# Patient Record
Sex: Female | Born: 1965 | Race: White | Marital: Single
Health system: Northeastern US, Community
[De-identification: ages and names within clinical notes are randomized; demographics above are authoritative.]

## PROBLEM LIST (undated history)

## (undated) DIAGNOSIS — F419 Anxiety disorder, unspecified: Secondary | ICD-10-CM

## (undated) DIAGNOSIS — R51 Headache: Secondary | ICD-10-CM

## (undated) DIAGNOSIS — R102 Pelvic and perineal pain: Secondary | ICD-10-CM

## (undated) DIAGNOSIS — R519 Headache, unspecified: Secondary | ICD-10-CM

## (undated) DIAGNOSIS — J45909 Unspecified asthma, uncomplicated: Secondary | ICD-10-CM

## (undated) DIAGNOSIS — T7840XA Allergy, unspecified, initial encounter: Secondary | ICD-10-CM

## (undated) HISTORY — PX: CHOLECYSTECTOMY: SHX55

## (undated) HISTORY — DX: Allergy, unspecified, initial encounter: T78.40XA

## (undated) HISTORY — PX: CERVICAL BIOPSY  W/ LOOP ELECTRODE EXCISION: SUR135

## (undated) HISTORY — PX: PELVIC LAPAROSCOPY: SHX162

---

## 2000-01-31 ENCOUNTER — Other Ambulatory Visit: Admission: RE | Admit: 2000-01-31 | Discharge: 2000-01-31 | Payer: Self-pay | Admitting: Gynecology

## 2001-02-28 ENCOUNTER — Other Ambulatory Visit: Admission: RE | Admit: 2001-02-28 | Discharge: 2001-02-28 | Payer: Self-pay | Admitting: Gynecology

## 2002-11-23 ENCOUNTER — Inpatient Hospital Stay (HOSPITAL_COMMUNITY): Admission: EM | Admit: 2002-11-23 | Discharge: 2002-11-27 | Payer: Self-pay | Admitting: Internal Medicine

## 2002-11-23 ENCOUNTER — Encounter: Payer: Self-pay | Admitting: Internal Medicine

## 2002-11-25 ENCOUNTER — Encounter: Payer: Self-pay | Admitting: Internal Medicine

## 2002-12-06 ENCOUNTER — Encounter: Payer: Self-pay | Admitting: Internal Medicine

## 2002-12-06 ENCOUNTER — Ambulatory Visit (HOSPITAL_COMMUNITY): Admission: RE | Admit: 2002-12-06 | Discharge: 2002-12-06 | Payer: Self-pay | Admitting: Internal Medicine

## 2002-12-06 ENCOUNTER — Inpatient Hospital Stay (HOSPITAL_COMMUNITY): Admission: AD | Admit: 2002-12-06 | Discharge: 2002-12-09 | Payer: Self-pay | Admitting: Internal Medicine

## 2002-12-09 ENCOUNTER — Encounter: Payer: Self-pay | Admitting: Internal Medicine

## 2003-01-21 ENCOUNTER — Other Ambulatory Visit: Admission: RE | Admit: 2003-01-21 | Discharge: 2003-01-21 | Payer: Self-pay | Admitting: Obstetrics and Gynecology

## 2003-01-21 ENCOUNTER — Encounter: Payer: Self-pay | Admitting: Internal Medicine

## 2003-01-21 ENCOUNTER — Ambulatory Visit (HOSPITAL_COMMUNITY): Admission: RE | Admit: 2003-01-21 | Discharge: 2003-01-21 | Payer: Self-pay | Admitting: Internal Medicine

## 2004-03-11 ENCOUNTER — Other Ambulatory Visit: Admission: RE | Admit: 2004-03-11 | Discharge: 2004-03-11 | Payer: Self-pay | Admitting: Gynecology

## 2005-03-24 ENCOUNTER — Other Ambulatory Visit: Admission: RE | Admit: 2005-03-24 | Discharge: 2005-03-24 | Payer: Self-pay | Admitting: Gynecology

## 2005-05-05 ENCOUNTER — Ambulatory Visit (HOSPITAL_COMMUNITY): Admission: RE | Admit: 2005-05-05 | Discharge: 2005-05-05 | Payer: Self-pay | Admitting: Gynecology

## 2005-05-05 ENCOUNTER — Encounter (INDEPENDENT_AMBULATORY_CARE_PROVIDER_SITE_OTHER): Payer: Self-pay | Admitting: Specialist

## 2005-05-05 ENCOUNTER — Ambulatory Visit (HOSPITAL_BASED_OUTPATIENT_CLINIC_OR_DEPARTMENT_OTHER): Admission: RE | Admit: 2005-05-05 | Discharge: 2005-05-05 | Payer: Self-pay | Admitting: Gynecology

## 2006-02-28 ENCOUNTER — Ambulatory Visit (HOSPITAL_COMMUNITY): Admission: RE | Admit: 2006-02-28 | Discharge: 2006-02-28 | Payer: Self-pay | Admitting: Gynecology

## 2006-03-16 ENCOUNTER — Encounter: Admission: RE | Admit: 2006-03-16 | Discharge: 2006-03-16 | Payer: Self-pay | Admitting: Gynecology

## 2006-03-27 ENCOUNTER — Other Ambulatory Visit: Admission: RE | Admit: 2006-03-27 | Discharge: 2006-03-27 | Payer: Self-pay | Admitting: Gynecology

## 2006-07-18 ENCOUNTER — Encounter: Admission: RE | Admit: 2006-07-18 | Discharge: 2006-07-18 | Payer: Self-pay | Admitting: Internal Medicine

## 2006-08-07 ENCOUNTER — Encounter: Admission: RE | Admit: 2006-08-07 | Discharge: 2006-08-07 | Payer: Self-pay | Admitting: Internal Medicine

## 2006-09-12 ENCOUNTER — Ambulatory Visit (HOSPITAL_COMMUNITY): Admission: RE | Admit: 2006-09-12 | Discharge: 2006-09-12 | Payer: Self-pay | Admitting: Surgery

## 2006-09-12 ENCOUNTER — Encounter (INDEPENDENT_AMBULATORY_CARE_PROVIDER_SITE_OTHER): Payer: Self-pay | Admitting: *Deleted

## 2007-01-12 ENCOUNTER — Ambulatory Visit (HOSPITAL_COMMUNITY): Admission: RE | Admit: 2007-01-12 | Discharge: 2007-01-12 | Payer: Self-pay | Admitting: Gynecology

## 2007-05-03 ENCOUNTER — Other Ambulatory Visit: Admission: RE | Admit: 2007-05-03 | Discharge: 2007-05-03 | Payer: Self-pay | Admitting: Gynecology

## 2008-06-05 ENCOUNTER — Ambulatory Visit: Payer: Self-pay | Admitting: Gynecology

## 2008-07-29 ENCOUNTER — Ambulatory Visit: Payer: Self-pay | Admitting: Gynecology

## 2008-07-29 ENCOUNTER — Other Ambulatory Visit: Admission: RE | Admit: 2008-07-29 | Discharge: 2008-07-29 | Payer: Self-pay | Admitting: Gynecology

## 2008-07-29 ENCOUNTER — Encounter: Payer: Self-pay | Admitting: Gynecology

## 2008-10-01 ENCOUNTER — Ambulatory Visit: Payer: Self-pay | Admitting: Gynecology

## 2009-01-14 ENCOUNTER — Ambulatory Visit: Payer: Self-pay | Admitting: Gynecology

## 2009-03-17 ENCOUNTER — Ambulatory Visit: Payer: Self-pay | Admitting: Gynecology

## 2009-07-21 ENCOUNTER — Ambulatory Visit: Payer: Self-pay | Admitting: Gynecology

## 2009-12-21 ENCOUNTER — Ambulatory Visit: Payer: Self-pay | Admitting: Gynecology

## 2009-12-30 ENCOUNTER — Ambulatory Visit: Payer: Self-pay | Admitting: Gynecology

## 2010-10-17 ENCOUNTER — Encounter: Payer: Self-pay | Admitting: Gynecology

## 2011-02-11 NOTE — Op Note (Signed)
[name], [name] NO.:  [id number]   MEDICAL RECORD NO.:  [id number]          PATIENT TYPE:  AMB   LOCATION:  DAY                          FACILITY:  [facility]   PHYSICIAN:  Ardeth Sportsman, MD     DATE OF BIRTH:  1965-11-01   DATE OF PROCEDURE:  09/12/2006  DATE OF DISCHARGE:                               OPERATIVE REPORT   SURGEON:  Ardeth Sportsman, M.D.   ASSISTANT:  Lebron Conners, M.D.   PRIMARY CARE PHYSICIAN:  Olene Craven, M.D.   PREOPERATIVE DIAGNOSIS:  Biliary dyskinesia.   POSTOPERATIVE DIAGNOSIS:  Biliary dyskinesia.   OPERATION/PROCEDURE:  Laparoscopic cholecystectomy.   SPECIMENS:  Gallbladder.   DRAINS:  None.   ESTIMATED BLOOD LOSS:  Minimal.   COMPLICATIONS:  None.   ANESTHESIA:  1. General.  2. Local anesthetic (0.25% bupivacaine without epinephrine) in a field      block around all port sites.   INDICATIONS:  [name] is a 45 year old female with classic story  for biliary colic and evidence of decreased gallbladder ejection  fraction on HIDA scan but no stones on ultrasound.  The anatomy and  physiology of hepatobiliary and pancreatic function was discussed.  Pathophysiology of biliary dyskinesia and risks of natural history,  risks of dehydration, etc., were discussed.  Recommendation was made for  a laparoscopic cholecystectomy.  Technique was explained in detail.  Because of the patient's allergies to shellfish, I do not think it is  safe to do a cholangiogram on her without a stronger indication and,  therefore, I decided to hold off on doing that.   Risks such as stroke, heart attack, deep venous thrombosis, pulmonary  embolism and death were discussed.  Risks such as bleeding and  transfusion, wound infection, abscess, injury to other organs, prolong  pain, incisional hernia were discussed.  Risks of bile duct injury  resulting in the need for internal/external drainage and/or endoscopy,  endoscopic or  operative treatment or reconstruction were discussed.  She  understood these risks, questions answered and she wished to proceed.   OPERATIVE FINDINGS:  She had a boggy dilated gallbladder.  Normal  appearing biliary anatomy.  She had some mild adhesions to the  gallbladder from the omentum but no strong evidence of cholecystitis.   DESCRIPTION OF PROCEDURE:  Informed consent was confirmed.  The patient  underwent general anesthesia without difficulty.  She was positioned  supine with both arms tucked.  Her abdomen was prepped and draped in the  sterile fashion.   Entry was gained into the abdomen using the Veress technique.  The  Veress needle passed through an infraumbilical incision as an umbilical  stalk held the fashion for counter traction.  Capno-peritoneum was  insufflated to 15 mmHg to good result.  A 5 mm port was passed.  Camera  inspection revealed no intra-abdominal injury.  Under direct  visualization, a 10 mm port was placed in the subxiphoid region and 5 mm  ports were placed in the right mid abdomen x2.   Gallbladder fundus was grasped and elevated cephalad.  There were some  omental attachments that were freed  up using careful cautery and blunt  dissection.  The peritoneal covering of the gallbladder onto the liver  was free off on the anteromedial and posterolateral aspects.  Circumferential dissection was done until there were only three  structures leaving the gallbladder, going down to the porta hepatis. Two  were pulsatile and came together in a Y, consistent with anterior and  posterior branches of the cystic artery.  One clip on the gallbladder  side and two clips slightly posterior were made on these structures and  they were transected.  This  remained one last structure consistent with  the critical view of the cystic duct. One clip on the gallbladder side  and three clips slightly proximally were made on the cystic duct and it  was transected.  Gallbladder  was freed from its remaining attachments to  delivery or remove out the subxiphoid port without difficulty.  Camera  inspection revealed no major bleeding.  Hemostasis was maintained.  Clips were inspected and there was no evidence of any bleeding or leak  of bile.  The upper three abdominal ports were removed with no evidence  of bleeding from the peritoneum. Capno-peritoneum was evacuated.  The  umbilical port was removed.  The fascial defect of the subxiphoid region  was too small to allow my pinky to pass and, therefore, did not, and  plus it was buried within the falciform ligaments and I felt it did not  require closure.  Therefore, the skin was closed using 4-0 Monocryl  stitch. Sterile dressings applied. The patient was extubated and sent to  the recovery room in stable condition.   I explained the operative findings and postoperative plan in detail to  the patient's mother.  Numerous questions were answered and she  expressed understanding and appreciation.      Ardeth Sportsman, MD  Electronically Signed     SCG/MEDQ  D:  09/12/2006  T:  09/12/2006  Job:  [id number]   cc:   Olene Craven, M.D.  Fax: [phone]

## 2011-02-11 NOTE — H&P (Signed)
NAMECYNDEE, GIAMMARCO         ACCOUNT NO.:  [id number]   MEDICAL RECORD NO.:  [id number]          PATIENT TYPE:  AMB   LOCATION:  [facility]                         FACILITY:  [facility]   PHYSICIAN:  Timothy P. Fontaine, M.D.DATE OF BIRTH:  09-17-1966   DATE OF ADMISSION:  03/10/2005  DATE OF DISCHARGE:                                HISTORY & PHYSICAL   Being admitted to [facility] March 10, 2005 at 7:30 for  surgery.   CHIEF COMPLAINT:  Hydrosalpinx.   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  The patient has been followed with  serial ultrasonography over the past 2 years with the persistent of a  tubular cystic structure in the left adnexa, which has progressively become  more prominent over the last two scans.  She had a history of intermittent  pelvic pain although currently is without discomfort and is having regular  menses.  The patient is admitted at this time for diagnostic laparoscopy and  possible salpingo-oophorectomy.  The patient does have a past history of a  normal CA-125.   PAST MEDICAL HISTORY:  Uncomplicated.   PAST SURGICAL HISTORY:  LEEP in 1996.   ALLERGIES:  Include SULFA DRUGS and PERCOCET, which cause hives.   CURRENT MEDICATIONS:  1.  Nexium.  2.  Multivitamins.   REVIEW OF SYSTEMS:  Noncontributory.   FAMILY HISTORY:  Noncontributory.   SOCIAL HISTORY:  Noncontributory.   ADMISSION PHYSICAL EXAMINATION:  VITAL SIGNS:  Afebrile.  The vital signs  are stable.  HEENT:  Normal.  LUNGS:  Clear.  CARDIAC:  Regular rate without rubs, murmurs or gallops.  ABDOMEN:  Exam is benign.  PELVIC:  External BUS and vagina normal.  Cervix is normal.  Uterus normal  size, midline, mobile and nontender.  Adnexa are without masses or  tenderness.   ASSESSMENT:  A 45 year old G 1, P 0, AB 1 female with persistent  tubulocystic structure in the left adnexa, most likely  representing  hydrosalpinx.  The patient had a normal CA-125 in the past.  This area has  persisted over 2 years and seems to be slightly enlarging over time.  The  options for continued expectant management versus interventional therapy  were reviewed, and the patient would prefer to proceed with surgery.  I  reviewed what is involved with laparoscopic surgery to include the expected  intraoperative courses.  Insufflation, trocar placement, multiple port  sites, use of sharp, blunt dissection, electrocautery and laser were all  reviewed with her.  The risks of inadvertent injury to internal organs, both  immediately recognized and delayed recognized to include bowel, bladder,  ureters, vessels and nerves necessitating major exploratory reparative  surgeries and future reparative surgeries including ostomy formation was all  discussed, understood and accepted.  The risk of major vessel injury leading  to hemorrhage and the risk of transfusion were reviewed to include  transfusion reaction, hepatitis, HIV, mad cow disease and other unknown  entities.  The risk of infection, both internal requiring prolonged  antibiotics, as well as  incisional requiring opening and draining of  incisions, was all discussed, understood and accepted.  The various possible  intraoperative scenarios were reviewed with the patient and the issues, if  indeed this is a clear hydrosalpinx, whether we should proceed with excision  now or not.  She is not actively trying to get pregnant at this time but  will be over the next year, and I reviewed with her that if indeed she has a  clear hydrosalpinx the potential detrimental effects of this both on  spontaneous pregnancies as well as on in vitro pregnancies was discussed.  Also the issue as to what the other tube looks like on the right was also  reviewed.  If she has a clear hydro on the left and a normal-appearing tube  on the right, then I think it would be most  prudent to go ahead and remove  the left fallopian tube, and she wants me to do so.  If the right side looks  damaged but open, and there is a clear hydro on the left, she again wants me  to remove the left fallopian tube.  If there is not complete blockage but  damage to the tube, we are able to free and mobilize the fallopian tube and  restore somewhat of a normal anatomy, recognizing that if still is abnormal  then she would prefer to maintain the tube for possible future use, although  again she understands that it certainly may be dysfunctional and she may be  subsequently requiring future surgeries.  After a very lengthy discussion,  the patient stated that she leaves this decision up to me intraoperatively.  She understands the various issues.  She understands there may not be a  clear answer as to whether to take or leave the fallopian tube and that she  leaves that decision totally up to me, and that she would abide by my  decision and respect my decision postoperatively.  The patient also  understands there are no guarantees as far as longterm pregnancy issues, and  she understands and accepts this also.  The patient's questions are answered  to her satisfaction.  She is ready to proceed with surgery.       TPF/MEDQ  D:  03/07/2005  T:  03/07/2005  Job:  [id number]

## 2011-02-11 NOTE — Discharge Summary (Signed)
   NAME:  [name], [name]                   ACCOUNT NO.:  [id number]   MEDICAL RECORD NO.:  [id number]                   PATIENT TYPE:  INP   LOCATION:  5705                                 FACILITY:  [facility]   PHYSICIAN:  Olene Craven, M.D.            DATE OF BIRTH:  19-Apr-1966   DATE OF ADMISSION:  12/06/2002  DATE OF DISCHARGE:  12/09/2002                                 DISCHARGE SUMMARY   DISCHARGE DIAGNOSES:  1. Intraparenchymal multiple renal abscesses.  2. Anemia.   DISCHARGE MEDICATIONS:  1. Augmentin XR two tablets p.o. b.i.d. x 14 days.  2. Iron sulfate 325 mg p.o. b.i.d.   CONSULTS:  None.   FOLLOW-UP:  The patient is to follow up in two weeks' time with Olene Craven, M.D.   HOSPITAL COURSE:  The patient was admitted on December 06, 2002, after  experiencing continued flank discomfort and fever.  She had previously been  admitted to [facility] for pyelonephritis and was  discharged home with continued symptoms of pain and fever.  She was seen as  an outpatient for CT scan which showed intraparenchymal renal abscesses.  She was admitted for IV antibiotics.  A repeat CT scan indicated marked  improvement in the size of the abscesses and she was discharged in stable  condition with resolution of her fever.  She consented to have a course of  Augment as above and follow up in two weeks' time for a repeat CT scan to  make sure there was total resolution of her symptoms.  She is to follow up  as above.  Discharged in stable condition.                                               Olene Craven, M.D.    DEH/MEDQ  D:  01/13/2003  T:  01/13/2003  Job:  [id number]

## 2011-02-11 NOTE — H&P (Signed)
[name], [name]         ACCOUNT NO.:  [id number]   MEDICAL RECORD NO.:  [id number]          PATIENT TYPE:  AMB   LOCATION:  [facility]                         FACILITY:  [facility]   PHYSICIAN:  Timothy P. Fontaine, M.D.DATE OF BIRTH:  1966-04-26   DATE OF ADMISSION:  05/05/2005  DATE OF DISCHARGE:                                HISTORY & PHYSICAL   CHIEF COMPLAINT:  Hydrosalpinx.   HISTORY OF PRESENT ILLNESS:  A 45 year old G1 P0 female with a long history  of probable left hydrosalpinx. The patient has been followed with serial  ultrasonography over the past 2 years with persistence of a tubular cystic  structure in the left adnexa which has progressively become more prominent  over the last 2 years. She has a history of intermittent pelvic pain,  although currently is without discomfort.  She is having regular menses. The  patient is admitted at this time for diagnostic laparoscopy, possible  salpingo-oophorectomy. The patient does have a history of a normal CA-125.   PAST MEDICAL HISTORY:  Uncomplicated.   PAST SURGICAL HISTORY:  LEEP in 1996.   ALLERGIES:  1.  SULFA DRUGS.  2.  PERCOCET which causes hives.   CURRENT MEDICATIONS:  1.  Nexium.  2.  Multivitamins.   REVIEW OF SYSTEMS:  Noncontributory.   FAMILY HISTORY:  Noncontributory.   SOCIAL HISTORY:  Noncontributory.   PHYSICAL EXAMINATION:  VITAL SIGNS:  Afebrile. Vital signs were stable.  HEENT:  Normal.  LUNGS:  Clear.  CARDIAC:  Regular rate without rubs, murmurs or gallops.  ABDOMEN:  Benign.  PELVIC:  External BUS, vagina normal. Cervix normal. Uterus normal size,  midline and mobile. Adnexa without masses or tenderness.   ASSESSMENT:  A 45 year old G1, P0, AB1 female with persistent tubular cystic  structure, left adnexa, most likely representing hydrosalpinx. The patient  had a normal CA-125 in the past. This area has persisted over 2 years and  seems to be slightly enlarging over time. The options  of continued expectant  management versus interventional therapy were reviewed, and the patient  would prefer to proceed with surgery.   I reviewed what is involved with laparoscopic surgery to include the  expected intraoperative courses, insufflation, trocar placement, multiple  port sites, use of sharp blunt dissection, electrocautery, and laser were  all reviewed with her. The risks of inadvertent injury to internal organs,  both immediately recognized and delay recognized to include bowel, bladder,  ureters, vessels and nerves necessitating major exploratory reparative  surgeries and future reparative surgeries including ostomy formation was all  discussed, understood, accepted. The risks of major vessel injury leading to  hemorrhage and the risks of transfusion were reviewed to include transfusion  reaction, hepatitis, HIV, mad cow disease, and other unknown entities. The  risks of infection, both internal requiring prolonged antibiotics, as well  as incisional requiring opening and draining of incisions, were all  discussed, understood, accepted. The various possible intraoperative  scenarios were reviewed with the patient, and the issues, if indeed this is  a clear hydrosalpinx, whether we should proceed with excision now or not was  discussed. She is not actively  trying to get pregnant at this time, but will  over the next year. I reviewed with her that if indeed she has a clear  hydrosalpinx, the potential for detrimental effects of this both on  spontaneous pregnancies, as well as in vitro pregnancies, was discussed.  Also, the issue as to what the other tube looks like on the right was  reviewed. If she has a clear hydro on the left and a normal-appearing tube  on the right, then I think it would be most prudent to go ahead and remove  the left fallopian tube, and she wants to do so. If the right side looks  damaged but open, and there is a clear hydro on the left, she again  wants me  to remove the left fallopian tube. If there is not complete blockage but  damage to the tube, and we are able to free immobilize the fallopian tube  and restore somewhat normal anatomy, recognizing it may still be abnormal,  then she would prefer to maintain the tube for future possible use, although  again she understands that it certainly may be dysfunctional, and she may be  requiring future surgeries. After a lengthy discussion, the patient stated  she leaves this decision up to me intraoperatively.  She understands the  various issues. She understands there may not be a clear answer as to  whether to take or leave the fallopian tube, and she leaves this decision  totally up to me, and would abide by my decision and respect my decision  postoperatively. The patient also understands there are no guarantees as far  as long-term pregnancy issues. She understands and accepts this also. The  patient's questions were answered to her satisfaction. She is ready to  proceed with surgery.       TPF/MEDQ  D:  04/25/2005  T:  04/25/2005  Job:  [id number]

## 2011-02-11 NOTE — Op Note (Signed)
[name], [name]         ACCOUNT NO.:  [id number]   MEDICAL RECORD NO.:  [id number]          PATIENT TYPE:  AMB   LOCATION:  [facility]                         FACILITY:  [facility]   PHYSICIAN:  Timothy P. Fontaine, M.D.DATE OF BIRTH:  1966-05-22   DATE OF PROCEDURE:  05/05/2005  DATE OF DISCHARGE:                                 OPERATIVE REPORT   PREOPERATIVE DIAGNOSES:  1.  Left hydrosalpinx.  2.  Pelvic pain.   POSTOPERATIVE DIAGNOSES:  1.  Bilateral hydrosalpinx.  2.  Bilateral sidewall adhesions.  3.  Posterior cul-de-sac endometriosis.  4.  Left ovarian cysts x2.   PROCEDURE:  Laparoscopic lysis of adhesions, left distal salpingostomy,  biopsy fulguration endometriosis, left ovarian cystectomy x2,  chromopertubation.   SURGEON:  Timothy P. Fontaine, M.D.   ASSISTANT:  Scrub technician.   ANESTHESIA:  General endotracheal.   COMPLICATIONS:  None.   SPECIMEN:  1.  Peritoneal biopsy.  2.  Left ovarian cyst x2.   FINDINGS:  Anterior cul-de-sac normal. Posterior cul-de-sac with subtle  brown powder burn lesions, multiple across the posterior cul-de-sac between  the uterosacral ligaments. All appear to be superficial in nature.  Representative biopsy taken. All visible areas fulgurated with bipolar  cautery. The uterus normal size, shape, contour. Left fallopian tube with  distal hydrosalpinx. Tube otherwise free and mobile. Normal caliber with the  exception of the distal hydro which ended in a bulbous area. Right fallopian  tubes normal caliber and length with a distal hydrosalpinx, nondistended. No  fimbria visualized bilaterally. Right ovary grossly normal, adherent to the  right pelvic sidewall by thick adhesive band. Left ovary with two extruding  capsular cysts; one simple in appearance, the other pigmented and dark, rule  out endometrioma. Both the areas were excised. Ovary adherent to the left  pelvic sidewall with thick adhesive bands. Upper abdominal exam  without  gross evidence of disease. Appendix was grossly normal, free and mobile.  Liver smooth, no abnormalities. Gallbladder was not visualized. At the end  of the procedure, the left distal hydrosalpinx was opened and there was free  spill of dye on chromopertubation. The right hydro was left undisturbed and  was without spillage of dye.   PROCEDURE:  The patient was taken to the operating room, underwent general  anesthesia, was placed in low dorsal lithotomy position. Sheath and  abdominal/perineal/vaginal preparation with Betadine solution. Bladder  emptied with in-and-out Foley catheterization. EUA performed. Hulka  tenaculum placed on the cervix. The patient was draped in usual fashion. A  vertical infraumbilical incision was made. Veress needle placed, position  verified with water and approximately 2-1/2 L of carbon dioxide gas infused  without difficulty. The 10 mm laparoscopic trocar was then placed without  difficulty, its position verified visually. Right and left 5 mm suprapubic  ports were then placed under direct visualization after transillumination of  the vessels without difficulty. Examination of pelvic organs was then  carried out with findings noted above. Initially using the tripolar  instrument, the thick adhesive bands were lysed bilaterally involving the  ovaries and the sidewalls to allow mobilization of the adnexa bilaterally.  Examination  of the right fallopian tube showed a hydrosalpinx without gross  dilatation and inspection of the left fallopian tube showed the bulbous  dilated terminal portion of the hydrosalpinx. At this point, as per prior  discussion with the patient, the various options were available to include  terminating the procedure at this time versus proceeding with a  salpingectomy or an attempted salpingostomy. It was decided at this point to  proceed with a distal salpingostomy. Prior to this, the two ovarian cysts  that were extruding  from the left ovarian capsule on this pedunculated type  fashion were excised with the tripolar and sent to pathology. The fallopian  tube was then stabilized to expose the distal hydrosalpinx using the needle  tip unipolar. The hydro was incised in a cross fashion relieving the  pressure and clear fluid extruded. The leaflets of the incision points were  then welded back using the cautery to maintain the distal opening. At this  point, the posterior cul-de-sac was reinspected and multiple small  superficial powder burn type endometriotic implants were noted. A  representative biopsy was taken and all visible areas were fulgurated. At  this point, the Hulka tenaculum was replaced by the Cohen cannula.  Chromopertubation was performed and there was clear fill and spill of dye  from the left fallopian tube. The right fallopian tube showed mild filling  with distension and no spillage. At this point, Interceed was placed to  cover the posterior cul-de-sac biopsy fulguration areas, as was both adnexa  covered with Interceed. The suprapubic ports were then removed, the gas  allowed to escape. All areas reinspected under low pressure situation  showing adequate hemostasis. The infraumbilical port was then backed out  under direct visualization showing adequate hemostasis. No evidence of  hernia formation. A 0 Vicryl subcutaneous fascial stitch was placed  infraumbilically. All port sites injected using 0.25% Marcaine and all port  sites closed using Dermabond skin adhesive. The Cohen cannula was removed.  The patient placed in supine position, awakened without difficulty, and  taken to recovery room in good condition having tolerated the procedure  well.       TPF/MEDQ  D:  05/05/2005  T:  05/05/2005  Job:  [id number]

## 2011-02-11 NOTE — Discharge Summary (Signed)
[name], [name]                   ACCOUNT NO.:  [id number]   MEDICAL RECORD NO.:  [id number]                   PATIENT TYPE:  INP   LOCATION:  5531                                 FACILITY:   PHYSICIAN:  Tracy Searing. Leander Rams, M.D.                DATE OF BIRTH:  1966-01-04   DATE OF ADMISSION:  11/23/2002  DATE OF DISCHARGE:  11/27/2002                                 DISCHARGE SUMMARY   DISCHARGE DIAGNOSES:  1. Pyelonephritis, resolved.  2. Depression, controlled.  3. Normocytic anemia - required outpatient follow up.   MEDICATIONS:  Zoloft 150 mg p.o. daily, Tequin 400 mg p.o. daily x11 days,  Vicodin 1-2 p.o. q.4-6h p.r.n. pain.   ALLERGIES:  Sulfa.   CONSULTATIONS:  None.   PROCEDURE:  None.   HISTORY OF PRESENT ILLNESS:  This 45 year old female with a one-week history  of low back pain and abdominal pain awoke on the day of presentation with  fever, chills.  Went to walk-in clinic at [facility] and was sent to [facility] for direct admission for suspected pyelonephritis.  The patient was  noted to have leukocytes in her urine aw protein and some hematuria and had  an elevated white blood count of 19.1 thousand, and a fever of 100.2.  The  patient was admitted to a medical bed for further treatment of  pyelonephritis.   HOSPITAL COURSE:  The patient was initially treated with Rocephin IV in the  emergency department, she received a total of three days of IV Rocephin, was  then changed over to Tequin p.o. which she tolerated well.  After 48 hours  on p.o. Tequin she is afebrile and pain free and her white blood cell count  had decreased from 19.1 thousand to 11.7 thousand.  She will continue Tequin  to complete a 14 days course of total antibiotic therapy and have her urine  rechecked in 14 days time.  An incidental finding on this admission includes  normocytic anemia, the etiology of this is unclear.  Discussed further work  up with patient, she agrees to  have this done as an outpatient.  Made  recommendations for diet and vitamin therapy until this can be completed.  She is asymptomatic for her anemia and does not have any signs or symptoms  of blood loss.   DISCHARGE LABS:  White blood cell count 11,700, hemoglobin 9.7, hematocrit  29.1, platelet count 307,000.  Sodium 138, potassium 4.1, BUN 14, creatinine  1.0, serum glucose is 14.  Hemoglobin A1C is 5.5.  TSH 1.458.  Blood  cultures are negative x2 for 48 hours, final results are pending.   CONDITION ON DISCHARGE:  Good.    DISPOSITION:  Discharged to home.  Follow up is to be with her physician in  14 days time for a follow up UA and beginning of normocytic anemia work up.      Tracy Hose. Earlene Plater, [place].P.  Tracy Searing. Leander Rams, M.D.    SMD/MEDQ  D:  11/27/2002  T:  11/27/2002  Job:  [id number]

## 2012-08-30 ENCOUNTER — Other Ambulatory Visit: Payer: Self-pay | Admitting: Chiropractic Medicine

## 2012-08-30 ENCOUNTER — Ambulatory Visit
Admission: RE | Admit: 2012-08-30 | Discharge: 2012-08-30 | Disposition: A | Discharge status: Home / Self Care | Payer: 59 | Source: Ambulatory Visit | Attending: Chiropractic Medicine | Admitting: Chiropractic Medicine

## 2012-08-30 DIAGNOSIS — M542 Cervicalgia: Secondary | ICD-10-CM

## 2012-09-03 ENCOUNTER — Ambulatory Visit: Payer: Self-pay

## 2012-09-12 ENCOUNTER — Ambulatory Visit: Payer: Self-pay | Admitting: Women's Health

## 2012-12-10 ENCOUNTER — Ambulatory Visit: Payer: 59 | Admitting: Women's Health

## 2014-01-29 ENCOUNTER — Other Ambulatory Visit: Payer: Self-pay

## 2014-01-29 DIAGNOSIS — Z1231 Encounter for screening mammogram for malignant neoplasm of breast: Secondary | ICD-10-CM

## 2014-02-04 ENCOUNTER — Ambulatory Visit
Admission: RE | Admit: 2014-02-04 | Discharge: 2014-02-04 | Disposition: A | Discharge status: Home / Self Care | Payer: 59 | Source: Ambulatory Visit

## 2014-02-04 ENCOUNTER — Encounter (INDEPENDENT_AMBULATORY_CARE_PROVIDER_SITE_OTHER): Payer: Self-pay

## 2014-02-04 DIAGNOSIS — Z1231 Encounter for screening mammogram for malignant neoplasm of breast: Secondary | ICD-10-CM

## 2014-02-05 ENCOUNTER — Other Ambulatory Visit: Payer: Self-pay | Admitting: Family Medicine

## 2014-02-05 DIAGNOSIS — R928 Other abnormal and inconclusive findings on diagnostic imaging of breast: Secondary | ICD-10-CM

## 2014-02-19 ENCOUNTER — Ambulatory Visit (INDEPENDENT_AMBULATORY_CARE_PROVIDER_SITE_OTHER): Payer: 59 | Admitting: Women's Health

## 2014-02-19 ENCOUNTER — Other Ambulatory Visit: Payer: 59

## 2014-02-19 ENCOUNTER — Other Ambulatory Visit (HOSPITAL_COMMUNITY)
Admission: RE | Admit: 2014-02-19 | Discharge: 2014-02-19 | Disposition: A | Discharge status: Home / Self Care | Payer: 59 | Source: Ambulatory Visit | Attending: Women's Health | Admitting: Women's Health

## 2014-02-19 ENCOUNTER — Encounter: Payer: Self-pay | Admitting: Women's Health

## 2014-02-19 VITALS — BP 110/68 | Ht 67.0 in | Wt 120.0 lb

## 2014-02-19 DIAGNOSIS — Z124 Encounter for screening for malignant neoplasm of cervix: Secondary | ICD-10-CM

## 2014-02-19 DIAGNOSIS — Z87410 Personal history of cervical dysplasia: Secondary | ICD-10-CM

## 2014-02-19 DIAGNOSIS — Z1151 Encounter for screening for human papillomavirus (HPV): Secondary | ICD-10-CM | POA: Insufficient documentation

## 2014-02-19 DIAGNOSIS — Z01419 Encounter for gynecological examination (general) (routine) without abnormal findings: Secondary | ICD-10-CM | POA: Insufficient documentation

## 2014-02-19 NOTE — Progress Notes (Signed)
Patient ID: [name], female   DOB: 1966-04-23, 48 y.o.   MRN: [id number] Presents for followup. Last seen in the office in 2009, normal Pap. 07/2013 LEEP in [facility] unsure of Pap results will have records sent. Has had an annual exam at primary care since  back in [place] with normal labs. Mammogram 01/2014 normal after ultrasound. 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 Not sexually active greater than one year.  Exam: Appears well. External genitalia within normal limits, speculum exam vaginal atrophy, Pap taken cervical os stenotic visible discharge, erythema. Bimanual no CMT or tenderness.  07/2013 LEEP followup Vaginal atrophy  Plan: Instructed to follow up to have Pap records sent, Pap with HR HPV typing pending. Vaginal lubricants with intercourse.

## 2014-02-19 NOTE — Addendum Note (Signed)
Addended by: Berna Spare A on: 02/19/2014 03:39 PM   Modules accepted: Orders

## 2014-02-24 ENCOUNTER — Encounter: Payer: Self-pay | Admitting: Gynecology

## 2014-05-16 ENCOUNTER — Emergency Department (HOSPITAL_BASED_OUTPATIENT_CLINIC_OR_DEPARTMENT_OTHER)
Admission: RE | Admit: 2014-05-16 | Disposition: A | Discharge status: Home / Self Care | Payer: Self-pay | Source: Emergency Department | Attending: Emergency Medicine | Admitting: Emergency Medicine

## 2014-05-16 LAB — XR ANKLE RIGHT MINIMUM 3 VIEWS

## 2014-05-16 MED ORDER — IBUPROFEN 600 MG PO TABS
600.00 mg | ORAL_TABLET | Freq: Once | ORAL | Status: AC
Start: 2014-05-16 — End: 2014-05-16
  Administered 2014-05-16: 600 mg via ORAL
  Filled 2014-05-16: qty 1

## 2014-05-16 MED ORDER — OXYCODONE-ACETAMINOPHEN 5-325 MG PO TABS
1.00 | ORAL_TABLET | Freq: Once | ORAL | Status: AC
Start: 2014-05-16 — End: 2014-05-16
  Administered 2014-05-16: 1 via ORAL
  Filled 2014-05-16: qty 1

## 2014-05-16 MED ORDER — SODIUM CHLORIDE 0.9 % IV BOLUS
1000.00 mL | Freq: Once | INTRAVENOUS | Status: AC
Start: 2014-05-16 — End: 2014-05-16
  Administered 2014-05-16: 1000 mL via INTRAVENOUS

## 2014-05-16 MED ORDER — OXYCODONE HCL 5 MG PO TABS
5.00 mg | ORAL_TABLET | ORAL | Status: AC | PRN
Start: 2014-05-16 — End: 2014-05-21

## 2014-05-16 MED ORDER — OXYCODONE HCL 5 MG PO TABS
5.0000 mg | ORAL_TABLET | Freq: Once | ORAL | Status: DC
Start: 2014-05-16 — End: 2014-05-16

## 2014-05-16 MED ORDER — IBUPROFEN 600 MG PO TABS
600.0000 mg | ORAL_TABLET | Freq: Three times a day (TID) | ORAL | Status: AC | PRN
Start: 2014-05-16 — End: 2014-06-15

## 2014-05-16 NOTE — Discharge Instructions (Signed)
Thank you for visiting the [facility] Emergency Department.   We have not found any emergent physical exam or lab abnormalities that would require admission to the hospital today.     Many people who come to the ER do not leave with a specific diagnosis at the end of their visit. In the ER we try to make sure that there is no emergent problem that needs inpatient treatment right now.  This does not mean that your evaluation is complete--please be sure to follow up with your regular doctor as additional testing as an outpatient may be indicated.    Diagnosis:  Closed fibular fracture, unspecified laterality, initial encounter  (primary encounter diagnosis)    What we did:  - Xray of right ankle which showed a distal fibula fracture    Next Steps:   - Follow up with your primary care provider when you return home  - Alternate doses of Tylenol (acetaminophen) with Motrin/Advil (ibuprofen) to control pain.  Wait 6 hours between the SAME kind of medication.  An example schedule follows:     6:00am   Motrin/Advil (ibuprofen)  600 MG as prescribed, TAKE WITH FOOD!     9:00am   Tylenol (acetaminophen)  975 MG = 3 REGULAR strength tablets     12:00noon   Motrin/Advil     3:00pm   Tylenol     6:00pm   Motrin/Advil     9:00pm   Tylenol     etcetera  - Oxycodone for pain not well-controlled by above. NO WORK, NO ALCOHOL, NO DRIVING after taking!!!  - Please return to the ED for any worsening symptoms.    If you had a CT scan or X-Ray performed between the hours of 5 PM-7 AM your results are only preliminary.  Final CT and X-Ray reports will be available   Fibular Fracture, Adult, Treated Without Immobilization  You have a fracture (break) of your fibula. This is the bone in your lower leg located on the outside of the leg. These fractures are easily diagnosed with x-rays.  TREATMENT   You have a simple fracture of the very bottom part of the fibula. This bone usually will heal without problems and can often be treated without  casting or splinting. This means the fracture will heal well during normal use and daily activities without being held in place. Sometimes a cast or splint is placed on these fractures if it is needed for comfort or if the bones are badly out of place.  HOME CARE INSTRUCTIONS    Apply ice to the injury for 15-20 minutes, 03-04 times per day while awake, for 2 days. Put the ice in a plastic bag and place a thin towel between the bag of ice and your leg. This helps keep swelling down.   Use crutches. Do not bear weight on the leg until you see an orthopedist    Only take over-the-counter or prescription medicines for pain, discomfort, or fever as directed by your caregiver.   Your caregiver may tell you to take off your removable cam boot.   Keep appointments for follow up X-rays if these are required.   Warning: Do not drive a car or operate a motor vehicle until your caregiver specifically tells you it is safe to do so.  SEEK MEDICAL CARE IF:    You have continued severe pain or more swelling.   The medications do not control the pain.   Your skin or nails below the injury turn  blue or grey or feel cold or numb.   You develop severe pain in the leg or foot.  MAKE SURE YOU:    Understand these instructions.   Will watch your condition.   Will get help right away if you are not doing well or get worse.  Document Released: 09/12/2005 Document Revised: 12/05/2011 Document Reviewed: 04/18/2008  ExitCare Patient Information 2014 [employer] [place]  through medical records within 24 hours.   Sometimes incidental findings are found on your radiographs after you leave the ED that will require non-emergent  follow up.

## 2014-05-16 NOTE — Narrator Note (Signed)
Pt reported of feeling slightly better with the cast on

## 2014-05-16 NOTE — Narrator Note (Signed)
Return to room

## 2014-05-16 NOTE — Narrator Note (Signed)
Pt d/c home, d/c instructions/ prescription given, to f/u with PCP and Podiatry.Pt verbalized understanding about discharge intructions.

## 2014-05-16 NOTE — Narrator Note (Signed)
To XRay via stretcher

## 2014-05-16 NOTE — ED Triage Note (Signed)
Pt states she missed 2 steps and landed on right ankle. Swelling noted. +CSM. Ice apply.

## 2014-05-16 NOTE — Narrator Note (Signed)
Medicated as ordered, pt is making arrangements to fly back home today

## 2014-05-17 NOTE — ED Provider Notes (Signed)
I have reviewed the ED nursing notes and prior records. I have reviewed the patient's past medical history/problem list, allergies, social history and medication list.  I saw this patient primarily.    HPI:  This patient is a 48 year old female with no significant PMH who presents s/p injury to R ankle. Mechanism of injury was mis-step off stairs - was carrying luggage down stairs, wearing high heels and landed wrong, everting the ankle. Immediate severe pain, unable to bear weight. No other injuries or somatic complaints. Very tearful on arrival. 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     ROS: Pertinent positives were reviewed as per the HPI above. All other systems were reviewed and are negative.    Past Medical History/Problem List:  No past medical history on file.  There is no problem list on file for this patient.      Past Surgical History:  No past surgical history on file.    Medications:     No current facility-administered medications on file prior to encounter.  No current outpatient prescriptions on file prior to encounter.    Social History:     Smoking status: Not on file    Smokeless tobacco: Not on file    Alcohol Use: Not on file       Allergies:  Review of Patient's Allergies indicates:  No Known Allergies    Physical Exam:  BP 101/51   Pulse 89   Temp(Src) 97.3 F   Resp 16   Wt 51.71 kg (114 lb)   SpO2 99%  GENERAL: Very anxious, tearful  SKIN:  Warm & Dry, swelling to R lateral ankle  HEAD:  Atraumatic.  NECK:  Non tender  MUSCULOSKELETAL:  Normal cap refill. R ankle with lateral swelling, very tender to palpation over lateral malleolus. No other deformity. Knee normal.   NEUROLOGIC:  Alert & oriented x 3, Normal speech, Normal strength  PSYCHIATRIC:  Anxious affect    ED Course and Medical Decision-making:    The patient is 48 year old female s/p injury to R ankle found to have a distal fibular avulsion fracture. After percocet,  patient became dizzya nd hypotensive, ? Med effect vs. Vagal. Given IVF, feels better. Hadn't eaten all day, able to eat a powerbar.    Stable fracture, no need for splint immobilization. Tried to place air case which she did not tolerate. Wrapped ankle with ace wrap and provided crutches, instructed to remain non-weight bearing until she can follow up [place]. Patient plans to fly tonight.     Reasons to return to the ED were reviewed in detail. The patient agrees with this plan and disposition.    Disposition:  Home    Diagnosis/Diagnoses:  Closed fibular fracture, unspecified laterality, initial encounter  (primary encounter diagnosis)      Debbie SantosAlice Irwin 

## 2014-05-21 ENCOUNTER — Telehealth: Payer: Self-pay | Admitting: *Deleted

## 2014-05-21 ENCOUNTER — Encounter: Payer: Self-pay | Admitting: Women's Health

## 2014-05-21 ENCOUNTER — Ambulatory Visit (INDEPENDENT_AMBULATORY_CARE_PROVIDER_SITE_OTHER): Payer: 59 | Admitting: Women's Health

## 2014-05-21 DIAGNOSIS — N63 Unspecified lump in unspecified breast: Secondary | ICD-10-CM

## 2014-05-21 DIAGNOSIS — N6320 Unspecified lump in the left breast, unspecified quadrant: Secondary | ICD-10-CM

## 2014-05-21 NOTE — Telephone Encounter (Signed)
Pt called and left message in voicemail c/o left breast lump. I called pt back and told her to make OV.

## 2014-05-21 NOTE — Progress Notes (Signed)
Patient ID: [name], female   DOB: July 16, 1966, 48 y.o.   MRN: [id number] Presents with complaint of palpable nodule left outer aspect of breast 2 days ago. Normal mammogram 01/2014 after diagnostic mammogram at [facility] on left breast. Great Aunt history of breast cancer. History of premature ovarian failure on no HRT. Gravida 0.  Broke right tibia this week with traumatic fall, right lower leg in cast. Denies injury to left breast, fall was on the right side.  Exam: Appears well, breast exam sitting and lying position no  visible dimpling, nipple discharge, right breast within normal limits, no palpable nodules. Left breast outer aspect at 3:00 1 cm BB size mobile nontender nodule palpated.  Left breast nodule  Plan: Diagnostic mammogram of left breast. Will get scheduled.

## 2014-05-21 NOTE — Telephone Encounter (Signed)
appointment on 05/23/14 @ 9:15 am at [facility], order faxed

## 2014-05-21 NOTE — Telephone Encounter (Signed)
Message copied by Aura Camps on Wed May 21, 2014  3:24 PM ------      Message from: [place], [place] J      Created: Wed May 21, 2014  3:07 PM       Please schedule diagnostic mammogram on left beast. 1 cm mobile non tender nodule outer aspect 1 inch from acerola, noticed for 2 days. Breast center or [facility]  Where ever first available, has had mammogram at both ------

## 2014-05-23 ENCOUNTER — Encounter: Payer: Self-pay | Admitting: Women's Health

## 2014-06-05 ENCOUNTER — Other Ambulatory Visit: Payer: Self-pay | Admitting: Gastroenterology

## 2014-07-28 ENCOUNTER — Encounter: Payer: Self-pay | Admitting: Women's Health

## 2016-03-18 ENCOUNTER — Emergency Department (HOSPITAL_COMMUNITY)
Admission: EM | Admit: 2016-03-18 | Discharge: 2016-03-18 | Disposition: A | Discharge status: Home / Self Care | Payer: Worker's Compensation | Attending: Emergency Medicine | Admitting: Emergency Medicine

## 2016-03-18 DIAGNOSIS — Y929 Unspecified place or not applicable: Secondary | ICD-10-CM | POA: Diagnosis not present

## 2016-03-18 DIAGNOSIS — Z5321 Procedure and treatment not carried out due to patient leaving prior to being seen by health care provider: Secondary | ICD-10-CM | POA: Diagnosis not present

## 2016-03-18 DIAGNOSIS — S3992XA Unspecified injury of lower back, initial encounter: Secondary | ICD-10-CM | POA: Insufficient documentation

## 2016-03-18 DIAGNOSIS — Y990 Civilian activity done for income or pay: Secondary | ICD-10-CM | POA: Diagnosis not present

## 2016-03-18 DIAGNOSIS — X58XXXA Exposure to other specified factors, initial encounter: Secondary | ICD-10-CM | POA: Insufficient documentation

## 2016-03-18 DIAGNOSIS — Y939 Activity, unspecified: Secondary | ICD-10-CM | POA: Diagnosis not present

## 2016-03-18 NOTE — ED Notes (Signed)
Pt reports injurying herself at work 03/01/2016, pt seen at [facility] for eval & d/c care with the facility & [facility] but has not been able get in at this time, pt has continued with PT & felt that she overdone it yesterday, pt had heat therapy today at the PT office & came her for further eval, pt ambulatory, pt denies urine & bowel incontinence, A&O x4

## 2016-03-18 NOTE — ED Provider Notes (Signed)
Patient left AMA prior to evaluation by myself or another provider. No signatures were able to be obtained by the staff prior to the patient leaving.  Tracy PancoastShawn C Joy, PA-C 03/18/16 1835  Pricilla LovelessScott Goldston, MD 03/20/16 1500

## 2016-03-18 NOTE — ED Notes (Signed)
The pt injured her entire back 1-3 weeks ago at work  She is being followed by [employer]  And the doctors she was referred to  Are not helping her.. She was unable to sleep last pm  And she last had ibuprofen around 1000 that did not help her pain

## 2016-03-18 NOTE — ED Notes (Signed)
The pt was in the room ans after registration saw her she came from the room crying and reported that she was tired   Of telling everyone her entire astory aND SHE JUST NEEDED TO LEAVE.  SHE LEFT UPSET

## 2016-04-21 ENCOUNTER — Ambulatory Visit (INDEPENDENT_AMBULATORY_CARE_PROVIDER_SITE_OTHER): Payer: 59 | Admitting: Women's Health

## 2016-04-21 ENCOUNTER — Encounter: Payer: Self-pay | Admitting: Women's Health

## 2016-04-21 VITALS — BP 118/80 | Ht 67.0 in | Wt 122.0 lb

## 2016-04-21 DIAGNOSIS — E559 Vitamin D deficiency, unspecified: Secondary | ICD-10-CM

## 2016-04-21 DIAGNOSIS — Z01419 Encounter for gynecological examination (general) (routine) without abnormal findings: Secondary | ICD-10-CM | POA: Diagnosis not present

## 2016-04-21 DIAGNOSIS — Z1329 Encounter for screening for other suspected endocrine disorder: Secondary | ICD-10-CM

## 2016-04-21 DIAGNOSIS — N912 Amenorrhea, unspecified: Secondary | ICD-10-CM | POA: Diagnosis not present

## 2016-04-21 LAB — TSH: TSH: 1 m[IU]/L

## 2016-04-21 NOTE — Patient Instructions (Signed)

## 2016-04-21 NOTE — Progress Notes (Signed)
[name] 07/13/1966 [id number]    History:    Presents for annual exam.  Premature ovarian failure using Plant estrogen cream that she buys over the Internet. Endometriosis. 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 History of LEEP in the past, normal Paps after History of a right breast cyst normal mammograms overdue. 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 History of anxiety and depression seeing a therapist for counseling and medication in [place] where she flies out of for work, possibly moving for a job promotion.  Past medical history, past surgical history, family history and social history were all reviewed and documented in the EPIC chart.  ROS:  A ROS was performed and pertinent positives and negatives are included.  Exam:  Vitals:   04/21/16 1037  BP: 118/80    General appearance:  Normal Thyroid:  Symmetrical, normal in size, without palpable masses or nodularity. Respiratory  Auscultation:  Clear without wheezing or rhonchi Cardiovascular  Auscultation:  Regular rate, without rubs, murmurs or gallops  Edema/varicosities:  Not grossly evident Abdominal  Soft,nontender, without masses, guarding or rebound.  Liver/spleen:  No organomegaly noted  Hernia:  None appreciated  Skin  Inspection:  Grossly normal   Breasts: Examined lying and sitting.     Right: Without masses, retractions, discharge or axillary adenopathy.     Left: Without masses, retractions, discharge or axillary adenopathy. Gentitourinary   Inguinal/mons:  Normal without inguinal adenopathy  External genitalia:  Normal  BUS/Urethra/Skene's glands:  Normal  Vagina:  Normal  Cervix:  Normal  Uterus:   normal in size, shape and contour.  Midline and mobile  Adnexa/parametria:     Rt: Without masses or tenderness.   Lt: Without masses or tenderness.  Anus and perineum: Normal  Digital rectal exam: Normal sphincter tone without palpated masses or  tenderness  Assessment/Plan:  50 y.o. S WF G0  for annual exam.  History of premature ovarian failure on plan based estrogen from the Internet Back injury-work related in physical therapy LEEP years ago records not available normal Paps after Anxiety/depression-therapist in [place] for counseling and meds  Plan: Reviewed plan based estrogen although low dose needs to have a progesterone to counteract declines, reviewed risks of blood clots, strokes, breast cancer. SBE's, continue annual screening mammogram overdue instructed to schedule, 3-D tomography reviewed and encouraged history of density. Exercise when able, calcium rich diet, vitamin D 1000 daily encouraged. Has had numerous labs at primary care, will check vitamin D, TSH, FSH, estradiol and Pap. Last Pap 2015 with negative HR HPV.    Harrington Challenger [facility], 5:35 PM 04/21/2016

## 2016-04-22 LAB — VITAMIN D 25 HYDROXY (VIT D DEFICIENCY, FRACTURES): VIT D 25 HYDROXY: 62 ng/mL (ref 30–100)

## 2016-04-22 LAB — PAP IG W/ RFLX HPV ASCU

## 2016-04-26 LAB — FOLLICLE STIMULATING HORMONE: FSH: 105.9 m[IU]/mL

## 2016-04-26 LAB — ESTRADIOL

## 2016-05-11 ENCOUNTER — Encounter: Payer: Self-pay | Admitting: Women's Health

## 2016-11-15 DIAGNOSIS — J329 Chronic sinusitis, unspecified: Secondary | ICD-10-CM | POA: Diagnosis not present

## 2016-11-17 ENCOUNTER — Other Ambulatory Visit: Payer: Self-pay | Admitting: Family Medicine

## 2016-11-17 DIAGNOSIS — J329 Chronic sinusitis, unspecified: Secondary | ICD-10-CM

## 2016-11-24 ENCOUNTER — Ambulatory Visit
Admission: RE | Admit: 2016-11-24 | Discharge: 2016-11-24 | Disposition: A | Discharge status: Home / Self Care | Payer: 59 | Source: Ambulatory Visit | Attending: Family Medicine | Admitting: Family Medicine

## 2016-11-24 DIAGNOSIS — J329 Chronic sinusitis, unspecified: Secondary | ICD-10-CM | POA: Diagnosis not present

## 2016-12-14 DIAGNOSIS — J4530 Mild persistent asthma, uncomplicated: Secondary | ICD-10-CM | POA: Diagnosis not present

## 2016-12-14 DIAGNOSIS — R0981 Nasal congestion: Secondary | ICD-10-CM | POA: Diagnosis not present

## 2017-01-28 DIAGNOSIS — N390 Urinary tract infection, site not specified: Secondary | ICD-10-CM | POA: Diagnosis not present

## 2017-01-31 DIAGNOSIS — N390 Urinary tract infection, site not specified: Secondary | ICD-10-CM | POA: Diagnosis not present

## 2017-01-31 DIAGNOSIS — R309 Painful micturition, unspecified: Secondary | ICD-10-CM | POA: Diagnosis not present

## 2017-02-03 ENCOUNTER — Other Ambulatory Visit: Payer: Self-pay | Admitting: Family Medicine

## 2017-02-03 ENCOUNTER — Ambulatory Visit
Admission: RE | Admit: 2017-02-03 | Discharge: 2017-02-03 | Disposition: A | Discharge status: Home / Self Care | Payer: 59 | Source: Ambulatory Visit | Attending: Family Medicine | Admitting: Family Medicine

## 2017-02-03 DIAGNOSIS — R109 Unspecified abdominal pain: Secondary | ICD-10-CM

## 2017-02-03 DIAGNOSIS — N390 Urinary tract infection, site not specified: Secondary | ICD-10-CM

## 2017-02-03 DIAGNOSIS — R1010 Upper abdominal pain, unspecified: Secondary | ICD-10-CM | POA: Diagnosis not present

## 2017-02-03 MED ORDER — IOPAMIDOL (ISOVUE-300) INJECTION 61%
100.0000 mL | Freq: Once | INTRAVENOUS | Status: AC | PRN
  Administered 2017-02-03: 100 mL via INTRAVENOUS

## 2017-02-06 ENCOUNTER — Other Ambulatory Visit: Payer: Self-pay | Admitting: Women's Health

## 2017-02-06 ENCOUNTER — Ambulatory Visit (INDEPENDENT_AMBULATORY_CARE_PROVIDER_SITE_OTHER): Payer: 59

## 2017-02-06 ENCOUNTER — Ambulatory Visit (INDEPENDENT_AMBULATORY_CARE_PROVIDER_SITE_OTHER): Payer: 59 | Admitting: Women's Health

## 2017-02-06 ENCOUNTER — Encounter: Payer: Self-pay | Admitting: Women's Health

## 2017-02-06 VITALS — BP 118/74

## 2017-02-06 DIAGNOSIS — N83201 Unspecified ovarian cyst, right side: Secondary | ICD-10-CM

## 2017-02-06 DIAGNOSIS — R140 Abdominal distension (gaseous): Secondary | ICD-10-CM

## 2017-02-06 DIAGNOSIS — N83202 Unspecified ovarian cyst, left side: Secondary | ICD-10-CM | POA: Diagnosis not present

## 2017-02-06 DIAGNOSIS — N9489 Other specified conditions associated with female genital organs and menstrual cycle: Secondary | ICD-10-CM

## 2017-02-06 DIAGNOSIS — N949 Unspecified condition associated with female genital organs and menstrual cycle: Secondary | ICD-10-CM

## 2017-02-06 MED ORDER — IBUPROFEN 600 MG PO TABS: 600.0000 mg | ORAL_TABLET | Freq: Three times a day (TID) | ORAL | 1 refills | Status: DC | PRN

## 2017-02-06 NOTE — Progress Notes (Signed)
Presents with complaint of low abdominal cramping, bloating, some nausea, back pain, changes in bowel elimination. States does not feel well, tired and feels like something is not right. Pain/pressure/bloating present for 2-3 weeks, discomfort at its worst is 8, but usually 3-4 achy discomfort always. Was seen by primary last week had a CT scan showing 2 ovarian cysts 3 cm., 4 cm. History of premature ovarian failure has been amenorrheic greater than 6 years. History of endometriosis and ovarian cysts in the past. 2010  lt hydrosalpinx. Not sexually active. 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 History of a back injury with whiplash from a flight had been out on workman's comp last year., [place]. Denies urinary symptoms,  vaginal discharge or fever.  Exam: Appears well with some discomfort. Ultrasound: T/V anteverted heterogeneous uterus with focal area possible adenomyosis 23 x 17 x 23 mm. Right ovary and left ovary normal with positive arterial blood flow seen. Right and left adnexal thin walled cystic mass echo-free serpentine shaped with septations. folds on itself negative vascular flow,  right 58 x 24 x 30 mm and left 60 x 39 x 50 mm. Negative cul-de-sac.  Postmenopausal Bilateral hydrosalpinx Probable adenomyosis Abdominal/pelvic pain  Plan: CA-125 pending. Would like to proceed with a BSO due to the discomfort. Will schedule surgical consult  with Dr. Audie BoxFontaine

## 2017-02-07 LAB — CA 125: CA 125: 13 U/mL (ref <35)

## 2017-02-14 ENCOUNTER — Telehealth: Payer: Self-pay

## 2017-02-14 NOTE — Telephone Encounter (Addendum)
Patient is schedule for lap bso for 03/16/17.  Her mother told her last night that she is pretty sure that her maternal great grandmother died of ovarian cancer. Patient wanted to make you aware that she "had this history in my lineage". Her CA125 was normal.  She asked does this change anything?  (I think she meant did we need to try to schedule surgery sooner.)

## 2017-02-15 NOTE — Telephone Encounter (Signed)
Does not change anything but add why it is good we are proceeding with the surgery.

## 2017-02-15 NOTE — Telephone Encounter (Signed)
I emailed patient in My Chart.

## 2017-02-28 ENCOUNTER — Encounter: Payer: Self-pay | Admitting: Gynecology

## 2017-03-03 DIAGNOSIS — N390 Urinary tract infection, site not specified: Secondary | ICD-10-CM | POA: Diagnosis not present

## 2017-03-08 ENCOUNTER — Encounter: Payer: Self-pay | Admitting: Gynecology

## 2017-03-08 ENCOUNTER — Other Ambulatory Visit: Payer: Self-pay | Admitting: Gynecology

## 2017-03-08 ENCOUNTER — Encounter (HOSPITAL_BASED_OUTPATIENT_CLINIC_OR_DEPARTMENT_OTHER): Payer: Self-pay | Admitting: *Deleted

## 2017-03-08 ENCOUNTER — Telehealth: Payer: Self-pay

## 2017-03-08 ENCOUNTER — Ambulatory Visit (INDEPENDENT_AMBULATORY_CARE_PROVIDER_SITE_OTHER): Payer: 59 | Admitting: Gynecology

## 2017-03-08 VITALS — BP 116/74

## 2017-03-08 DIAGNOSIS — R102 Pelvic and perineal pain: Secondary | ICD-10-CM | POA: Diagnosis not present

## 2017-03-08 DIAGNOSIS — N7011 Chronic salpingitis: Secondary | ICD-10-CM | POA: Diagnosis not present

## 2017-03-08 MED ORDER — MISOPROSTOL 200 MCG PO TABS: ORAL_TABLET | ORAL | 0 refills | Status: DC

## 2017-03-08 MED ORDER — OXYCODONE-ACETAMINOPHEN 5-325 MG PO TABS: 1.0000 | ORAL_TABLET | Freq: Four times a day (QID) | ORAL | 0 refills | Status: DC | PRN

## 2017-03-08 NOTE — H&P (Signed)
[name] 02/19/1966 [id number]   History and Physical  Chief complaint: Pelvic pain, bilateral hydrosalpinx  History of present illness: 51 y.o. G2P0020 with history of low back pain over the last month or so with pelvic cramping discomfort that comes and goes. Had CT scan which showed " The ovaries are enlarged bilaterally with multiple cysts. Largest cyst on the right measures 3.8 cm. Largest on the left 4.5 cm."  No evidence of ascites or adenopathy.  Had follow up ultrasound which shows uterus grossly normal with questionable adenomyosis. Endometrial echoes 2.7 mm. Right and left ovaries grossly normal. Right adnexa with 58 x 24 x 30 mm serpentine echo-free avascular cystic mass. Left adnexa with 60 x 39 x 50 mm serpentine echo-free cystic mass. Both consistent with hydrosalpinx. CA-125 13. [facility]  History of laparoscopy 2006 for pelvic pain with bilateral distal hydrosalpinx and bilateral sidewall adhesions. Both adnexa were freed at the end of the procedure. Posterior cul-de-sac superficial endometriosis also noted. Patient is admitted at this time for laparoscopic bilateral salpingo-oophorectomies.  Past medical history,surgical history, medications, allergies, family history and social history were all reviewed and documented in the EPIC chart.  ROS:  Was performed and pertinent positives and negatives are included in the history of present illness.  Exam:  Kennon PortelaKim Love assistant Vitals:   03/08/17 1005  BP: 116/74   General: well developed, well nourished female, no acute distress HEENT: normal  Lungs: clear to auscultation without wheezing, rales or rhonchi  Cardiac: regular rate without rubs, murmurs or gallops  Abdomen: soft, nontender without masses, guarding, rebound, organomegaly  Pelvic: external bus vagina: normal   Cervix: grossly normal, atrophic in appearance with stenotic os. Uterus: normal size, midline and mobile, nontender   Adnexa: without masses or tenderness      Assessment/Plan:  51 y.o. G2P0020 with history as above for laparoscopic bilateral salpingo-oophorectomy. I reviewed the proposed surgery with the patient to include the expected intraoperative and postoperative courses as well as the recovery. She understands that we will initiate laparoscopic approach but that if we are unable to accomplish the surgery laparoscopically options to terminate at that time and reschedule an open laparotomy or consider robotic approach if felt feasible was reviewed. Patient feels that she would want to proceed with an open laparotomy at this time to accomplish the bilateral salpingo-oophorectomy if needed. She understands that it would mean a larger incision with a longer recovery period. I also reviewed given her past operative note if both ovaries appear normal but are firmly adherent to the sidewalls and it would require a much more extensive resection and that if we are able to remove both fallopian tubes which we believe are the source of her pain that we would proceed with this and leave both ovaries and she is comfortable with this approach accepting the risks of ovarian conservation in the future to include reoperation for ovarian disease both benign and malignant. She also understands although she has significant pathology demonstrated on ultrasound but there is no guarantee that removing the hydrosalpinx will relieve her pain and this may continue postoperatively. Although she is amenorrheic and has an elevated FSH I also reviewed that the ovaries may be contributing some hormonal production at age 51 and whether she would want to leave the ovaries regardless and she prefers if possible to remove both ovaries but also gives me permission to leave them if I felt removing them would involve a lot more dissection  and increased risk of her surgery. The risks of infection, prolonged antibiotics, reoperation for abscess or hematoma  formation was discussed. The risks of hemorrhage necessitating transfusion and the risks of transfusion reaction, hepatitis, HIV, mad cow disease and other unknown entities was also discussed. Incisional complications to include opening and draining of incisions and closure by secondary intention, dehiscence and long-term issues of keloid/cosmetics and hernia formation were reviewed. The risk of inadvertent injury to internal organs including bowel, bladder, ureters, vessels, nerves either immediately recognized or delay recognized necessitating major exploratory reparative surgeries and future reparative surgeries including bowel resection, ostomy formation, bladder repair, ureteral damage repair was discussed with her. The patient's questions were answered to her satisfaction and she is ready to proceed with surgery.    Dara Lords MD, 11:02 AM 03/08/2017

## 2017-03-08 NOTE — Telephone Encounter (Signed)
Patient informed.Rx ready for pick up at front desk

## 2017-03-08 NOTE — Telephone Encounter (Signed)
Okay for Percocet 5.0 #15 one by mouth every 6 hours when necessary pain

## 2017-03-08 NOTE — Patient Instructions (Signed)
Followup for surgery as scheduled. 

## 2017-03-08 NOTE — Progress Notes (Signed)
[name] [birth year] [id number]   Preoperative consult  Chief complaint: Pelvic pain, bilateral hydrosalpinx  History of present illness: 51 y.o. G2P0020 with history of low back pain over the last month or so with pelvic cramping discomfort that comes and goes. Had CT scan which showed " The ovaries are enlarged bilaterally with multiple cysts. Largest cyst on the right measures 3.8 cm. Largest on the left 4.5 cm."  No evidence of ascites or adenopathy.  Had follow up ultrasound which shows uterus grossly normal with questionable adenomyosis. Endometrial echoes 2.7 mm. Right and left ovaries grossly normal. Right adnexa with 58 x 24 x 30 mm serpentine echo-free avascular cystic mass. Left adnexa with 60 x 39 x 50 mm serpentine echo-free cystic mass. Both consistent with hydrosalpinx. CA-125 13. Amenorrhea times years with [facility] 2017 105.  History of laparoscopy 2006 for pelvic pain with bilateral distal hydrosalpinx and bilateral sidewall adhesions. Both adnexa were freed at the end of the procedure. Posterior cul-de-sac superficial endometriosis also noted. Patient is admitted at this time for laparoscopic bilateral salpingo-oophorectomies.  Past medical history,surgical history, medications, allergies, family history and social history were all reviewed and documented in the EPIC chart.  ROS:  Was performed and pertinent positives and negatives are included in the history of present illness.  Exam:  [name] assistant Vitals:   03/08/17 1005  BP: 116/74   General: well developed, well nourished female, no acute distress HEENT: normal  Lungs: clear to auscultation without wheezing, rales or rhonchi  Cardiac: regular rate without rubs, murmurs or gallops  Abdomen: soft, nontender without masses, guarding, rebound, organomegaly  Pelvic: external bus vagina: normal   Cervix: grossly normal, atrophic in appearance with stenotic os. Uterus: normal size, midline and mobile, nontender   Adnexa: without masses or tenderness      Assessment/Plan:  51 y.o. G2P0020 with history as above for laparoscopic bilateral salpingo-oophorectomy. I reviewed the proposed surgery with the patient to include the expected intraoperative and postoperative courses as well as the recovery. She understands that we will initiate laparoscopic approach but that if we are unable to accomplish the surgery laparoscopically options to terminate at that time and reschedule an open laparotomy or consider robotic approach if felt feasible was reviewed. Patient feels that she would want to proceed with an open laparotomy at this time to accomplish the bilateral salpingo-oophorectomy if needed. She understands that it would mean a larger incision with a longer recovery period. I also reviewed given her past operative note if both ovaries appear normal but are firmly adherent to the sidewalls and it would require a much more extensive resection and that if we are able to remove both fallopian tubes which we believe are the source of her pain that we would proceed with this and leave both ovaries and she is comfortable with this approach accepting the risks of ovarian conservation in the future to include reoperation for ovarian disease both benign and malignant. She also understands although she has significant pathology demonstrated on ultrasound but there is no guarantee that removing the hydrosalpinx will relieve her pain and this may continue postoperatively. Although she is amenorrheic and has an elevated FSH I also reviewed that the ovaries may be contributing some hormonal production at age 51 and whether she would want to leave the ovaries regardless and she prefers if possible to remove both ovaries but also gives me permission to leave them if I felt removing them would involve a lot more dissection and  increased risk of her surgery. The risks of infection, prolonged antibiotics, reoperation for abscess or hematoma  formation was discussed. The risks of hemorrhage necessitating transfusion and the risks of transfusion reaction, hepatitis, HIV, mad cow disease and other unknown entities was also discussed. Incisional complications to include opening and draining of incisions and closure by secondary intention, dehiscence and long-term issues of keloid/cosmetics and hernia formation were reviewed. The risk of inadvertent injury to internal organs including bowel, bladder, ureters, vessels, nerves either immediately recognized or delay recognized necessitating major exploratory reparative surgeries and future reparative surgeries including bowel resection, ostomy formation, bladder repair, ureteral damage repair was discussed with her. The patient's questions were answered to her satisfaction and she is ready to proceed with surgery.     [name],Garwood Wentzell P MD, 10:40 AM 03/08/2017

## 2017-03-08 NOTE — Progress Notes (Signed)
Pt instructed npo pmn 6/20 x welbutrin w sip of water.  [facility]  Needs cbc, bmet.  Pt to come Thursday or Friday this week for labs,

## 2017-03-08 NOTE — Telephone Encounter (Signed)
I called patient and informed her of need for Cytotec tab vaginally hs before surgery.  Warned her she might feel some menstrual like cramping overnight or see spotting or light bleeding and this is okay and just part of the med working.  Rx will be sent.  Also, patient asked if Dr. Velvet BatheF could go ahead and prescribe pain medication and any other post op Rx's prior to surgery so she can have them ahead of time. Patient said she is single and 51 yo Mom going to be driving and taking care of her and she hates to have to send her out to pharmacy if she could get it ahead of time.  Can even wait until next week if you need her to.

## 2017-03-13 ENCOUNTER — Telehealth: Payer: Self-pay | Admitting: *Deleted

## 2017-03-13 ENCOUNTER — Other Ambulatory Visit: Payer: 59

## 2017-03-13 DIAGNOSIS — Z01818 Encounter for other preprocedural examination: Secondary | ICD-10-CM

## 2017-03-13 LAB — CBC
HCT: 38.3 % (ref 35.0–45.0)
Hemoglobin: 12.3 g/dL (ref 11.7–15.5)
MCH: 29.1 pg (ref 27.0–33.0)
MCHC: 32.1 g/dL (ref 32.0–36.0)
MCV: 90.8 fL (ref 80.0–100.0)
MPV: 10.4 fL (ref 7.5–12.5)
PLATELETS: 295 10*3/uL (ref 140–400)
RBC: 4.22 MIL/uL (ref 3.80–5.10)
RDW: 14 % (ref 11.0–15.0)
WBC: 4.7 10*3/uL (ref 3.8–10.8)

## 2017-03-13 LAB — BASIC METABOLIC PANEL
BUN: 9 mg/dL (ref 7–25)
CALCIUM: 9 mg/dL (ref 8.6–10.2)
CO2: 27 mmol/L (ref 20–31)
Chloride: 107 mmol/L (ref 98–110)
Creat: 0.83 mg/dL (ref 0.50–1.10)
GLUCOSE: 96 mg/dL (ref 65–99)
Potassium: 4 mmol/L (ref 3.5–5.3)
Sodium: 142 mmol/L (ref 135–146)

## 2017-03-13 NOTE — Telephone Encounter (Signed)
Pt scheduled for surgery on 03/16/17 for laparoscopic needs pre-op labs coming to office today to pick up Rx, asked if labs could be done here for bmet/cbc. Pt coming today at 12:00 noon new orders placed.

## 2017-03-14 NOTE — Progress Notes (Signed)
Message from Dr Reynold BowenFontaine's office -lab work completed(CBC,BMET) in [facility]-results in epic.

## 2017-03-16 ENCOUNTER — Ambulatory Visit (HOSPITAL_BASED_OUTPATIENT_CLINIC_OR_DEPARTMENT_OTHER): Payer: 59 | Admitting: Anesthesiology

## 2017-03-16 ENCOUNTER — Encounter (HOSPITAL_BASED_OUTPATIENT_CLINIC_OR_DEPARTMENT_OTHER): Payer: Self-pay | Admitting: *Deleted

## 2017-03-16 ENCOUNTER — Encounter (HOSPITAL_BASED_OUTPATIENT_CLINIC_OR_DEPARTMENT_OTHER)
Admission: RE | Disposition: A | Discharge status: Home / Self Care | Payer: Self-pay | Source: Ambulatory Visit | Attending: Gynecology

## 2017-03-16 ENCOUNTER — Ambulatory Visit (HOSPITAL_BASED_OUTPATIENT_CLINIC_OR_DEPARTMENT_OTHER)
Admission: RE | Admit: 2017-03-16 | Discharge: 2017-03-16 | Disposition: A | Discharge status: Home / Self Care | Payer: 59 | Source: Ambulatory Visit | Attending: Gynecology | Admitting: Gynecology

## 2017-03-16 DIAGNOSIS — N838 Other noninflammatory disorders of ovary, fallopian tube and broad ligament: Secondary | ICD-10-CM | POA: Diagnosis not present

## 2017-03-16 DIAGNOSIS — N7011 Chronic salpingitis: Secondary | ICD-10-CM

## 2017-03-16 DIAGNOSIS — N8300 Follicular cyst of ovary, unspecified side: Secondary | ICD-10-CM | POA: Diagnosis not present

## 2017-03-16 DIAGNOSIS — N803 Endometriosis of pelvic peritoneum: Secondary | ICD-10-CM | POA: Diagnosis not present

## 2017-03-16 DIAGNOSIS — N809 Endometriosis, unspecified: Secondary | ICD-10-CM | POA: Diagnosis not present

## 2017-03-16 DIAGNOSIS — N882 Stricture and stenosis of cervix uteri: Secondary | ICD-10-CM | POA: Insufficient documentation

## 2017-03-16 DIAGNOSIS — R102 Pelvic and perineal pain: Secondary | ICD-10-CM | POA: Diagnosis not present

## 2017-03-16 DIAGNOSIS — R8569 Abnormal cytological findings in specimens from other digestive organs and abdominal cavity: Secondary | ICD-10-CM | POA: Diagnosis not present

## 2017-03-16 HISTORY — PX: LAPAROSCOPIC SALPINGO OOPHERECTOMY: SHX5927

## 2017-03-16 HISTORY — DX: Pelvic and perineal pain: R10.2

## 2017-03-16 SURGERY — SALPINGO-OOPHORECTOMY, LAPAROSCOPIC
Anesthesia: General | Site: Abdomen | Laterality: Bilateral

## 2017-03-16 MED ORDER — CEFOTETAN DISODIUM-DEXTROSE 2-2.08 GM-% IV SOLR
INTRAVENOUS | Status: AC
  Filled 2017-03-16: qty 50

## 2017-03-16 MED ORDER — KETOROLAC TROMETHAMINE 30 MG/ML IJ SOLN
INTRAMUSCULAR | Status: AC
  Filled 2017-03-16: qty 1

## 2017-03-16 MED ORDER — BUPIVACAINE HCL (PF) 0.25 % IJ SOLN
INTRAMUSCULAR | Status: DC | PRN
  Administered 2017-03-16: 10 mL

## 2017-03-16 MED ORDER — PROPOFOL 10 MG/ML IV BOLUS
INTRAVENOUS | Status: AC
  Filled 2017-03-16: qty 40

## 2017-03-16 MED ORDER — DEXTROSE 5 % IV SOLN
2.0000 g | INTRAVENOUS | Status: AC
  Administered 2017-03-16: 2 g via INTRAVENOUS
  Filled 2017-03-16: qty 2

## 2017-03-16 MED ORDER — MIDAZOLAM HCL 5 MG/5ML IJ SOLN
INTRAMUSCULAR | Status: DC | PRN
  Administered 2017-03-16: 2 mg via INTRAVENOUS

## 2017-03-16 MED ORDER — METOCLOPRAMIDE HCL 5 MG/ML IJ SOLN
10.0000 mg | Freq: Once | INTRAMUSCULAR | Status: DC | PRN
  Filled 2017-03-16: qty 2

## 2017-03-16 MED ORDER — FENTANYL CITRATE (PF) 100 MCG/2ML IJ SOLN
25.0000 ug | INTRAMUSCULAR | Status: DC | PRN
  Filled 2017-03-16: qty 1

## 2017-03-16 MED ORDER — FENTANYL CITRATE (PF) 100 MCG/2ML IJ SOLN
INTRAMUSCULAR | Status: AC
  Filled 2017-03-16: qty 2

## 2017-03-16 MED ORDER — LACTATED RINGERS IV SOLN
INTRAVENOUS | Status: DC
  Administered 2017-03-16 (×2): via INTRAVENOUS
  Filled 2017-03-16: qty 1000

## 2017-03-16 MED ORDER — KETOROLAC TROMETHAMINE 30 MG/ML IJ SOLN
INTRAMUSCULAR | Status: DC | PRN
  Administered 2017-03-16: 30 mg via INTRAVENOUS

## 2017-03-16 MED ORDER — ONDANSETRON HCL 4 MG/2ML IJ SOLN
INTRAMUSCULAR | Status: AC
  Filled 2017-03-16: qty 2

## 2017-03-16 MED ORDER — ACETAMINOPHEN 10 MG/ML IV SOLN
INTRAVENOUS | Status: DC | PRN
  Administered 2017-03-16: 1000 mg via INTRAVENOUS

## 2017-03-16 MED ORDER — LACTATED RINGERS IV SOLN
INTRAVENOUS | Status: DC
  Filled 2017-03-16: qty 1000

## 2017-03-16 MED ORDER — PROPOFOL 10 MG/ML IV BOLUS
INTRAVENOUS | Status: DC | PRN
  Administered 2017-03-16: 30 mg via INTRAVENOUS
  Administered 2017-03-16: 170 mg via INTRAVENOUS

## 2017-03-16 MED ORDER — ONDANSETRON HCL 4 MG/2ML IJ SOLN
INTRAMUSCULAR | Status: DC | PRN
  Administered 2017-03-16: 4 mg via INTRAVENOUS

## 2017-03-16 MED ORDER — SUGAMMADEX SODIUM 200 MG/2ML IV SOLN
INTRAVENOUS | Status: AC
  Filled 2017-03-16: qty 2

## 2017-03-16 MED ORDER — METOCLOPRAMIDE HCL 5 MG/ML IJ SOLN
INTRAMUSCULAR | Status: AC
  Filled 2017-03-16: qty 2

## 2017-03-16 MED ORDER — OXYCODONE-ACETAMINOPHEN 5-325 MG PO TABS: 1.0000 | ORAL_TABLET | ORAL | 0 refills | Status: DC | PRN

## 2017-03-16 MED ORDER — LIDOCAINE HCL 4 % MT SOLN
OROMUCOSAL | Status: DC | PRN
  Administered 2017-03-16: 2 mL via TOPICAL

## 2017-03-16 MED ORDER — LIDOCAINE 2% (20 MG/ML) 5 ML SYRINGE
INTRAMUSCULAR | Status: AC
  Filled 2017-03-16: qty 5

## 2017-03-16 MED ORDER — METOCLOPRAMIDE HCL 5 MG/ML IJ SOLN
INTRAMUSCULAR | Status: DC | PRN
  Administered 2017-03-16: 5 mg via INTRAVENOUS

## 2017-03-16 MED ORDER — DEXAMETHASONE SODIUM PHOSPHATE 10 MG/ML IJ SOLN
INTRAMUSCULAR | Status: AC
  Filled 2017-03-16: qty 1

## 2017-03-16 MED ORDER — ARTIFICIAL TEARS OPHTHALMIC OINT
TOPICAL_OINTMENT | OPHTHALMIC | Status: AC
  Filled 2017-03-16: qty 3.5

## 2017-03-16 MED ORDER — LACTATED RINGERS IR SOLN
Status: DC | PRN
  Administered 2017-03-16: 500 mL

## 2017-03-16 MED ORDER — MIDAZOLAM HCL 2 MG/2ML IJ SOLN
INTRAMUSCULAR | Status: AC
  Filled 2017-03-16: qty 2

## 2017-03-16 MED ORDER — ROCURONIUM BROMIDE 50 MG/5ML IV SOSY
PREFILLED_SYRINGE | INTRAVENOUS | Status: DC | PRN
  Administered 2017-03-16: 40 mg via INTRAVENOUS
  Administered 2017-03-16: 10 mg via INTRAVENOUS

## 2017-03-16 MED ORDER — DEXAMETHASONE SODIUM PHOSPHATE 4 MG/ML IJ SOLN
INTRAMUSCULAR | Status: DC | PRN
  Administered 2017-03-16: 10 mg via INTRAVENOUS

## 2017-03-16 MED ORDER — FENTANYL CITRATE (PF) 100 MCG/2ML IJ SOLN
INTRAMUSCULAR | Status: DC | PRN
  Administered 2017-03-16 (×3): 50 ug via INTRAVENOUS

## 2017-03-16 MED ORDER — LIDOCAINE 2% (20 MG/ML) 5 ML SYRINGE
INTRAMUSCULAR | Status: DC | PRN
  Administered 2017-03-16: 80 mg via INTRAVENOUS

## 2017-03-16 MED ORDER — SUGAMMADEX SODIUM 200 MG/2ML IV SOLN
INTRAVENOUS | Status: DC | PRN
  Administered 2017-03-16: 150 mg via INTRAVENOUS

## 2017-03-16 MED ORDER — MEPERIDINE HCL 25 MG/ML IJ SOLN
6.2500 mg | INTRAMUSCULAR | Status: DC | PRN
  Administered 2017-03-16: 6.25 mg via INTRAVENOUS
  Filled 2017-03-16 (×2): qty 1

## 2017-03-16 MED ORDER — ACETAMINOPHEN 10 MG/ML IV SOLN
INTRAVENOUS | Status: AC
  Filled 2017-03-16: qty 100

## 2017-03-16 MED ORDER — ROCURONIUM BROMIDE 50 MG/5ML IV SOSY
PREFILLED_SYRINGE | INTRAVENOUS | Status: AC
  Filled 2017-03-16: qty 5

## 2017-03-16 SURGICAL SUPPLY — 36 items
ADH SKN CLS APL DERMABOND .7 (GAUZE/BANDAGES/DRESSINGS)
BAG SPEC RTRVL LRG 6X4 10 (ENDOMECHANICALS) ×2
BARRIER ADHS 3X4 INTERCEED (GAUZE/BANDAGES/DRESSINGS) IMPLANT
BLADE SURG 15 STRL LF C SS BP (BLADE) ×1 IMPLANT
BLADE SURG 15 STRL SS (BLADE) ×2
BRR ADH 4X3 ABS CNTRL BYND (GAUZE/BANDAGES/DRESSINGS)
CABLE HIGH FREQUENCY MONO STRZ (ELECTRODE) IMPLANT
CATH ROBINSON RED A/P 16FR (CATHETERS) ×2 IMPLANT
CLOTH BEACON ORANGE TIMEOUT ST (SAFETY) ×2 IMPLANT
DERMABOND ADVANCED (GAUZE/BANDAGES/DRESSINGS)
DERMABOND ADVANCED .7 DNX12 (GAUZE/BANDAGES/DRESSINGS) IMPLANT
DILATOR CANAL MILEX (MISCELLANEOUS) ×1 IMPLANT
DURAPREP 26ML APPLICATOR (WOUND CARE) ×2 IMPLANT
GLOVE BIO SURGEON STRL SZ7.5 (GLOVE) ×4 IMPLANT
GLOVE BIOGEL PI IND STRL 7.0 (GLOVE) ×2 IMPLANT
GLOVE BIOGEL PI IND STRL 7.5 (GLOVE) ×1 IMPLANT
GLOVE BIOGEL PI INDICATOR 7.0 (GLOVE) ×2
GLOVE BIOGEL PI INDICATOR 7.5 (GLOVE) ×1
GOWN STRL REUS W/TWL LRG LVL3 (GOWN DISPOSABLE) ×6 IMPLANT
NS IRRIG 1000ML POUR BTL (IV SOLUTION) ×2 IMPLANT
PACK LAPAROSCOPY BASIN (CUSTOM PROCEDURE TRAY) ×2 IMPLANT
PACK TRENDGUARD 450 HYBRID PRO (MISCELLANEOUS) IMPLANT
PACK TRENDGUARD 600 HYBRD PROC (MISCELLANEOUS) IMPLANT
POUCH LAPAROSCOPIC INSTRUMENT (MISCELLANEOUS) ×2 IMPLANT
POUCH SPECIMEN RETRIEVAL 10MM (ENDOMECHANICALS) ×2 IMPLANT
PROTECTOR NERVE ULNAR (MISCELLANEOUS) ×4 IMPLANT
SET IRRIG TUBING LAPAROSCOPIC (IRRIGATION / IRRIGATOR) ×1 IMPLANT
SHEARS HARMONIC ACE PLUS 36CM (ENDOMECHANICALS) ×1 IMPLANT
SLEEVE XCEL OPT CAN 5 100 (ENDOMECHANICALS) ×3 IMPLANT
SUT PLAIN 4 0 FS 2 27 (SUTURE) ×2 IMPLANT
SUT VICRYL 0 UR6 27IN ABS (SUTURE) ×2 IMPLANT
TOWEL OR 17X24 6PK STRL BLUE (TOWEL DISPOSABLE) ×4 IMPLANT
TRENDGUARD 450 HYBRID PRO PACK (MISCELLANEOUS) ×2
TRENDGUARD 600 HYBRID PROC PK (MISCELLANEOUS)
TROCAR XCEL NON-BLD 11X100MML (ENDOMECHANICALS) ×2 IMPLANT
WARMER LAPAROSCOPE (MISCELLANEOUS) ×2 IMPLANT

## 2017-03-16 NOTE — Anesthesia Procedure Notes (Signed)
Procedure Name: Intubation Date/Time: 03/16/2017 7:39 AM Performed by: Montez Hageman Pre-anesthesia Checklist: Patient identified, Emergency Drugs available, Suction available and Patient being monitored Patient Re-evaluated:Patient Re-evaluated prior to inductionOxygen Delivery Method: Circle system utilized Preoxygenation: Pre-oxygenation with 100% oxygen Intubation Type: IV induction Ventilation: Mask ventilation without difficulty Laryngoscope Size: Mac and 3 Grade View: Grade I Tube type: Oral Tube size: 7.0 mm Number of attempts: 1 Airway Equipment and Method: Stylet and LTA kit utilized Placement Confirmation: ETT inserted through vocal cords under direct vision,  positive ETCO2 and breath sounds checked- equal and bilateral Secured at: 21 cm Tube secured with: Tape Dental Injury: Teeth and Oropharynx as per pre-operative assessment

## 2017-03-16 NOTE — H&P (Signed)
The patient was examined.  I reviewed the proposed surgery and consent form with the patient.  The dictated history and physical is current and accurate and all questions were answered. The patient is ready to proceed with surgery and has a realistic understanding and expectation for the outcome.   [name],[name] MD, 7:12 AM 03/16/2017

## 2017-03-16 NOTE — Op Note (Signed)
[name] 1966-09-23 [id number]   Post Operative Note   Date of surgery:  03/16/2017  Pre Op Dx:  Pelvic pain, bilateral hydrosalpinx  Post Op Dx:  Pelvic pain, bilateral hydrosalpinx, endometriosis  Procedure:  Laparoscopic bilateral salpingo-oophorectomies, fulguration endometriosis  Surgeon:  Dara Lords  Assistant:  Reynaldo Minium  Anesthesia:  General  EBL:  Minimal  Complications:  None  Specimen:  Right and left fallopian tubes with gross hydrosalpinx. Right and left ovaries postmenopausal in appearance. Opening cell washing to pathology  Findings: EUA:  External BUS vagina with atrophic changes. Cervix stenotic. Uterus grossly normal size midline mobile. Adnexa without gross masses   Operative:  Anterior cul-de-sac normal. Posterior cul-de-sac with small classic appearing powder burn lesion right uterosacral ligament, fulgurated. Right and left fallopian tubes with classic hydrosalpinx, left larger than right with dilated tubes. Right and left ovaries postmenopausal and appearance. Left pelvic sidewall with classic endometriotic implant, fulgurated. Uterus normal size shape and contour.  Upper abdominal exam shows appendix grossly normal free and mobile. Liver smooth without abnormalities or perihepatic adhesions. Gallbladder not visualized.  Procedure:  The patient was taken to the operating room, placed in the low dorsal lithotomy position and underwent general anesthesia without difficulty. The timeout was performed by the surgical team. The patient received a perineal/vaginal preparation with Betadine solution and an abdominal preparation with ChloraPrep per nursing personnel. The cervix was visualized, anterior lip grasped with a single-tooth tenaculum and noting the cervical stenosis, small wire dilators were sequentially passed to dilate the cervix and subsequently a larger dilator was used to adequately dilate the cervix to allow for the Hulka tenaculum  placement. The bladder was then emptied with an in and out Foley catheterization done in sterile technique. The patient was then draped in the usual fashion and due to her prior history of laparoscopies 2 a left upper quadrant entry was performed after the stomach was emptied with a oral gastric tube. A small skin incision was made 2-3 finger breaths below the mid left costal margin and using the direct entry 5 mm trocar the abdomen was directly entered without difficulty and subsequently insufflated. Examination of the abdomen showed no anterior abdominal wall adhesions and subsequently a vertical infraumbilical incision was made and the 10 mm direct entry trocar was placed under direct visualization without difficulty. The right 5 mm suprapubic port was then placed under direct visualization after transillumination of the vessels without difficulty. Examination of the pelvic organs and upper abdominal exam was performed with findings noted above. An opening cell washing was then obtained and sent to cytology. Both adnexa to include the fallopian tubes and ovaries were free and mobile with no sidewall adhesions. The left adnexa was elevated, the ureter identified away from the surgical site and using the harmonic scalpel the left infundibulopelvic ligament and vessels were transected without difficulty. The adnexa was freed through progressive transections of the broad ligament and ultimately the uterine ovarian pedicle to include the fallopian tube at the insertion with the uterus using the harmonic scalpel. The specimen was placed in the posterior cul-de-sac for future retrieval. A similar procedure was carried out on the other side. A 5 mm laparoscope was then placed through the right suprapubic port and an Endopouch was placed through the umbilical port and both specimens were retrieved through the infra umbilical port without difficulty.  The 10 mm laparoscope was then replaced infraumbilically, the abdomen  reinsufflated, the pelvis irrigated and adequate hemostasis was visualized at all surgical  sites. The left pelvic sidewall endometriotic implant was inspected and subsequently superficially bipolar cauterized noting it was away from the ureter and pelvic sidewall vessels. The right uterosacral ligament powder burn lesion was also bipolar cauterized without difficulty. The 5 mm trochars were then removed under direct visualization showing adequate hemostasis and the infraumbilical port was backed out showing adequate hemostasis and no evidence of hernia formation. All skin incisions were injected using 0.25% Marcaine, 10 cc total and the infraumbilical fascia was reapproximated using 0 Vicryl suture in a running stitch. The infraumbilical skin was reapproximated using 4-0 plain suture in a running subcuticular stitch and subsequently LiquiBand skin adhesive was applied as was it applied to the 5 mm ports to close the skin incisions. The patient received intraoperative Toradol. The sponge and needle count were verified correct, the specimens properly identified for pathology, the patient awakened without difficulty and taken to recovery room in good condition having tolerated procedure well.     [name],[name] MD, 9:00 AM 03/16/2017

## 2017-03-16 NOTE — Anesthesia Postprocedure Evaluation (Signed)
Anesthesia Post Note  Patient: [name]  Procedure(s) Performed: Procedure(s) (LRB): LAPAROSCOPIC SALPINGO OOPHORECTOMY (Bilateral)     Patient location during evaluation: PACU Anesthesia Type: General Level of consciousness: awake and alert Pain management: pain level controlled Vital Signs Assessment: post-procedure vital signs reviewed and stable Respiratory status: spontaneous breathing, nonlabored ventilation, respiratory function stable and patient connected to nasal cannula oxygen Cardiovascular status: blood pressure returned to baseline and stable Postop Assessment: no signs of nausea or vomiting Anesthetic complications: no    Last Vitals:  Vitals:   03/16/17 0930 03/16/17 0945  BP: (!) 108/48 115/60  Pulse: 80 75  Resp: 11 12  Temp:      Last Pain:  Vitals:   03/16/17 0640  TempSrc:   PainSc: 2                  Phillips Groutarignan, Lachelle Rissler

## 2017-03-16 NOTE — Anesthesia Preprocedure Evaluation (Signed)
Anesthesia Evaluation  Patient identified by MRN, date of birth, ID band Patient awake    Reviewed: Allergy & Precautions, NPO status , Patient's Chart, lab work & pertinent test results  Airway Mallampati: II  TM Distance: >3 FB Neck ROM: Full    Dental no notable dental hx.    Pulmonary neg pulmonary ROS,    Pulmonary exam normal breath sounds clear to auscultation       Cardiovascular negative cardio ROS Normal cardiovascular exam Rhythm:Regular Rate:Normal     Neuro/Psych negative neurological ROS  negative psych ROS   GI/Hepatic negative GI ROS, Neg liver ROS,   Endo/Other  negative endocrine ROS  Renal/GU negative Renal ROS  negative genitourinary   Musculoskeletal negative musculoskeletal ROS (+)   Abdominal   Peds negative pediatric ROS (+)  Hematology negative hematology ROS (+)   Anesthesia Other Findings   Reproductive/Obstetrics negative OB ROS                             Anesthesia Physical Anesthesia Plan  ASA: II  Anesthesia Plan: General   Post-op Pain Management:    Induction: Intravenous  PONV Risk Score and Plan: 3 and Ondansetron, Dexamethasone, Propofol and Midazolam  Airway Management Planned: Oral ETT  Additional Equipment:   Intra-op Plan:   Post-operative Plan: Extubation in OR  Informed Consent: I have reviewed the patients History and Physical, chart, labs and discussed the procedure including the risks, benefits and alternatives for the proposed anesthesia with the patient or authorized representative who has indicated his/her understanding and acceptance.   Dental advisory given  Plan Discussed with: CRNA  Anesthesia Plan Comments:         Anesthesia Quick Evaluation

## 2017-03-16 NOTE — Transfer of Care (Signed)
  Last Vitals:  Vitals:   03/16/17 0615  BP: 99/77  Pulse: 71  Resp: 16  Temp: 36.9 C    Last Pain:  Vitals:   03/16/17 0640  TempSrc:   PainSc: 2       Patients Stated Pain Goal: 6 (03/16/17 0640)  Immediate Anesthesia Transfer of Care Note  Patient: [name]  Procedure(s) Performed: Procedure(s) (LRB): LAPAROSCOPIC SALPINGO OOPHORECTOMY (Bilateral)  Patient Location: PACU  Anesthesia Type: General  Level of Consciousness: awake, alert  and oriented  Airway & Oxygen Therapy: Patient Spontanous Breathing and Patient connected to nasal cannula oxygen  Post-op Assessment: Report given to PACU RN and Post -op Vital signs reviewed and stable  Post vital signs: Reviewed and stable  Complications: No apparent anesthesia complications

## 2017-03-16 NOTE — Discharge Instructions (Signed)
Postoperative Instructions Laparoscopy ° °Dr. Fontaine and the nursing staff have discussed postoperative instructions with you.  If you have any questions please ask them before you leave the hospital, or call Dr Fontaine’s office at [phone].   ° °We would like to emphasize the following instructions: ° ° °? Call the office to make your follow-up appointment as recommended by Dr Fontaine (usually 1-2 weeks). ° °? You were given a prescription, or one was ordered for you at the pharmacy you designated.  Get that prescription filled and take the medication according to instructions. ° °? You may eat a regular diet, but slowly until you start having bowel movements. ° °? Drink plenty of water daily. ° °? Nothing in the vagina (intercourse, douching, objects of any kind) for 2 weeks.  When reinitiating intercourse, if it is uncomfortable, stop and make an appointment with Dr Fontaine to be evaluated. ° °? No driving for several days until the anesthesia has worn off and you are not having significant pain.  Car rides (short) are ok, as long as you are not having significant pain, but no traveling out of town until your postoperative appointment. ° °? You may shower, but no baths for two weeks.  Walking up and down stairs is ok.  No heavy lifting, prolonged standing, repeated bending or any “working out” until your first  postoperative appointment. ° °? Rest frequently, listen to your body and do not push yourself and overdo it. ° °? Call if: ° °o Your pain medication does not seem strong enough. °o Worsening pain or abdominal bloating °o Persistent nausea or vomiting °o Difficulty with urination or bowel movements. °o Temperature of 101 degrees or higher. °o Heavy vaginal bleeding.  If your period is due, you may use tampons.   °o Incisions become red, tender or begin to drain. °o You have any questions or concerns ° ° °Post Anesthesia Home Care Instructions ° °Activity: °Get plenty of rest for the remainder of  the day. A responsible individual must stay with you for 24 hours following the procedure.  °For the next 24 hours, DO NOT: °-Drive a car °-Operate machinery °-Drink alcoholic beverages °-Take any medication unless instructed by your physician °-Make any legal decisions or sign important papers. ° °Meals: °Start with liquid foods such as gelatin or soup. Progress to regular foods as tolerated. Avoid greasy, spicy, heavy foods. If nausea and/or vomiting occur, drink only clear liquids until the nausea and/or vomiting subsides. Call your physician if vomiting continues. ° °Special Instructions/Symptoms: °Your throat may feel dry or sore from the anesthesia or the breathing tube placed in your throat during surgery. If this causes discomfort, gargle with warm salt water. The discomfort should disappear within 24 hours. ° °If you had a scopolamine patch placed behind your ear for the management of post- operative nausea and/or vomiting: ° °1. The medication in the patch is effective for 72 hours, after which it should be removed.  Wrap patch in a tissue and discard in the trash. Wash hands thoroughly with soap and water. °2. You may remove the patch earlier than 72 hours if you experience unpleasant side effects which may include dry mouth, dizziness or visual disturbances. °3. Avoid touching the patch. Wash your hands with soap and water after contact with the patch. °   ° ° ° °

## 2017-03-17 ENCOUNTER — Encounter (HOSPITAL_BASED_OUTPATIENT_CLINIC_OR_DEPARTMENT_OTHER): Payer: Self-pay | Admitting: Gynecology

## 2017-03-20 ENCOUNTER — Telehealth: Payer: Self-pay

## 2017-03-20 MED ORDER — ONDANSETRON HCL 8 MG PO TABS: 8.0000 mg | ORAL_TABLET | Freq: Three times a day (TID) | ORAL | 0 refills | Status: DC | PRN

## 2017-03-20 NOTE — Telephone Encounter (Signed)
Surgery on 03/16/17.   #1   C/O "really nauseated" for the last few days. Just grazing not eating meals.  #2 100.3 fever yesterday. Took Tylenol and it resolved and she does not think she has had fever since.  #3 UTI sx.  After emptying bladder with sensative. Pressure, all the time feels like needs to urinate. Some burning.  She did say she has had several other laps and recovery from this one so far best. She only took pain medication the first day and after that just an Advil every now and then.  What to rec?

## 2017-03-20 NOTE — Telephone Encounter (Signed)
Patient informed. Rx sent 

## 2017-03-20 NOTE — Telephone Encounter (Signed)
Push fluids so that she is urinating on a regular basis.  Zofran 8 mg ODT #10 one by mouth every 8 hours when necessary nausea. Call if temperature 101 or higher.  Office visit if continues not to feel well over the next 24-48 hours. Do not expect her to feel 100% but she should start to feel better.

## 2017-04-03 ENCOUNTER — Ambulatory Visit (INDEPENDENT_AMBULATORY_CARE_PROVIDER_SITE_OTHER): Payer: 59 | Admitting: Gynecology

## 2017-04-03 ENCOUNTER — Encounter: Payer: Self-pay | Admitting: Gynecology

## 2017-04-03 VITALS — BP 112/76

## 2017-04-03 DIAGNOSIS — Z4889 Encounter for other specified surgical aftercare: Secondary | ICD-10-CM

## 2017-04-03 NOTE — Progress Notes (Signed)
    Tracy DyChristina K Love 07/11/1966 [id number]        51 y.o.  G2P0020 presents for postoperative visit status post laparoscopic bilateral salpingo-oophorectomies. Doing well without complaints. No significant menopausal symptoms.  Past medical history,surgical history, problem list, medications, allergies, family history and social history were all reviewed and documented in the EPIC chart.  Directed ROS with pertinent positives and negatives documented in the history of present illness/assessment and plan.  Exam: Kennon PortelaKim Gardner assistant Vitals:   04/03/17 0813  BP: 112/76   General appearance:  Normal Abdomen soft nontender without masses guarding rebound. Incisions healed nicely. Pelvic with bimanual showing uterus normal size midline mobile nontender. Adnexa without masses or tenderness  Assessment/Plan:  51 y.o. G2P0020 with normal postoperative visit status post laparoscopic bilateral salpingo-oophorectomies. I reviewed pictures of the surgery as well as pathology showing hydrosalpinx. I also discussed the small focus of endometriosis that I fulgurated. Patient will slowly resume normal activities. She's due for annual exam now will schedule this over the next several months. 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 Of recommended that she stay out of work until end of this month to allow for complete healing and return to full duties.    [name],Johnny Gorter P MD, 8:31 AM 04/03/2017

## 2017-04-03 NOTE — Patient Instructions (Signed)
Follow up for your annual exam over the next several months

## 2017-04-05 ENCOUNTER — Ambulatory Visit: Payer: 59 | Admitting: Gynecology

## 2017-04-11 ENCOUNTER — Ambulatory Visit (INDEPENDENT_AMBULATORY_CARE_PROVIDER_SITE_OTHER): Payer: 59 | Admitting: Women's Health

## 2017-04-11 ENCOUNTER — Encounter: Payer: Self-pay | Admitting: Women's Health

## 2017-04-11 VITALS — BP 120/70

## 2017-04-11 DIAGNOSIS — R350 Frequency of micturition: Secondary | ICD-10-CM | POA: Diagnosis not present

## 2017-04-11 LAB — URINALYSIS W MICROSCOPIC + REFLEX CULTURE
BILIRUBIN URINE: NEGATIVE
Casts: NONE SEEN [LPF]
Crystals: NONE SEEN [HPF]
GLUCOSE, UA: NEGATIVE
Hgb urine dipstick: NEGATIVE
Ketones, ur: NEGATIVE
Leukocytes, UA: NEGATIVE
NITRITE: NEGATIVE
Protein, ur: NEGATIVE
RBC / HPF: NONE SEEN RBC/HPF (ref <=2)
SPECIFIC GRAVITY, URINE: 1.01 (ref 1.001–1.035)
Yeast: NONE SEEN [HPF]
pH: 7 (ref 5.0–8.0)

## 2017-04-11 MED ORDER — PHENAZOPYRIDINE HCL 200 MG PO TABS: 200.0000 mg | ORAL_TABLET | Freq: Three times a day (TID) | ORAL | 0 refills | Status: DC | PRN

## 2017-04-11 NOTE — Progress Notes (Signed)
Presents with complaint of increased urinary frequency, urgency, mild occasional burning  with urination for the past 2 days which has improved today after over-the-counter Azo and increasing fluids. 02/2017 laparoscopic BSO for recurrent ovarian cysts. Has done well since surgery. Having regular bowel movements. Denies vaginal discharge, back pain or fever, not sexually active. Has been under increased stress, has stayed with her grandmother who was in hospice care who recently died.  Exam: Appears well. No CVAT. Declines need for wet prep. Laparoscopic incisional sites well-healed. UA negative, negative leukocytes, 0-5 WBCs, 0-5 squamous epithelials, few bacteria.  Urinary frequency  Plan: Urine culture pending. Continue increased fluids, prescription for Pyridium 200 mg 3 times daily if needed. UTI prevention discussed. Condolences given on grandmother's death.

## 2017-04-12 LAB — URINE CULTURE: Organism ID, Bacteria: NO GROWTH

## 2017-04-24 ENCOUNTER — Encounter: Payer: Self-pay | Admitting: Gynecology

## 2017-04-24 ENCOUNTER — Ambulatory Visit (INDEPENDENT_AMBULATORY_CARE_PROVIDER_SITE_OTHER): Payer: 59 | Admitting: Gynecology

## 2017-04-24 VITALS — BP 118/78 | Ht 66.0 in | Wt 127.0 lb

## 2017-04-24 DIAGNOSIS — Z9889 Other specified postprocedural states: Secondary | ICD-10-CM

## 2017-04-24 NOTE — Patient Instructions (Signed)
Follow up when you're due for your next annual exam. 

## 2017-04-24 NOTE — Progress Notes (Signed)
    [name] 02/11/1966 [id number]        51 y.o.  G2P0020 presents for postoperative visit and letter to return to work status post laparoscopic bilateral salpingo-oophorectomies. Doing well without complaints  Past medical history,surgical history, problem list, medications, allergies, family history and social history were all reviewed and documented in the EPIC chart.  Directed ROS with pertinent positives and negatives documented in the history of present illness/assessment and plan.  Exam: Vitals:   04/24/17 0807  BP: 118/78  Weight: 127 lb (57.6 kg)  Height: 5\' 6"  (1.676 m)   General appearance:  Normal Abdomen soft nontender without masses guarding rebound. Incisions all healed nicely.  Assessment/Plan:  51 y.o. G2P0020 with normal postoperative visit. Ready to resume all normal activities. Letter written and provided to the patient. Patient will follow up when due for annual exam, sooner as needed.    [name],Caelan Atchley P MD, 8:26 AM 04/24/2017

## 2017-06-13 ENCOUNTER — Encounter: Payer: Self-pay | Admitting: Family Medicine

## 2017-06-13 ENCOUNTER — Ambulatory Visit (INDEPENDENT_AMBULATORY_CARE_PROVIDER_SITE_OTHER): Payer: 59 | Admitting: Family Medicine

## 2017-06-13 VITALS — BP 98/70 | HR 86 | Temp 98.2°F | Ht 67.5 in | Wt 126.0 lb

## 2017-06-13 DIAGNOSIS — Z7689 Persons encountering health services in other specified circumstances: Secondary | ICD-10-CM

## 2017-06-13 DIAGNOSIS — J069 Acute upper respiratory infection, unspecified: Secondary | ICD-10-CM | POA: Diagnosis not present

## 2017-06-13 DIAGNOSIS — J3089 Other allergic rhinitis: Secondary | ICD-10-CM

## 2017-06-13 DIAGNOSIS — F419 Anxiety disorder, unspecified: Secondary | ICD-10-CM

## 2017-06-13 DIAGNOSIS — D270 Benign neoplasm of right ovary: Secondary | ICD-10-CM | POA: Insufficient documentation

## 2017-06-13 DIAGNOSIS — N83201 Unspecified ovarian cyst, right side: Secondary | ICD-10-CM | POA: Insufficient documentation

## 2017-06-13 DIAGNOSIS — J4520 Mild intermittent asthma, uncomplicated: Secondary | ICD-10-CM

## 2017-06-13 DIAGNOSIS — H6983 Other specified disorders of Eustachian tube, bilateral: Secondary | ICD-10-CM | POA: Diagnosis not present

## 2017-06-13 DIAGNOSIS — N83202 Unspecified ovarian cyst, left side: Secondary | ICD-10-CM

## 2017-06-13 DIAGNOSIS — B009 Herpesviral infection, unspecified: Secondary | ICD-10-CM

## 2017-06-13 MED ORDER — VALACYCLOVIR HCL 500 MG PO TABS: 500.0000 mg | ORAL_TABLET | Freq: Two times a day (BID) | ORAL | 3 refills | Status: DC

## 2017-06-13 MED ORDER — FLUTICASONE PROPIONATE (INHAL) 100 MCG/BLIST IN AEPB: 100.0000 ug | INHALATION_SPRAY | Freq: Every day | RESPIRATORY_TRACT | 3 refills | Status: DC

## 2017-06-13 MED ORDER — BUSPIRONE HCL 5 MG PO TABS: 5.0000 mg | ORAL_TABLET | Freq: Two times a day (BID) | ORAL | 3 refills | Status: DC

## 2017-06-13 MED ORDER — MONTELUKAST SODIUM 10 MG PO TABS: 10.0000 mg | ORAL_TABLET | Freq: Every day | ORAL | 5 refills | Status: DC

## 2017-06-13 MED ORDER — BUSPIRONE HCL 10 MG PO TABS: 5.0000 mg | ORAL_TABLET | Freq: Two times a day (BID) | ORAL | 3 refills | Status: DC
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
## 2017-06-13 NOTE — Patient Instructions (Addendum)
Generalized Anxiety Disorder, Adult Generalized anxiety disorder (GAD) is a mental health disorder. People with this condition constantly worry about everyday events. Unlike normal anxiety, worry related to GAD is not triggered by a specific event. These worries also do not fade or get better with time. GAD interferes with life functions, including relationships, work, and school. GAD can vary from mild to severe. People with severe GAD can have intense waves of anxiety with physical symptoms (panic attacks). What are the causes? The exact cause of GAD is not known. What increases the risk? This condition is more likely to develop in:  Women.  People who have a family history of anxiety disorders.  People who are very shy.  People who experience very stressful life events, such as the death of a loved one.  People who have a very stressful family environment.  What are the signs or symptoms? People with GAD often worry excessively about many things in their lives, such as their health and family. They may also be overly concerned about:  Doing well at work.  Being on time.  Natural disasters.  Friendships.  Physical symptoms of GAD include:  Fatigue.  Muscle tension or having muscle twitches.  Trembling or feeling shaky.  Being easily startled.  Feeling like your heart is pounding or racing.  Feeling out of breath or like you cannot take a deep breath.  Having trouble falling asleep or staying asleep.  Sweating.  Nausea, diarrhea, or irritable bowel syndrome (IBS).  Headaches.  Trouble concentrating or remembering facts.  Restlessness.  Irritability.  How is this diagnosed? Your health care provider can diagnose GAD based on your symptoms and medical history. You will also have a physical exam. The health care provider will ask specific questions about your symptoms, including how severe they are, when they started, and if they come and go. Your health care  provider may ask you about your use of alcohol or drugs, including prescription medicines. Your health care provider may refer you to a mental health specialist for further evaluation. Your health care provider will do a thorough examination and may perform additional tests to rule out other possible causes of your symptoms. To be diagnosed with GAD, a person must have anxiety that:  Is out of his or her control.  Affects several different aspects of his or her life, such as work and relationships.  Causes distress that makes him or her unable to take part in normal activities.  Includes at least three physical symptoms of GAD, such as restlessness, fatigue, trouble concentrating, irritability, muscle tension, or sleep problems.  Before your health care provider can confirm a diagnosis of GAD, these symptoms must be present more days than they are not, and they must last for six months or longer. How is this treated? The following therapies are usually used to treat GAD:  Medicine. Antidepressant medicine is usually prescribed for long-term daily control. Antianxiety medicines may be added in severe cases, especially when panic attacks occur.  Talk therapy (psychotherapy). Certain types of talk therapy can be helpful in treating GAD by providing support, education, and guidance. Options include: ? Cognitive behavioral therapy (CBT). People learn coping skills and techniques to ease their anxiety. They learn to identify unrealistic or negative thoughts and behaviors and to replace them with positive ones. ? Acceptance and commitment therapy (ACT). This treatment teaches people how to be mindful as a way to cope with unwanted thoughts and feelings. ? Biofeedback. This process trains you to   manage your body's response (physiological response) through breathing techniques and relaxation methods. You will work with a therapist while machines are used to monitor your physical symptoms.  Stress  management techniques. These include yoga, meditation, and exercise.  A mental health specialist can help determine which treatment is best for you. Some people see improvement with one type of therapy. However, other people require a combination of therapies. Follow these instructions at home:  Take over-the-counter and prescription medicines only as told by your health care provider.  Try to maintain a normal routine.  Try to anticipate stressful situations and allow extra time to manage them.  Practice any stress management or self-calming techniques as taught by your health care provider.  Do not punish yourself for setbacks or for not making progress.  Try to recognize your accomplishments, even if they are small.  Keep all follow-up visits as told by your health care provider. This is important. Contact a health care provider if:  Your symptoms do not get better.  Your symptoms get worse.  You have signs of depression, such as: ? A persistently sad, cranky, or irritable mood. ? Loss of enjoyment in activities that used to bring you joy. ? Change in weight or eating. ? Changes in sleeping habits. ? Avoiding friends or family members. ? Loss of energy for normal tasks. ? Feelings of guilt or worthlessness. Get help right away if:  You have serious thoughts about hurting yourself or others. If you ever feel like you may hurt yourself or others, or have thoughts about taking your own life, get help right away. You can go to your nearest emergency department or call:  Your local emergency services (911 in the [place]  A suicide crisis helpline, such as the National Suicide Prevention Lifeline at 314-249-7346. This is open 24 hours a day.  Summary  Generalized anxiety disorder (GAD) is a mental health disorder that involves worry that is not triggered by a specific event.  People with GAD often worry excessively about many things in their lives, such as their health and  family.  GAD may cause physical symptoms such as restlessness, trouble concentrating, sleep problems, frequent sweating, nausea, diarrhea, headaches, and trembling or muscle twitching.  A mental health specialist can help determine which treatment is best for you. Some people see improvement with one type of therapy. However, other people require a combination of therapies. This information is not intended to replace advice given to you by your health care provider. Make sure you discuss any questions you have with your health care provider. Document Released: 01/07/2013 Document Revised: 08/02/2016 Document Reviewed: 08/02/2016 Elsevier Interactive Patient Education  2018 Else[employer] Inc.  Eustachian Tube Dysfunction The eustachian tube connects the middle ear to the back of the nose. It regulates air pressure in the middle ear by allowing air to move between the ear and nose. It also helps to drain fluid from the middle ear space. When the eustachian tube does not function properly, air pressure, fluid, or both can build up in the middle ear. Eustachian tube dysfunction can affect one or both ears. What are the causes? This condition happens when the eustachian tube becomes blocked or cannot open normally. This may result from:  Ear infections.  Colds and other upper respiratory infections.  Allergies.  Irritation, such as from cigarette smoke or acid from the stomach coming up into the esophagus (gastroesophageal reflux).  Sudden changes in air pressure, such as from descending in an airplane.  Abnormal  growths in the nose or throat, such as nasal polyps, tumors, or enlarged tissue at the back of the throat (adenoids).  What increases the risk? This condition may be more likely to develop in people who smoke and people who are overweight. Eustachian tube dysfunction may also be more likely to develop in children, especially children who have:  Certain birth defects of the mouth, such  as cleft palate.  Large tonsils and adenoids.  What are the signs or symptoms? Symptoms of this condition may include:  A feeling of fullness in the ear.  Ear pain.  Clicking or popping noises in the ear.  Ringing in the ear.  Hearing loss.  Loss of balance.  Symptoms may get worse when the air pressure around you changes, such as when you travel to an area of high elevation or fly on an airplane. How is this diagnosed? This condition may be diagnosed based on:  Your symptoms.  A physical exam of your ear, nose, and throat.  Tests, such as those that measure: ? The movement of your eardrum (tympanogram). ? Your hearing (audiometry).  How is this treated? Treatment depends on the cause and severity of your condition. If your symptoms are mild, you may be able to relieve your symptoms by moving air into ("popping") your ears. If you have symptoms of fluid in your ears, treatment may include:  Decongestants.  Antihistamines.  Nasal sprays or ear drops that contain medicines that reduce swelling (steroids).  In some cases, you may need to have a procedure to drain the fluid in your eardrum (myringotomy). In this procedure, a small tube is placed in the eardrum to:  Drain the fluid.  Restore the air in the middle ear space.  Follow these instructions at home:  Take over-the-counter and prescription medicines only as told by your health care provider.  Use techniques to help pop your ears as recommended by your health care provider. These may include: ? Chewing gum. ? Yawning. ? Frequent, forceful swallowing. ? Closing your mouth, holding your nose closed, and gently blowing as if you are trying to blow air out of your nose.  Do not do any of the following until your health care provider approves: ? Travel to high altitudes. ? Fly in airplanes. ? Work in a Estate agent or room. ? Scuba dive.  Keep your ears dry. Dry your ears completely after showering or  bathing.  Do not smoke.  Keep all follow-up visits as told by your health care provider. This is important. Contact a health care provider if:  Your symptoms do not go away after treatment.  Your symptoms come back after treatment.  You are unable to pop your ears.  You have: ? A fever. ? Pain in your ear. ? Pain in your head or neck. ? Fluid draining from your ear.  Your hearing suddenly changes.  You become very dizzy.  You lose your balance. This information is not intended to replace advice given to you by your health care provider. Make sure you discuss any questions you have with your health care provider. Document Released: 10/09/2015 Document Revised: 02/18/2016 Document Reviewed: 10/01/2014 Elsevier Interactive Patient Education  [employer].

## 2017-06-15 ENCOUNTER — Encounter: Payer: Self-pay | Admitting: Family Medicine

## 2017-06-19 DIAGNOSIS — J018 Other acute sinusitis: Secondary | ICD-10-CM | POA: Diagnosis not present

## 2017-07-10 ENCOUNTER — Ambulatory Visit (INDEPENDENT_AMBULATORY_CARE_PROVIDER_SITE_OTHER): Payer: 59 | Admitting: Family Medicine

## 2017-07-10 ENCOUNTER — Telehealth: Payer: Self-pay | Admitting: Family Medicine

## 2017-07-10 ENCOUNTER — Encounter: Payer: Self-pay | Admitting: Family Medicine

## 2017-07-10 VITALS — BP 100/70 | HR 61 | Temp 97.7°F | Wt 125.9 lb

## 2017-07-10 DIAGNOSIS — F329 Major depressive disorder, single episode, unspecified: Secondary | ICD-10-CM | POA: Insufficient documentation

## 2017-07-10 DIAGNOSIS — F32A Depression, unspecified: Secondary | ICD-10-CM | POA: Insufficient documentation

## 2017-07-10 DIAGNOSIS — F419 Anxiety disorder, unspecified: Secondary | ICD-10-CM | POA: Diagnosis not present

## 2017-07-10 DIAGNOSIS — F339 Major depressive disorder, recurrent, unspecified: Secondary | ICD-10-CM | POA: Diagnosis not present

## 2017-07-10 MED ORDER — ALPRAZOLAM 0.5 MG PO TABS: 0.5000 mg | ORAL_TABLET | Freq: Every evening | ORAL | 0 refills | Status: DC | PRN

## 2017-07-10 NOTE — Telephone Encounter (Signed)
ERROR

## 2017-07-10 NOTE — Patient Instructions (Addendum)
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
## 2017-07-18 ENCOUNTER — Other Ambulatory Visit: Payer: Self-pay | Admitting: Emergency Medicine

## 2017-07-18 DIAGNOSIS — F419 Anxiety disorder, unspecified: Secondary | ICD-10-CM

## 2017-07-24 ENCOUNTER — Ambulatory Visit (INDEPENDENT_AMBULATORY_CARE_PROVIDER_SITE_OTHER): Payer: 59 | Admitting: Family Medicine

## 2017-07-24 ENCOUNTER — Encounter: Payer: Self-pay | Admitting: Family Medicine

## 2017-07-24 VITALS — BP 104/70 | HR 66

## 2017-07-24 DIAGNOSIS — F419 Anxiety disorder, unspecified: Secondary | ICD-10-CM

## 2017-07-24 DIAGNOSIS — G43809 Other migraine, not intractable, without status migrainosus: Secondary | ICD-10-CM

## 2017-07-24 DIAGNOSIS — F32A Depression, unspecified: Secondary | ICD-10-CM

## 2017-07-24 DIAGNOSIS — F329 Major depressive disorder, single episode, unspecified: Secondary | ICD-10-CM | POA: Diagnosis not present

## 2017-07-24 MED ORDER — ONDANSETRON HCL 4 MG PO TABS: 4.0000 mg | ORAL_TABLET | Freq: Three times a day (TID) | ORAL | 3 refills | Status: DC | PRN

## 2017-07-24 MED ORDER — RIZATRIPTAN BENZOATE 10 MG PO TBDP: 10.0000 mg | ORAL_TABLET | ORAL | 3 refills | Status: DC | PRN

## 2017-07-24 NOTE — Progress Notes (Signed)
Subjective:    Patient ID: [name], female    DOB: 02/06/1966, 51 y.o.   MRN: [id number]  Chief Complaint  Patient presents with  . Migraine    HPI Patient was seen today for f/u.  Migrianes: -had in past, but occurring more frequenlty this wk -Headache starts in the back of left neck and goes behind left eye -Endorses nausea, blurred vision, aura, sensitivity to light and sound -Pt endorses 3 migraines and last 6 weeks -took Maxalt in the past with good result -Pt took Phenergan from her friend. Is requesting this medicine because it helped her sleep. -In the past headaches were triggered by hormones, red wine, chocolate.  Depression/anxiety: -Pt has an appointment with her therapist tomorrow.  Pt's therapist was previously out on bed rest 2/2 pregnancy. -Pt states she is doing better -Pt continues to endorse stress at work -Pt did not get the job she was applying for in [place] -She is applying for other positions and is hopeful. -Pt has not been taking BuSpar as she states it makes her "edgy, cranky, bitchy" -Still taking Wellbutrin XL 150 mg    Past Medical History:  Diagnosis Date  . Medical history non-contributory   . Pelvic pain     No Known Allergies  ROS General: Denies fever, chills, night sweats, changes in weight, changes in appetite HEENT: Denies ear pain, changes in vision, rhinorrhea, sore throat   +migraines CV: Denies CP, palpitations, SOB, orthopnea Pulm: Denies SOB, cough, wheezing GI: Denies abdominal pain, nausea, vomiting, diarrhea, constipation GU: Denies dysuria, hematuria, frequency, vaginal discharge Msk: Denies muscle cramps, joint pains Neuro: Denies weakness, numbness, tingling Skin: Denies rashes, bruising Psych: Denies depression, hallucinations   +anxiety     Objective:    Blood pressure 104/70, pulse 66, last menstrual period 02/19/2009.   Gen. Pleasant, well-nourished, in no distress, normal affect  HEENT: Malcolm/AT,  face symmetric, no scleral icterus, PERRLA, nares patent without drainage, pharynx without erythema or exudate. Lungs: no accessory muscle use, CTAB, no wheezes or rales Cardiovascular: RRR, no m/r/g, no peripheral edema Neuro:  A&Ox3, CN II-XII intact, normal gait Skin:  Warm, no lesions/ rash   Wt Readings from Last 3 Encounters:  07/10/17 125 lb 14.4 oz (57.1 kg)  06/13/17 126 lb (57.2 kg)  04/24/17 127 lb (57.6 kg)    Lab Results  Component Value Date   WBC 4.7 03/13/2017   HGB 12.3 03/13/2017   HCT 38.3 03/13/2017   PLT 295 03/13/2017   GLUCOSE 96 03/13/2017   NA 142 03/13/2017   K 4.0 03/13/2017   CL 107 03/13/2017   CREATININE 0.83 03/13/2017   BUN 9 03/13/2017   CO2 27 03/13/2017   TSH 1.00 04/21/2016    Assessment/Plan:  Other migraine without status migrainosus, not intractable  -Discussed avoiding triggers -Discussed staying well hydrated, getting plenty of rest, getting exercise -Given handout - Plan: rizatriptan (MAXALT-MLT) 10 MG disintegrating tablet, ondansetron (ZOFRAN) 4 MG tablet  Anxiety and depression -Continue Wellbutrin XL 150 mg -Encouraged to continue follow-up with counselor. Has appointment tomorrow -Encouraged to discuss with counselor about seeing an area psychiatrist. -Upcoming plan to wean off Xanax.  F/u in next 2 months.

## 2017-07-24 NOTE — Patient Instructions (Addendum)

## 2017-08-07 DIAGNOSIS — J3489 Other specified disorders of nose and nasal sinuses: Secondary | ICD-10-CM | POA: Diagnosis not present

## 2017-08-14 ENCOUNTER — Ambulatory Visit: Payer: 59 | Admitting: Family Medicine

## 2017-08-15 ENCOUNTER — Encounter: Payer: Self-pay | Admitting: Family Medicine

## 2017-08-15 ENCOUNTER — Ambulatory Visit (INDEPENDENT_AMBULATORY_CARE_PROVIDER_SITE_OTHER): Payer: 59 | Admitting: Family Medicine

## 2017-08-15 VITALS — BP 100/60 | HR 76 | Wt 128.2 lb

## 2017-08-15 DIAGNOSIS — F329 Major depressive disorder, single episode, unspecified: Secondary | ICD-10-CM

## 2017-08-15 DIAGNOSIS — F419 Anxiety disorder, unspecified: Secondary | ICD-10-CM

## 2017-08-15 DIAGNOSIS — Z8669 Personal history of other diseases of the nervous system and sense organs: Secondary | ICD-10-CM

## 2017-08-15 DIAGNOSIS — F32A Depression, unspecified: Secondary | ICD-10-CM

## 2017-08-15 NOTE — Progress Notes (Signed)
Subjective:    Patient ID: [name], female    DOB: 01/19/1966, 51 y.o.   MRN: [id number]  No chief complaint on file.   HPI Patient was seen today for f/u on anxiety/depression.  Patient has been self weaning from Wellbutrin XL 150 mg.  Patient states she has been breaking tabs in half and is no longer taking.  Pt did note nearly daily HAs last wk while doing this.  She has not had a HA since Saturday.  Pt has also weaned herself off Xanax.  She has not taken any in 1 wk.  Pt states her sleep is fair.  Endorses mind racing while laying in bed.  Taking a tea with melatonin in it.  Will also take Benadryl at night, but not sure it is helping.  Pt tried to see a Psychiatrist, but every office wants a $200 dollar deposit.  Past Medical History:  Diagnosis Date  . Medical history non-contributory   . Pelvic pain     No Known Allergies  ROS General: Denies fever, chills, night sweats, changes in weight, changes in appetite HEENT: Denies headaches, ear pain, changes in vision, rhinorrhea, sore throat CV: Denies CP, palpitations, SOB, orthopnea Pulm: Denies SOB, cough, wheezing GI: Denies abdominal pain, nausea, vomiting, diarrhea, constipation GU: Denies dysuria, hematuria, frequency, vaginal discharge Msk: Denies muscle cramps, joint pains Neuro: Denies weakness, numbness, tingling Skin: Denies rashes, bruising Psych: Denies depression, anxiety, hallucinations  + anxiety and depression.     Objective:    Blood pressure 100/60, pulse 76, weight 128 lb 3.2 oz (58.2 kg), last menstrual period 02/19/2009.   Gen. Pleasant, well-nourished, in no distress, normal affect  HEENT: /AT, face symmetric, no scleral icterus, PERRLA, EOMI, nares patent without drainage Lungs: no accessory muscle use, CTAB, no wheezes or rales Cardiovascular: RRR, no m/r/g, no peripheral edema Neuro: Mood appropriate, normal affect, slight anxiety, A&O x3.   Wt Readings from Last 3 Encounters:   08/15/17 128 lb 3.2 oz (58.2 kg)  07/10/17 125 lb 14.4 oz (57.1 kg)  06/13/17 126 lb (57.2 kg)    Lab Results  Component Value Date   WBC 4.7 03/13/2017   HGB 12.3 03/13/2017   HCT 38.3 03/13/2017   PLT 295 03/13/2017   GLUCOSE 96 03/13/2017   NA 142 03/13/2017   K 4.0 03/13/2017   CL 107 03/13/2017   CREATININE 0.83 03/13/2017   BUN 9 03/13/2017   CO2 27 03/13/2017   TSH 1.00 04/21/2016    Assessment/Plan:  Anxiety and depression -Patient encouraged to continue taking BuSpar 10 mg daily -Patient encouraged to find a psychiatric provider ASAP. -Patient given handout with various psychiatric providers. -Reviewed deep breathing exercises to help when patient has panic attack -Follow-up in 1 month, sooner if needed  Hx of migraines -Improving. -Continue use of Maxalt if needed -Patient has Zofran 4 mg for nausea during headaches.  If -If continues will refer to neurology. -Patient encouraged to stay hydrated

## 2017-08-21 ENCOUNTER — Ambulatory Visit (INDEPENDENT_AMBULATORY_CARE_PROVIDER_SITE_OTHER): Payer: 59 | Admitting: Family Medicine

## 2017-08-21 ENCOUNTER — Ambulatory Visit: Payer: 59 | Admitting: Family Medicine

## 2017-08-21 ENCOUNTER — Encounter: Payer: Self-pay | Admitting: Family Medicine

## 2017-08-21 VITALS — BP 98/60 | HR 89 | Temp 98.4°F | Wt 126.6 lb

## 2017-08-21 DIAGNOSIS — J209 Acute bronchitis, unspecified: Secondary | ICD-10-CM

## 2017-08-21 DIAGNOSIS — F5101 Primary insomnia: Secondary | ICD-10-CM | POA: Diagnosis not present

## 2017-08-21 MED ORDER — HYDROCODONE-HOMATROPINE 5-1.5 MG/5ML PO SYRP: 5.0000 mL | ORAL_SOLUTION | ORAL | 0 refills | Status: DC | PRN

## 2017-08-21 MED ORDER — AMOXICILLIN-POT CLAVULANATE 875-125 MG PO TABS: 1.0000 | ORAL_TABLET | Freq: Two times a day (BID) | ORAL | 0 refills | Status: DC

## 2017-08-21 MED ORDER — ALPRAZOLAM 1 MG PO TABS: 1.0000 mg | ORAL_TABLET | Freq: Every day | ORAL | 2 refills | Status: DC

## 2017-08-21 NOTE — Progress Notes (Signed)
   Subjective:    Patient ID: [name], female    DOB: 05/29/1966, 51 y.o.   MRN: [id number]  HPI Here for 5 days of stuffy head, PND, chest congestion and coughing up green sputum. No fever. Also she asks about refilling Xanax for sleep. She has been taking 0.5 mg qhs for sleep but often ends up taking 2 for it to be effective.    Review of Systems  Constitutional: Negative.   HENT: Positive for congestion and postnasal drip. Negative for sinus pressure, sinus pain and sore throat.   Eyes: Negative.   Respiratory: Positive for cough and chest tightness. Negative for shortness of breath and wheezing.   Psychiatric/Behavioral: Positive for sleep disturbance. Negative for dysphoric mood. The patient is nervous/anxious.        Objective:   Physical Exam  Constitutional: She is oriented to person, place, and time. She appears well-developed and well-nourished. No distress.  HENT:  Right Ear: External ear normal.  Left Ear: External ear normal.  Nose: Nose normal.  Mouth/Throat: Oropharynx is clear and moist.  Eyes: Conjunctivae are normal.  Neck: Neck supple. No thyromegaly present.  Pulmonary/Chest: Effort normal. No respiratory distress. She has no wheezes. She has no rales.  Scattered rhonchi   Lymphadenopathy:    She has no cervical adenopathy.  Neurological: She is alert and oriented to person, place, and time.  Psychiatric: She has a normal mood and affect. Her behavior is normal. Thought content normal.          Assessment & Plan:  For the bronchitis, she will take Augmentin and add Mucinex prn. Use Hydromet for the cough. increasethe Xanax to 1 mg qhs for sleep.  Gershon CraneStephen Mckay Tegtmeyer, MD

## 2017-08-29 ENCOUNTER — Telehealth: Payer: Self-pay | Admitting: Emergency Medicine

## 2017-08-29 DIAGNOSIS — J3089 Other allergic rhinitis: Secondary | ICD-10-CM

## 2017-08-29 MED ORDER — FLUTICASONE PROPIONATE (INHAL) 100 MCG/BLIST IN AEPB: 100.0000 ug | INHALATION_SPRAY | Freq: Every day | RESPIRATORY_TRACT | 3 refills | Status: DC

## 2017-08-29 NOTE — Telephone Encounter (Signed)
Rx was sent  

## 2017-08-29 NOTE — Addendum Note (Signed)
Addended by: Gracelyn NurseBLACKWELL, Jamilee Lafosse P on: 08/29/2017 03:22 PM   Modules accepted: Orders

## 2017-08-29 NOTE — Telephone Encounter (Signed)
Patient would like a refill for Flovent 100MCG.   [facility] [address] [place], [phone]

## 2017-09-12 ENCOUNTER — Encounter: Payer: 59 | Admitting: Women's Health

## 2017-10-05 DIAGNOSIS — M5126 Other intervertebral disc displacement, lumbar region: Secondary | ICD-10-CM | POA: Insufficient documentation

## 2017-10-19 DIAGNOSIS — M51369 Other intervertebral disc degeneration, lumbar region without mention of lumbar back pain or lower extremity pain: Secondary | ICD-10-CM | POA: Insufficient documentation

## 2017-11-13 ENCOUNTER — Encounter: Payer: Self-pay | Admitting: Internal Medicine

## 2017-11-13 ENCOUNTER — Ambulatory Visit (INDEPENDENT_AMBULATORY_CARE_PROVIDER_SITE_OTHER): Payer: 59 | Admitting: Internal Medicine

## 2017-11-13 VITALS — BP 100/70 | HR 115 | Temp 99.4°F | Ht 67.5 in | Wt 125.4 lb

## 2017-11-13 DIAGNOSIS — J069 Acute upper respiratory infection, unspecified: Secondary | ICD-10-CM

## 2017-11-13 DIAGNOSIS — B9789 Other viral agents as the cause of diseases classified elsewhere: Secondary | ICD-10-CM

## 2017-11-13 MED ORDER — HYDROCODONE-HOMATROPINE 5-1.5 MG/5ML PO SYRP: 5.0000 mL | ORAL_SOLUTION | Freq: Four times a day (QID) | ORAL | 0 refills | Status: DC | PRN

## 2017-11-13 NOTE — Patient Instructions (Addendum)
Call or return to clinic prn if these symptoms worsen or fail to improve as anticipated.  Hydrate and Humidify  Drink enough water to keep your urine clear or pale yellow. Staying hydrated will help to thin your mucus.  Use a cool mist humidifier to keep the humidity level in your home above 50%.  Inhale steam for 10-15 minutes, 3-4 times a day or as told by your health care provider. You can do this in the bathroom while a hot shower is running.  Limit your exposure to cool or dry air. Rest  Rest as much as possible.  Advil as directed   Take [phone] mg of Tylenol every 6 hours as needed for pain relief or fever.  Avoid taking more than 3000 mg in a 24-hour period (  This may cause liver damage).

## 2017-11-13 NOTE — Progress Notes (Signed)
Subjective:    Patient ID: [name], female    DOB: [birth year], 52 y.o.   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  She states that she has had strep pharyngitis in the past Other complaints include some sinus congestion  Past Medical History:  Diagnosis Date  . Medical history non-contributory   . Pelvic pain      Social History   Socioeconomic History  . Marital status: Divorced    Spouse name: Not on file  . Number of children: Not on file  . Years of education: Not on file  . Highest education level: Not on file  Social Needs  . Financial resource strain: Not on file  . Food insecurity - worry: Not on file  . Food insecurity - inability: Not on file  . Transportation needs - medical: Not on file  . Transportation needs - non-medical: Not on file  Occupational History  . Not on file  Tobacco Use  . Smoking status: Never Smoker  . Smokeless tobacco: Never Used  Substance and Sexual Activity  . Alcohol use: Yes    Alcohol/week: 0.6 oz    Types: 1 Glasses of wine per week    Comment: rarely   . Drug use: No  . Sexual activity: Not Currently  Other Topics Concern  . Not on file  Social History Narrative  . Not on file    Past Surgical History:  Procedure Laterality Date  . CERVICAL BIOPSY  W/ LOOP ELECTRODE EXCISION     NOV 2014  . CHOLECYSTECTOMY    . LAPAROSCOPIC SALPINGO OOPHERECTOMY Bilateral 03/16/2017   Procedure: LAPAROSCOPIC SALPINGO OOPHORECTOMY;  Surgeon: Dara Lords, MD;  Location: [facility];  Service: Gynecology;  Laterality: Bilateral;  . PELVIC LAPAROSCOPY      Family History  Problem Relation Age of Onset  . Hypertension Mother   . Breast cancer Other        GREAT AUNT    No Known Allergies  Current Outpatient Medications on File Prior to Visit  Medication Sig  Dispense Refill  . ALPRAZolam (XANAX) 1 MG tablet Take 1 tablet (1 mg total) by mouth at bedtime. 30 tablet 2  . diphenhydrAMINE (BENADRYL) 25 MG tablet Take 25 mg by mouth every 6 (six) hours as needed.    Marland Kitchen FLUTICASONE FUROATE NA Place 50 mcg into the nose.    Marland Kitchen Fluticasone Propionate, Inhal, (FLOVENT DISKUS) 100 MCG/BLIST AEPB Inhale 100 mcg into the lungs daily. 60 each 3  . ibuprofen (ADVIL,MOTRIN) 600 MG tablet Take 1 tablet (600 mg total) by mouth every 8 (eight) hours as needed. 60 tablet 1  . montelukast (SINGULAIR) 10 MG tablet Take 1 tablet (10 mg total) by mouth at bedtime. 30 tablet 5  . Multiple Vitamin (MULTIVITAMIN) tablet Take 1 tablet by mouth daily.    . ondansetron (ZOFRAN) 4 MG tablet Take 1 tablet (4 mg total) by mouth every 8 (eight) hours as needed for nausea or vomiting. 20 tablet 3  . rizatriptan (MAXALT-MLT) 10 MG disintegrating tablet Take 1 tablet (10 mg total) by mouth as needed for migraine. May repeat in 2 hours if needed 10 tablet 3  . valACYclovir (VALTREX) 500 MG tablet Take 1 tablet (500 mg total) by mouth 2 (two) times daily. 60 tablet 3   No current facility-administered medications on file  prior to visit.     BP 100/70 (BP Location: Right Arm, Patient Position: Sitting, Cuff Size: Normal)   Pulse (!) 115   Temp 99.4 F (37.4 C) (Oral)   Ht 5' 7.5" (1.715 m)   Wt 125 lb 6.4 oz (56.9 kg)   LMP 02/19/2009   SpO2 97%   BMI 19.35 kg/m     Review of Systems  Constitutional: Positive for activity change, appetite change, chills, fatigue and fever.  HENT: Positive for congestion, sinus pressure and sore throat. Negative for dental problem, hearing loss, rhinorrhea and tinnitus.   Eyes: Negative for pain, discharge and visual disturbance.  Respiratory: Positive for cough. Negative for shortness of breath.   Cardiovascular: Negative for chest pain, palpitations and leg swelling.  Gastrointestinal: Negative for abdominal distention, abdominal pain, blood  in stool, constipation, diarrhea, nausea and vomiting.  Genitourinary: Negative for difficulty urinating, dysuria, flank pain, frequency, hematuria, pelvic pain, urgency, vaginal bleeding, vaginal discharge and vaginal pain.  Musculoskeletal: Negative for arthralgias, gait problem and joint swelling.  Skin: Negative for rash.  Neurological: Negative for dizziness, syncope, speech difficulty, weakness, numbness and headaches.  Hematological: Negative for adenopathy.  Psychiatric/Behavioral: Negative for agitation, behavioral problems and dysphoric mood. The patient is not nervous/anxious.        Objective:   Physical Exam  Constitutional: She is oriented to person, place, and time. She appears well-developed and well-nourished. No distress.  Appears weak but no distress Low-grade fever  HENT:  Head: Normocephalic.  Right Ear: External ear normal.  Left Ear: External ear normal.  Erythema of the oropharynx without exudate  Eyes: Conjunctivae and EOM are normal. Pupils are equal, round, and reactive to light.  Neck: Normal range of motion. Neck supple. No thyromegaly present.  Cardiovascular: Normal rate, regular rhythm, normal heart sounds and intact distal pulses.  Pulmonary/Chest: Effort normal and breath sounds normal. No respiratory distress. She has no wheezes. She has no rales.  Abdominal: Soft. Bowel sounds are normal. She exhibits no mass. There is no tenderness.  Musculoskeletal: Normal range of motion.  Lymphadenopathy:    She has no cervical adenopathy.  Neurological: She is alert and oriented to person, place, and time.  Skin: Skin is warm and dry. No rash noted.  Psychiatric: She has a normal mood and affect. Her behavior is normal.          Assessment & Plan:   Viral URI with pharyngitis.  Will screen for group A beta-hemolytic strep Treat symptomatically  Rogelia BogaKWIATKOWSKI,Lorna Strother FRANK

## 2017-11-14 ENCOUNTER — Other Ambulatory Visit: Payer: Self-pay | Admitting: Family Medicine

## 2017-11-15 NOTE — Telephone Encounter (Signed)
Last OV 08/21/2017  Last refilled 08/21/2017 disp 30 with 2 refills   Sent to PCP for approval

## 2017-11-16 ENCOUNTER — Ambulatory Visit (INDEPENDENT_AMBULATORY_CARE_PROVIDER_SITE_OTHER): Payer: 59 | Admitting: Family Medicine

## 2017-11-16 ENCOUNTER — Encounter: Payer: Self-pay | Admitting: Family Medicine

## 2017-11-16 VITALS — HR 86 | Temp 99.5°F | Wt 125.6 lb

## 2017-11-16 DIAGNOSIS — J209 Acute bronchitis, unspecified: Secondary | ICD-10-CM

## 2017-11-16 MED ORDER — METHYLPREDNISOLONE 4 MG PO TBPK: ORAL_TABLET | ORAL | 0 refills | Status: DC

## 2017-11-16 MED ORDER — AMOXICILLIN-POT CLAVULANATE 875-125 MG PO TABS: 1.0000 | ORAL_TABLET | Freq: Two times a day (BID) | ORAL | 0 refills | Status: DC

## 2017-11-16 MED ORDER — ALPRAZOLAM 1 MG PO TABS: 1.0000 mg | ORAL_TABLET | Freq: Every day | ORAL | 5 refills | Status: DC

## 2017-11-16 NOTE — Progress Notes (Signed)
   Subjective:    Patient ID: [name], female    DOB: 01/31/1966, 52 y.o.   MRN: [id number]  HPI Here for one week of stuffy head, PND, chest tightness and coughing up yellow sputum. No fever until yesterday. She was seen here on 11-13-17, and it was felt to be viral at that point.    Review of Systems  Constitutional: Positive for fever.  HENT: Positive for congestion, postnasal drip, sinus pressure, sinus pain, sore throat and voice change.   Eyes: Negative.   Respiratory: Positive for cough.   Cardiovascular: Negative.        Objective:   Physical Exam  Constitutional: She is oriented to person, place, and time. She appears well-developed and well-nourished.  HENT:  Right Ear: External ear normal.  Left Ear: External ear normal.  Nose: Nose normal.  Mouth/Throat: Oropharynx is clear and moist.  Eyes: Conjunctivae are normal.  Neck: No thyromegaly present.  Pulmonary/Chest: Effort normal. No respiratory distress. She has no wheezes. She has no rales.  Scattered rhonchi   Lymphadenopathy:    She has no cervical adenopathy.  Neurological: She is alert and oriented to person, place, and time.          Assessment & Plan:  Bronchitis, treat with Augmentin and a Medrol dose pack. Written out of work today until 11-21-17. Gershon CraneStephen Fry, MD

## 2017-11-17 NOTE — Telephone Encounter (Signed)
What med is this about?

## 2017-11-23 ENCOUNTER — Telehealth: Payer: Self-pay | Admitting: Family Medicine

## 2017-11-23 ENCOUNTER — Encounter: Payer: Self-pay | Admitting: Family Medicine

## 2017-11-23 ENCOUNTER — Ambulatory Visit: Payer: 59 | Admitting: Family Medicine

## 2017-11-23 DIAGNOSIS — Z0289 Encounter for other administrative examinations: Secondary | ICD-10-CM

## 2017-11-23 NOTE — Telephone Encounter (Signed)
Copied from CRM [phone]. Topic: Quick Communication - See Telephone Encounter >> Nov 23, 2017  9:00 AM Jolayne Hainesaylor, Brittany L wrote: CRM for notification. See Telephone encounter for:   11/23/17.   Patient wants to know if Dr Clent RidgesFry will refill a medicine for her that another dr gave her. Its the coughing tessalon pearls. She got in late from work last night and slept through her alarm for her appt this am. She has some now and is fixing to run out. 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 [facility] [address] [address] [facility] [place] - [address] [phone]

## 2017-11-23 NOTE — Telephone Encounter (Signed)
Please advise 

## 2017-11-24 MED ORDER — BENZONATATE 200 MG PO CAPS: 200.0000 mg | ORAL_CAPSULE | Freq: Two times a day (BID) | ORAL | 1 refills | Status: DC | PRN

## 2017-11-24 NOTE — Telephone Encounter (Signed)
Call in Benzonatate 200 mg to take bid prn cough, #60 with one rf 

## 2017-11-24 NOTE — Telephone Encounter (Signed)
I already sent this in °

## 2017-12-06 ENCOUNTER — Ambulatory Visit (INDEPENDENT_AMBULATORY_CARE_PROVIDER_SITE_OTHER): Payer: 59 | Admitting: Family Medicine

## 2017-12-06 ENCOUNTER — Encounter: Payer: Self-pay | Admitting: Family Medicine

## 2017-12-06 VITALS — BP 120/70 | HR 71 | Temp 98.4°F | Wt 125.2 lb

## 2017-12-06 DIAGNOSIS — Z9109 Other allergy status, other than to drugs and biological substances: Secondary | ICD-10-CM | POA: Insufficient documentation

## 2017-12-06 DIAGNOSIS — J3089 Other allergic rhinitis: Secondary | ICD-10-CM | POA: Diagnosis not present

## 2017-12-06 DIAGNOSIS — J4520 Mild intermittent asthma, uncomplicated: Secondary | ICD-10-CM | POA: Diagnosis not present

## 2017-12-06 DIAGNOSIS — J45909 Unspecified asthma, uncomplicated: Secondary | ICD-10-CM | POA: Insufficient documentation

## 2017-12-06 MED ORDER — ALBUTEROL SULFATE (2.5 MG/3ML) 0.083% IN NEBU: 2.5000 mg | INHALATION_SOLUTION | RESPIRATORY_TRACT | 1 refills | Status: DC | PRN

## 2017-12-06 MED ORDER — ALBUTEROL SULFATE HFA 108 (90 BASE) MCG/ACT IN AERS
2.0000 | INHALATION_SPRAY | RESPIRATORY_TRACT | 0 refills | Status: DC | PRN
Start: 2017-12-06 — End: 2018-09-21

## 2017-12-06 MED ORDER — FLUTICASONE PROPIONATE (INHAL) 100 MCG/BLIST IN AEPB: 100.0000 ug | INHALATION_SPRAY | Freq: Every day | RESPIRATORY_TRACT | 3 refills | Status: DC

## 2017-12-06 NOTE — Progress Notes (Signed)
   Subjective:    Patient ID: [name], female    DOB: 07/10/1966, 52 y.o.   MRN: [id number]  HPI Here to follow up on allergies and asthma. She was seen here on 11-16-17 for a bronchitis and was treated with Augmentin and a Medrol dose pack. She feels much better now. Also she needs FMLA papers filled out for her employer to cover intermittent leave for asthma flare ups. She is using Flovent Diskus, Flonase, Singulair, and prn Proventil.    Review of Systems  Constitutional: Negative.   HENT: Positive for congestion and postnasal drip. Negative for sinus pressure, sinus pain and sore throat.   Eyes: Negative.   Respiratory: Positive for cough, shortness of breath and wheezing.        Objective:   Physical Exam  Constitutional: She appears well-developed and well-nourished.  HENT:  Right Ear: External ear normal.  Left Ear: External ear normal.  Nose: Nose normal.  Mouth/Throat: Oropharynx is clear and moist.  Eyes: Conjunctivae are normal.  Neck: No thyromegaly present.  Pulmonary/Chest: Effort normal and breath sounds normal. No respiratory distress. She has no wheezes. She has no rales.  Lymphadenopathy:    She has no cervical adenopathy.          Assessment & Plan:  She has recovered from the recent bronchitis. For the allergies and asthma, I suggested she add an antihistamine like Claritin daily. FMLA forms were filled out. Gershon CraneStephen Terriah Reggio, MD

## 2017-12-13 DIAGNOSIS — J019 Acute sinusitis, unspecified: Secondary | ICD-10-CM | POA: Diagnosis not present

## 2017-12-14 ENCOUNTER — Other Ambulatory Visit: Payer: Self-pay | Admitting: Family Medicine

## 2017-12-14 ENCOUNTER — Encounter: Payer: Self-pay | Admitting: Family Medicine

## 2017-12-15 ENCOUNTER — Other Ambulatory Visit: Payer: Self-pay

## 2017-12-15 MED ORDER — ALBUTEROL SULFATE HFA 108 (90 BASE) MCG/ACT IN AERS: 2.0000 | INHALATION_SPRAY | Freq: Four times a day (QID) | RESPIRATORY_TRACT | 5 refills | Status: DC | PRN

## 2017-12-15 NOTE — Telephone Encounter (Signed)
Call in Ventolin inhaler #1 with 5 rf

## 2017-12-15 NOTE — Telephone Encounter (Signed)
Rx sent for  Ventolin inhaler #1 with 5 rf

## 2017-12-26 ENCOUNTER — Ambulatory Visit: Payer: 59 | Admitting: Family Medicine

## 2018-01-13 ENCOUNTER — Other Ambulatory Visit: Payer: Self-pay | Admitting: Family Medicine

## 2018-01-13 DIAGNOSIS — J3089 Other allergic rhinitis: Secondary | ICD-10-CM

## 2018-01-17 ENCOUNTER — Ambulatory Visit (INDEPENDENT_AMBULATORY_CARE_PROVIDER_SITE_OTHER): Payer: 59 | Admitting: Adult Health

## 2018-01-17 ENCOUNTER — Encounter: Payer: Self-pay | Admitting: Adult Health

## 2018-01-17 VITALS — BP 100/60 | Temp 99.5°F | Wt 124.0 lb

## 2018-01-17 DIAGNOSIS — J0140 Acute pansinusitis, unspecified: Secondary | ICD-10-CM | POA: Diagnosis not present

## 2018-01-17 MED ORDER — DOXYCYCLINE HYCLATE 100 MG PO CAPS
100.0000 mg | ORAL_CAPSULE | Freq: Two times a day (BID) | ORAL | 0 refills | Status: DC
Start: 2018-01-17 — End: 2018-04-10

## 2018-01-17 MED ORDER — BENZONATATE 200 MG PO CAPS
200.0000 mg | ORAL_CAPSULE | Freq: Two times a day (BID) | ORAL | 0 refills | Status: DC | PRN
Start: 2018-01-17 — End: 2018-03-12

## 2018-01-17 NOTE — Progress Notes (Signed)
   Subjective:    Patient ID: [name], female    DOB: 01/25/1966, 52 y.o.   MRN: [id number]  Sinusitis  This is a new problem. The current episode started in the past 7 days. The problem has been gradually worsening since onset. The maximum temperature recorded prior to her arrival was 100.4 - 100.9 F. Associated symptoms include chills, congestion, coughing, headaches, sinus pressure, a sore throat and swollen glands. Pertinent negatives include no shortness of breath. Past treatments include oral decongestants and acetaminophen. The treatment provided no relief.      Review of Systems  Constitutional: Positive for activity change, appetite change, chills, fatigue and fever.  HENT: Positive for congestion, postnasal drip, sinus pressure, sinus pain and sore throat. Negative for trouble swallowing.   Eyes: Negative.   Respiratory: Positive for cough. Negative for chest tightness, shortness of breath and wheezing.   Cardiovascular: Negative.   Gastrointestinal: Negative.   Musculoskeletal: Negative.   Neurological: Positive for headaches.  All other systems reviewed and are negative.      Objective:   Physical Exam  Constitutional: She is oriented to person, place, and time. She appears well-developed and well-nourished. No distress.  HENT:  Right Ear: Hearing, tympanic membrane, external ear and ear canal normal.  Left Ear: Hearing, tympanic membrane, external ear and ear canal normal.  Nose: Mucosal edema and rhinorrhea present. Right sinus exhibits maxillary sinus tenderness and frontal sinus tenderness. Left sinus exhibits maxillary sinus tenderness and frontal sinus tenderness.  Mouth/Throat: Uvula is midline and mucous membranes are normal. Oropharyngeal exudate present.  Cardiovascular: Normal rate, regular rhythm, normal heart sounds and intact distal pulses. Exam reveals no gallop and no friction rub.  No murmur heard. Pulmonary/Chest: Effort normal and breath  sounds normal. No respiratory distress. She has no wheezes. She has no rales. She exhibits no tenderness.  Neurological: She is alert and oriented to person, place, and time.  Skin: Skin is warm and dry. No rash noted. She is not diaphoretic. No erythema. No pallor.  Psychiatric: She has a normal mood and affect. Her behavior is normal. Judgment and thought content normal.  Nursing note and vitals reviewed.     Assessment & Plan:  1. Acute non-recurrent pansinusitis - doxycycline (VIBRAMYCIN) 100 MG capsule; Take 1 capsule (100 mg total) by mouth 2 (two) times daily.  Dispense: 14 capsule; Refill: 0 - benzonatate (TESSALON) 200 MG capsule; Take 1 capsule (200 mg total) by mouth 2 (two) times daily as needed for cough.  Dispense: 20 capsule; Refill: 0 - Stay hydrated and rest  - Follow up if no improvement in the next 2-3 days   Shirline Freesory Athalee Esterline, NP

## 2018-01-31 DIAGNOSIS — Z1231 Encounter for screening mammogram for malignant neoplasm of breast: Secondary | ICD-10-CM | POA: Diagnosis not present

## 2018-02-02 ENCOUNTER — Encounter: Payer: Self-pay | Admitting: Internal Medicine

## 2018-02-02 ENCOUNTER — Ambulatory Visit (INDEPENDENT_AMBULATORY_CARE_PROVIDER_SITE_OTHER): Payer: 59 | Admitting: Internal Medicine

## 2018-02-02 VITALS — BP 90/60 | HR 86 | Temp 98.0°F | Ht 67.5 in | Wt 128.4 lb

## 2018-02-02 DIAGNOSIS — S30861A Insect bite (nonvenomous) of abdominal wall, initial encounter: Secondary | ICD-10-CM

## 2018-02-02 DIAGNOSIS — W57XXXA Bitten or stung by nonvenomous insect and other nonvenomous arthropods, initial encounter: Secondary | ICD-10-CM | POA: Diagnosis not present

## 2018-02-02 MED ORDER — DOXYCYCLINE HYCLATE 100 MG PO TABS: ORAL_TABLET | ORAL | 0 refills | Status: DC

## 2018-02-02 NOTE — Patient Instructions (Signed)
Most tick bites are not serious  And   Risk depends on  How long attached and  Location  .      Tick Bite Information, Adult Ticks are insects that can bite. Most ticks live in shrubs and grassy areas. They climb onto people and animals that go by. Then they bite. Some ticks carry germs that can make you sick. How can I prevent tick bites?  Use an insect repellent that has 20% or higher of the ingredients DEET, picaridin, or IR3535. Put this insect repellent on: ? Bare skin. ? The tops of your boots. ? Your pant legs. ? The ends of your sleeves.  If you use an insect repellent that has the ingredient permethrin, make sure to follow the instructions on the bottle. Treat the following: ? Clothing. ? Supplies. ? Boots. ? Tents.  Wear long sleeves, long pants, and light colors.  Tuck your pant legs into your socks.  Stay in the middle of the trail.  Try not to walk through long grass.  Before going inside your house, check your clothes, hair, and skin for ticks. Make sure to check your head, neck, armpits, waist, groin, and joint areas.  Check for ticks every day.  When you come indoors: ? Wash your clothes right away. ? Shower right away. ? Dry your clothes in a dryer on high heat for 60 minutes or more. What is the right way to remove a tick? Remove a tick from your skin as soon as possible.  To remove a tick that is crawling on your skin: ? Go outdoors and brush the tick off. ? Use tape or a lint roller.  To remove a tick that is biting: ? Wash your hands. ? If you have latex gloves, put them on. ? Use tweezers, curved forceps, or a tick-removal tool to grasp the tick. Grasp the tick as close to your skin and as close to the tick's head as possible. ? Gently pull up until the tick lets go.  Try to keep the tick's head attached to its body.  Do not twist or jerk the tick.  Do not squeeze or crush the tick.  Do not try to remove a tick with heat, alcohol,  petroleum jelly, or fingernail polish. How should I get rid of a tick? Here are some ways to get rid of a tick that is alive:  Place the tick in rubbing alcohol.  Place the tick in a bag or container you can close tightly.  Wrap the tick tightly in tape.  Flush the tick down the toilet.  Contact a doctor if:  You have symptoms of a disease, such as: ? Pain in a muscle, joint, or bone. ? Trouble walking or moving your legs. ? Numbness in your legs. ? Inability to move (paralysis). ? A red rash that makes a circle (bull's-eye rash). ? Redness and swelling where the tick bit you. ? A fever. ? Throwing up (vomiting) over and over. ? Diarrhea. ? Weight loss. ? Tender and swollen lymph glands. ? Shortness of breath. ? Cough. ? Belly pain (abdominal pain). ? Headache. ? Being more tired than normal. ? A change in how alert (conscious) you are. ? Confusion. Get help right away if:  You cannot remove a tick.  A part of a tick breaks off and gets stuck in your skin.  You are feeling worse. Summary  Ticks may carry germs that can make you sick.  To prevent tick bites,  wear long sleeves, long pants, and light colors. Use insect repellent. Follow the instructions on the bottle.  If the tick is biting, do not try to remove it with heat, alcohol, petroleum jelly, or fingernail polish.  Use tweezers, curved forceps, or a tick-removal tool to grasp the tick. Gently pull up until the tick lets go. Do not twist or jerk the tick. Do not squeeze or crush the tick.  If you have symptoms, contact a doctor. This information is not intended to replace advice given to you by your health care provider. Make sure you discuss any questions you have with your health care provider. Document Released: 12/07/2009 Document Revised: 12/23/2016 Document Reviewed: 12/23/2016 Elsevier Interactive Patient Education  2018 [employer].   Need for treatment - The clinician will review the description  of the tick, along with any physical symptoms, to decide upon a course of action. The Infectious Diseases Society of America (IDSA) recommends preventive treatment with antibiotics only in people who meet ALL of the following criteria: ?Attached tick identified as an adult or nymphal Ixodes scapularis (deer) tick ?Tick is estimated to have been attached for ?36 hours (based upon how engorged the tick appears or the amount of time since outdoor exposure) ?The antibiotic can be given within 72 hours of tick removal ?The local rate of tick infection with B. burgdorferi is ?20 percent (known to occur in parts of [place], parts of the [address], and parts of [place] and [place]) ?The person can take doxycycline (eg, the person is not pregnant or breastfeeding or a child <23 years of age) If the person meets ALL of the above criteria, the recommended dose of doxycycline is a single dose of 200 mg for adults and 4 mg/kg, up to a maximum dose of 200 mg, in children ? 8 years.

## 2018-02-02 NOTE — Progress Notes (Signed)
Chief Complaint  Patient presents with  . Tick Removal    patient is concerned due to tick she removed from her abdomen this Am    HPI: [name] 52 y.o. sda     Noted this am  Alive  And tiny     Hx of past lyme and tick related disease  [place] with [place]   Thinks   Part of tick left in  Hard to get out.   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  fyi off buspar   Using  otc l tryp helps  Smooth out anxiety   Past Medical History:  Diagnosis Date  . Medical history non-contributory   . Pelvic pain     Family History  Problem Relation Age of Onset  . Hypertension Mother   . Breast cancer Other        GREAT AUNT    Social History   Socioeconomic History  . Marital status: Divorced    Spouse name: Not on file  . Number of children: Not on file  . Years of education: Not on file  . Highest education level: Not on file  Occupational History  . Not on file  Social Needs  . Financial resource strain: Not on file  . Food insecurity:    Worry: Not on file    Inability: Not on file  . Transportation needs:    Medical: Not on file    Non-medical: Not on file  Tobacco Use  . Smoking status: Never Smoker  . Smokeless tobacco: Never Used  Substance and Sexual Activity  . Alcohol use: Yes    Alcohol/week: 0.6 oz    Types: 1 Glasses of wine per week    Comment: rarely   . Drug use: No  . Sexual activity: Not Currently  Lifestyle  . Physical activity:    Days per week: Not on file    Minutes per session: Not on file  . Stress: Not on file  Relationships  . Social connections:    Talks on phone: Not on file    Gets together: Not on file    Attends religious service: Not on file    Active member of club or organization: Not on file    Attends meetings of clubs or organizations: Not on file    Relationship status: Not on file  Other Topics Concern  . Not on file    Social History Narrative  . Not on file    Outpatient Medications Prior to Visit  Medication Sig Dispense Refill  . albuterol (PROVENTIL HFA;VENTOLIN HFA) 108 (90 Base) MCG/ACT inhaler Inhale 2 puffs into the lungs every 4 (four) hours as needed for wheezing or shortness of breath. 1 Inhaler 0  . albuterol (PROVENTIL HFA;VENTOLIN HFA) 108 (90 Base) MCG/ACT inhaler Inhale 2 puffs into the lungs every 6 (six) hours as needed for wheezing or shortness of breath. 1 Inhaler 5  . ALPRAZolam (XANAX) 1 MG tablet Take 1 tablet (1 mg total) by mouth at bedtime. 30 tablet 5  . benzonatate (TESSALON) 200 MG capsule Take 1 capsule (200 mg total) by mouth 2 (two) times daily as needed for cough. 60 capsule 1  . benzonatate (TESSALON) 200 MG capsule Take 1 capsule (200 mg total) by mouth 2 (two) times daily as needed for cough. 20 capsule 0  . diphenhydrAMINE (BENADRYL) 25  MG tablet Take 25 mg by mouth every 6 (six) hours as needed.    . doxycycline (VIBRAMYCIN) 100 MG capsule Take 1 capsule (100 mg total) by mouth 2 (two) times daily. 14 capsule 0  . FLOVENT DISKUS 100 MCG/BLIST AEPB INHALE 100 MCG INTO THE LUNGS DAILY. 60 each 0  . FLUTICASONE FUROATE NA Place 50 mcg into the nose.    . ibuprofen (ADVIL,MOTRIN) 600 MG tablet Take 1 tablet (600 mg total) by mouth every 8 (eight) hours as needed. 60 tablet 1  . montelukast (SINGULAIR) 10 MG tablet Take 1 tablet (10 mg total) by mouth at bedtime. 30 tablet 5  . Multiple Vitamin (MULTIVITAMIN) tablet Take 1 tablet by mouth daily.    . valACYclovir (VALTREX) 500 MG tablet Take 1 tablet (500 mg total) by mouth 2 (two) times daily. 60 tablet 3  . busPIRone (BUSPAR) 10 MG tablet buspirone 10 mg tablet  TAKE 1/2 TABLET BY MOUTH TWICE A DAY     No facility-administered medications prior to visit.      EXAM:  BP 90/60 (BP Location: Left Arm, Patient Position: Sitting, Cuff Size: Normal)   Pulse 86   Temp 98 F (36.7 C) (Oral)   Ht 5' 7.5" (1.715 m)   Wt  128 lb 6.4 oz (58.2 kg)   LMP 02/19/2009   BMI 19.81 kg/m   Body mass index is 19.81 kg/m.  GENERAL: vitals reviewed and listed above, alert, oriented, appears well hydrated and in no acute distress HEENT: atraumatic, conjunctiva  clear, no obvious abnormalities on inspection of external nose and ears   in right groin   tinu speck  attempted to  Extract with tweezers   And needle 25 gauge but unsuccessful    Covered with antibiotic   And Band-Aid PSYCH: pleasant and cooperative, no obvious depression or anxiety  ASSESSMENT AND PLAN:  Discussed the following assessment and plan:  No diagnosis found. Tick bite local care  Options for prophylaxis  Given    Fu if needed aware of prevention .   May be a lone start tick ? Small 2 mm ?  -Patient advised to return or notify health care team  if symptoms worsen ,persist or new concerns arise.  Patient Instructions  Most tick bites are not serious  And   Risk depends on  How long attached and  Location  .      Tick Bite Information, Adult Ticks are insects that can bite. Most ticks live in shrubs and grassy areas. They climb onto people and animals that go by. Then they bite. Some ticks carry germs that can make you sick. How can I prevent tick bites?  Use an insect repellent that has 20% or higher of the ingredients DEET, picaridin, or IR3535. Put this insect repellent on: ? Bare skin. ? The tops of your boots. ? Your pant legs. ? The ends of your sleeves.  If you use an insect repellent that has the ingredient permethrin, make sure to follow the instructions on the bottle. Treat the following: ? Clothing. ? Supplies. ? Boots. ? Tents.  Wear long sleeves, long pants, and light colors.  Tuck your pant legs into your socks.  Stay in the middle of the trail.  Try not to walk through long grass.  Before going inside your house, check your clothes, hair, and skin for ticks. Make sure to check your head, neck, armpits, waist,  groin, and joint areas.  Check for ticks every day.  When you come indoors: ? Wash your clothes right away. ? Shower right away. ? Dry your clothes in a dryer on high heat for 60 minutes or more. What is the right way to remove a tick? Remove a tick from your skin as soon as possible.  To remove a tick that is crawling on your skin: ? Go outdoors and brush the tick off. ? Use tape or a lint roller.  To remove a tick that is biting: ? Wash your hands. ? If you have latex gloves, put them on. ? Use tweezers, curved forceps, or a tick-removal tool to grasp the tick. Grasp the tick as close to your skin and as close to the tick's head as possible. ? Gently pull up until the tick lets go.  Try to keep the tick's head attached to its body.  Do not twist or jerk the tick.  Do not squeeze or crush the tick.  Do not try to remove a tick with heat, alcohol, petroleum jelly, or fingernail polish. How should I get rid of a tick? Here are some ways to get rid of a tick that is alive:  Place the tick in rubbing alcohol.  Place the tick in a bag or container you can close tightly.  Wrap the tick tightly in tape.  Flush the tick down the toilet.  Contact a doctor if:  You have symptoms of a disease, such as: ? Pain in a muscle, joint, or bone. ? Trouble walking or moving your legs. ? Numbness in your legs. ? Inability to move (paralysis). ? A red rash that makes a circle (bull's-eye rash). ? Redness and swelling where the tick bit you. ? A fever. ? Throwing up (vomiting) over and over. ? Diarrhea. ? Weight loss. ? Tender and swollen lymph glands. ? Shortness of breath. ? Cough. ? Belly pain (abdominal pain). ? Headache. ? Being more tired than normal. ? A change in how alert (conscious) you are. ? Confusion. Get help right away if:  You cannot remove a tick.  A part of a tick breaks off and gets stuck in your skin.  You are feeling worse. Summary  Ticks may carry  germs that can make you sick.  To prevent tick bites, wear long sleeves, long pants, and light colors. Use insect repellent. Follow the instructions on the bottle.  If the tick is biting, do not try to remove it with heat, alcohol, petroleum jelly, or fingernail polish.  Use tweezers, curved forceps, or a tick-removal tool to grasp the tick. Gently pull up until the tick lets go. Do not twist or jerk the tick. Do not squeeze or crush the tick.  If you have symptoms, contact a doctor. This information is not intended to replace advice given to you by your health care provider. Make sure you discuss any questions you have with your health care provider. Document Released: 12/07/2009 Document Revised: 12/23/2016 Document Reviewed: 12/23/2016 Elsevier Interactive Patient Education  2018 [employer].   Need for treatment - The clinician will review the description of the tick, along with any physical symptoms, to decide upon a course of action. The Infectious Diseases Society of America (IDSA) recommends preventive treatment with antibiotics only in people who meet ALL of the following criteria: ?Attached tick identified as an adult or nymphal Ixodes scapularis (deer) tick ?Tick is estimated to have been attached for ?36 hours (based upon how engorged the tick appears or the amount of time since outdoor exposure) ?  The antibiotic can be given within 72 hours of tick removal ?The local rate of tick infection with B. burgdorferi is ?20 percent (known to occur in parts of [place], parts of the [address], and parts of [place] and [place]) ?The person can take doxycycline (eg, the person is not pregnant or breastfeeding or a child <58 years of age) If the person meets ALL of the above criteria, the recommended dose of doxycycline is a single dose of 200 mg for adults and 4 mg/kg, up to a maximum dose of 200 mg, in children ? 8 years.     Neta Mends. Panosh M.D.

## 2018-02-05 ENCOUNTER — Encounter: Payer: 59 | Admitting: Family Medicine

## 2018-02-06 ENCOUNTER — Other Ambulatory Visit: Payer: Self-pay | Admitting: Family Medicine

## 2018-02-06 DIAGNOSIS — B009 Herpesviral infection, unspecified: Secondary | ICD-10-CM

## 2018-02-07 ENCOUNTER — Ambulatory Visit: Payer: Self-pay

## 2018-02-07 NOTE — Telephone Encounter (Signed)
Per Dr. Salomon Fick, patient transferred to you. Okay to refill? You have not filled this for patient before.

## 2018-02-07 NOTE — Telephone Encounter (Signed)
Pt calling with bilateral eye discharge that started 2 days ago. Pt states eyelids are swollen and she is waking up with eyelids crusted. Pt wanting Rx called in. Called pt back to inform her she would need appt. Pt states she will call back to make appt.  Reason for Disposition . [1] Eye with yellow/green discharge or eyelashes stick together AND [2] NO PCP standing order to call in antibiotic eye drops  Answer Assessment - Initial Assessment Questions 1. EYE DISCHARGE: "Is the discharge in one or both eyes?" "What color is it?" "How much is there?" "When did the discharge start?"      Both eyes-started 2 days ago-eyelashes crusted when she wakes up in the am 2. REDNESS OF SCLERA: "Is the redness in one or both eyes?" "When did the redness start?"      Yes  3. EYELIDS: "Are the eyelids red or swollen?" If so, ask: "How much?"      yes 4. VISION: "Is there any difficulty seeing clearly?"      no 5. PAIN: "Is there any pain? If so, ask: "How bad is it?" (Scale 1-10; or mild, moderate, severe)    - MILD (1-3): doesn't interfere with normal activities     - MODERATE (4-7): interferes with normal activities or awakens from sleep    - SEVERE (8-10): excruciating pain, unable to do any normal activities       no 6. CONTACT LENS: "Do you wear contacts?"     no 7. OTHER SYMPTOMS: "Do you have any other symptoms?" (e.g., fever, runny nose, cough)     no 8. PREGNANCY: "Is there any chance you are pregnant?" "When was your last menstrual period?"     n/a  Protocols used: EYE - PUS OR DISCHARGE-A-AH

## 2018-02-07 NOTE — Telephone Encounter (Signed)
Yes please call in these refills

## 2018-02-07 NOTE — Telephone Encounter (Signed)
Please advise.  PCP is listed as Dr. Clent Ridges but patient established with you and I do not see that she has transferred.

## 2018-02-08 NOTE — Telephone Encounter (Signed)
Medication filled to pharmacy as requested.   

## 2018-02-12 ENCOUNTER — Encounter: Payer: 59 | Admitting: Family Medicine

## 2018-02-17 ENCOUNTER — Other Ambulatory Visit: Payer: Self-pay | Admitting: Family Medicine

## 2018-02-17 DIAGNOSIS — J3089 Other allergic rhinitis: Secondary | ICD-10-CM

## 2018-03-12 ENCOUNTER — Ambulatory Visit (INDEPENDENT_AMBULATORY_CARE_PROVIDER_SITE_OTHER): Payer: 59 | Admitting: Family Medicine

## 2018-03-12 ENCOUNTER — Encounter: Payer: Self-pay | Admitting: Family Medicine

## 2018-03-12 VITALS — BP 98/70 | HR 73 | Temp 98.5°F | Ht 67.5 in | Wt 125.4 lb

## 2018-03-12 DIAGNOSIS — R5382 Chronic fatigue, unspecified: Secondary | ICD-10-CM | POA: Diagnosis not present

## 2018-03-12 DIAGNOSIS — J019 Acute sinusitis, unspecified: Secondary | ICD-10-CM

## 2018-03-12 MED ORDER — AMOXICILLIN-POT CLAVULANATE 875-125 MG PO TABS: 1.0000 | ORAL_TABLET | Freq: Two times a day (BID) | ORAL | 0 refills | Status: DC

## 2018-03-12 NOTE — Progress Notes (Signed)
   Subjective:    Patient ID: [name], female    DOB: 06/12/1966, 52 y.o.   MRN: [id number]  HPI Here for one week of stuffy head, right ear pain, ST, and a dry cough. No fever.    Review of Systems  Constitutional: Negative.   HENT: Positive for congestion, ear pain, postnasal drip, sinus pressure, sinus pain and sore throat. Negative for ear discharge and hearing loss.   Eyes: Negative.   Respiratory: Positive for cough.        Objective:   Physical Exam  Constitutional: She appears well-developed and well-nourished.  HENT:  Right Ear: External ear normal.  Left Ear: External ear normal.  Nose: Nose normal.  Mouth/Throat: Oropharynx is clear and moist.  Eyes: Conjunctivae are normal.  Neck: No thyromegaly present.  Pulmonary/Chest: Effort normal and breath sounds normal. No stridor. No respiratory distress. She has no wheezes. She has no rales.  Lymphadenopathy:    She has no cervical adenopathy.          Assessment & Plan:  Sinusitis, treat with Augmentin. Add Mucinex and Ibuprofen prn. She will return for a well exam later this week.  Gershon CraneStephen Fry, MD

## 2018-03-15 ENCOUNTER — Ambulatory Visit (INDEPENDENT_AMBULATORY_CARE_PROVIDER_SITE_OTHER): Payer: 59 | Admitting: Family Medicine

## 2018-03-15 ENCOUNTER — Encounter: Payer: Self-pay | Admitting: Family Medicine

## 2018-03-15 VITALS — BP 114/80 | HR 81 | Temp 98.1°F | Ht 66.75 in | Wt 122.2 lb

## 2018-03-15 DIAGNOSIS — M810 Age-related osteoporosis without current pathological fracture: Secondary | ICD-10-CM | POA: Diagnosis not present

## 2018-03-15 DIAGNOSIS — R5382 Chronic fatigue, unspecified: Secondary | ICD-10-CM

## 2018-03-15 DIAGNOSIS — J4520 Mild intermittent asthma, uncomplicated: Secondary | ICD-10-CM

## 2018-03-15 DIAGNOSIS — Z0000 Encounter for general adult medical examination without abnormal findings: Secondary | ICD-10-CM

## 2018-03-15 DIAGNOSIS — J3089 Other allergic rhinitis: Secondary | ICD-10-CM | POA: Diagnosis not present

## 2018-03-15 LAB — HEPATIC FUNCTION PANEL
ALK PHOS: 97 U/L (ref 39–117)
ALT: 9 U/L (ref 0–35)
AST: 13 U/L (ref 0–37)
Albumin: 4.3 g/dL (ref 3.5–5.2)
BILIRUBIN DIRECT: 0.1 mg/dL (ref 0.0–0.3)
TOTAL PROTEIN: 7.1 g/dL (ref 6.0–8.3)
Total Bilirubin: 0.3 mg/dL (ref 0.2–1.2)

## 2018-03-15 LAB — CBC WITH DIFFERENTIAL/PLATELET
BASOS PCT: 0.7 % (ref 0.0–3.0)
Basophils Absolute: 0 10*3/uL (ref 0.0–0.1)
EOS PCT: 3.2 % (ref 0.0–5.0)
Eosinophils Absolute: 0.1 10*3/uL (ref 0.0–0.7)
HEMATOCRIT: 41.9 % (ref 36.0–46.0)
HEMOGLOBIN: 14.2 g/dL (ref 12.0–15.0)
Lymphocytes Relative: 32.6 % (ref 12.0–46.0)
Lymphs Abs: 1.3 10*3/uL (ref 0.7–4.0)
MCHC: 33.9 g/dL (ref 30.0–36.0)
MCV: 89 fl (ref 78.0–100.0)
MONO ABS: 0.5 10*3/uL (ref 0.1–1.0)
MONOS PCT: 11.1 % (ref 3.0–12.0)
Neutro Abs: 2.2 10*3/uL (ref 1.4–7.7)
Neutrophils Relative %: 52.4 % (ref 43.0–77.0)
Platelets: 290 10*3/uL (ref 150.0–400.0)
RBC: 4.71 Mil/uL (ref 3.87–5.11)
RDW: 13.6 % (ref 11.5–15.5)
WBC: 4.1 10*3/uL (ref 4.0–10.5)

## 2018-03-15 LAB — POC URINALSYSI DIPSTICK (AUTOMATED)
Bilirubin, UA: POSITIVE
Blood, UA: NEGATIVE
GLUCOSE UA: NEGATIVE
Ketones, UA: NEGATIVE
LEUKOCYTES UA: NEGATIVE
Nitrite, UA: NEGATIVE
Protein, UA: NEGATIVE
SPEC GRAV UA: 1.025 (ref 1.010–1.025)
UROBILINOGEN UA: 0.2 U/dL
pH, UA: 6 (ref 5.0–8.0)

## 2018-03-15 LAB — BASIC METABOLIC PANEL
BUN: 19 mg/dL (ref 6–23)
CALCIUM: 9.9 mg/dL (ref 8.4–10.5)
CO2: 27 mEq/L (ref 19–32)
Chloride: 105 mEq/L (ref 96–112)
Creatinine, Ser: 0.83 mg/dL (ref 0.40–1.20)
GFR: 77.2 mL/min (ref >60.00)
Glucose, Bld: 89 mg/dL (ref 70–99)
Potassium: 4.1 mEq/L (ref 3.5–5.1)
SODIUM: 142 meq/L (ref 135–145)

## 2018-03-15 LAB — T3, FREE: T3 FREE: 3.9 pg/mL (ref 2.3–4.2)

## 2018-03-15 LAB — T4, FREE: Free T4: 0.79 ng/dL (ref 0.60–1.60)

## 2018-03-15 LAB — TSH: TSH: 1.63 u[IU]/mL (ref 0.35–4.50)

## 2018-03-15 LAB — LIPID PANEL
CHOL/HDL RATIO: 3
Cholesterol: 210 mg/dL — ABNORMAL HIGH (ref 0–200)
HDL: 69.5 mg/dL (ref >39.00)
LDL CALC: 121 mg/dL — AB (ref 0–99)
NonHDL: 140.05
Triglycerides: 93 mg/dL (ref 0.0–149.0)
VLDL: 18.6 mg/dL (ref 0.0–40.0)

## 2018-03-15 LAB — VITAMIN D 25 HYDROXY (VIT D DEFICIENCY, FRACTURES): VITD: 66.4 ng/mL (ref 30.00–100.00)

## 2018-03-15 LAB — VITAMIN B12: Vitamin B-12: 372 pg/mL (ref 211–911)

## 2018-03-15 MED ORDER — MONTELUKAST SODIUM 10 MG PO TABS: 10.0000 mg | ORAL_TABLET | Freq: Every day | ORAL | 3 refills | Status: DC

## 2018-03-15 NOTE — Progress Notes (Signed)
   Subjective:    Patient ID: [name], female    DOB: 02/03/1966, 52 y.o.   MRN: [id number]  HPI Here for a well exam and with some other issues. We saw her a few days ago for a sinusitis, and she is taking Augmentin for that. She feels about the same today. Also for several months she has felt very fatigued all the time, and this is unusual for her. She and her coworkers were given newly designed outfits to wear at work recently (she is a Financial controllerflight attendant), and many of them have had similar symptoms. These outfits are made with nanotechnology to resist stains, and she wonders if chemicals have been leaching into her skin. She asks to be tested for heavy metal exposure.    Review of Systems  Constitutional: Positive for fatigue.  HENT: Negative.   Eyes: Negative.   Respiratory: Negative.   Cardiovascular: Negative.   Gastrointestinal: Negative.   Genitourinary: Negative for decreased urine volume, difficulty urinating, dyspareunia, dysuria, enuresis, flank pain, frequency, hematuria, pelvic pain and urgency.  Musculoskeletal: Negative.   Skin: Negative.   Neurological: Negative.   Psychiatric/Behavioral: Negative.        Objective:   Physical Exam  Constitutional: She is oriented to person, place, and time. She appears well-developed and well-nourished. No distress.  HENT:  Head: Normocephalic and atraumatic.  Right Ear: External ear normal.  Left Ear: External ear normal.  Nose: Nose normal.  Mouth/Throat: Oropharynx is clear and moist. No oropharyngeal exudate.  Eyes: Pupils are equal, round, and reactive to light. Conjunctivae and EOM are normal. No scleral icterus.  Neck: Normal range of motion. Neck supple. No JVD present. No thyromegaly present.  Cardiovascular: Normal rate, regular rhythm, normal heart sounds and intact distal pulses. Exam reveals no gallop and no friction rub.  No murmur heard. Pulmonary/Chest: Effort normal and breath sounds normal. No  respiratory distress. She has no wheezes. She has no rales. She exhibits no tenderness.  Abdominal: Soft. Bowel sounds are normal. She exhibits no distension and no mass. There is no tenderness. There is no rebound and no guarding.  Musculoskeletal: Normal range of motion. She exhibits no edema or tenderness.  Lymphadenopathy:    She has no cervical adenopathy.  Neurological: She is alert and oriented to person, place, and time. She has normal reflexes. She displays normal reflexes. No cranial nerve deficit. She exhibits normal muscle tone. Coordination normal.  Skin: Skin is warm and dry. No rash noted. No erythema.  Psychiatric: She has a normal mood and affect. Her behavior is normal. Judgment and thought content normal.          Assessment & Plan:  Well exam. We discussed diet and exercise. Get fasting labs. At her request we will order a heavy metal screen as well. Set up for a DEXA and a colonoscopy. Gershon CraneStephen Fry, MD

## 2018-03-17 ENCOUNTER — Other Ambulatory Visit: Payer: Self-pay | Admitting: Family Medicine

## 2018-03-17 DIAGNOSIS — G43809 Other migraine, not intractable, without status migrainosus: Secondary | ICD-10-CM

## 2018-03-17 LAB — HEAVY METALS PROFILE II, BLOOD

## 2018-03-19 ENCOUNTER — Encounter: Payer: Self-pay | Admitting: Family Medicine

## 2018-03-19 NOTE — Addendum Note (Signed)
Addended by: Gershon CraneFRY, STEPHEN A on: 03/19/2018 02:05 PM   Modules accepted: Orders

## 2018-03-20 NOTE — Telephone Encounter (Signed)
Dr Fry pt 

## 2018-03-21 NOTE — Telephone Encounter (Signed)
Last OV 03/15/2018   Medication is NOT listed on pt's current medication list   Last refilled 07/15/2017

## 2018-03-27 ENCOUNTER — Encounter: Payer: Self-pay | Admitting: Family Medicine

## 2018-03-27 NOTE — Telephone Encounter (Signed)
Pt calling in asking to pick up 24-Hr Urine Jug Pt is asking if she needs to be taking a Chelating agent prior to the test.  Per Megan in the lab, Pt is going to call back first of next week and pick up the jug next week.   Will send to Dr Clent RidgesFry to advise.   Please let Megan/Nana/Stephanie in the lab know once we have a response. Thanks!

## 2018-03-30 NOTE — Telephone Encounter (Signed)
Tell her, No, she should not use a chelation agent prior to the urine test because this could mask the results

## 2018-04-02 ENCOUNTER — Encounter: Payer: Self-pay | Admitting: Family Medicine

## 2018-04-03 ENCOUNTER — Encounter: Payer: 59 | Admitting: Women's Health

## 2018-04-03 NOTE — Telephone Encounter (Signed)
Please ask [name] to find out what the out of pocket cost would be for a 24 hour urine for heavy metal testing?

## 2018-04-04 ENCOUNTER — Encounter: Payer: Self-pay | Admitting: Family Medicine

## 2018-04-04 NOTE — Telephone Encounter (Signed)
Noted. I will cancel the order.

## 2018-04-10 ENCOUNTER — Ambulatory Visit (INDEPENDENT_AMBULATORY_CARE_PROVIDER_SITE_OTHER): Payer: 59 | Admitting: Family Medicine

## 2018-04-10 ENCOUNTER — Encounter: Payer: Self-pay | Admitting: Family Medicine

## 2018-04-10 ENCOUNTER — Other Ambulatory Visit: Payer: 59

## 2018-04-10 VITALS — BP 96/60 | HR 72 | Temp 98.4°F | Ht 66.75 in | Wt 124.4 lb

## 2018-04-10 DIAGNOSIS — Z77018 Contact with and (suspected) exposure to other hazardous metals: Secondary | ICD-10-CM | POA: Diagnosis not present

## 2018-04-10 DIAGNOSIS — J019 Acute sinusitis, unspecified: Secondary | ICD-10-CM | POA: Diagnosis not present

## 2018-04-10 MED ORDER — AMOXICILLIN-POT CLAVULANATE 875-125 MG PO TABS: 1.0000 | ORAL_TABLET | Freq: Two times a day (BID) | ORAL | 0 refills | Status: DC

## 2018-04-10 NOTE — Progress Notes (Signed)
   Subjective:    Patient ID: [name], female    DOB: 12/18/1965, 52 y.o.   MRN: [id number]  HPI Here for what she thinks is another sinus infection. After flying last weekend she now has sinus congestion, PND, ST, and a dry cough. 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 She now wants to proceed with heavy metal testing.   Review of Systems  Constitutional: Negative.   HENT: Positive for congestion, postnasal drip, sinus pressure and sore throat.   Eyes: Negative.   Respiratory: Positive for cough.        Objective:   Physical Exam  Constitutional: She is oriented to person, place, and time. She appears well-developed and well-nourished.  HENT:  Right Ear: External ear normal.  Left Ear: External ear normal.  Nose: Nose normal.  Mouth/Throat: Oropharynx is clear and moist.  Eyes: Conjunctivae are normal.  Neck: Neck supple. No thyromegaly present.  Cardiovascular: Normal rate, regular rhythm, normal heart sounds and intact distal pulses.  Pulmonary/Chest: Effort normal and breath sounds normal. No stridor. No respiratory distress. She has no wheezes. She has no rales.  Lymphadenopathy:    She has no cervical adenopathy.  Neurological: She is alert and oriented to person, place, and time.          Assessment & Plan:  Treat the sinusitis with Augmentin. Order the 24 hour urine for heavy metal testing.  Gershon CraneStephen Harly Pipkins, MD

## 2018-04-13 ENCOUNTER — Encounter: Payer: Self-pay | Admitting: Family Medicine

## 2018-04-13 NOTE — Telephone Encounter (Signed)
Please reassure her that I spoke to the lab techs, and this will be run at the [facility]. The sample has been placed on ice, and a courier will pick it up later this afternoon

## 2018-04-17 ENCOUNTER — Encounter: Payer: Self-pay | Admitting: Family Medicine

## 2018-04-17 LAB — HEAVY METALS PROFILE, URINE
Arsenic, 24H Ur: 17 mcg/L (ref <=80)
Lead, 24 hr urine: <10 mcg/L (ref <80)

## 2018-04-19 ENCOUNTER — Encounter: Payer: Self-pay | Admitting: Family Medicine

## 2018-04-19 NOTE — Telephone Encounter (Signed)
I am sorry but once you file a Workers Comp case that automatically gives them authorization to all your medical records

## 2018-04-20 ENCOUNTER — Telehealth: Payer: Self-pay | Admitting: Family Medicine

## 2018-04-20 NOTE — Telephone Encounter (Signed)
Copied from CRM [phone]. Topic: General - Other >> Apr 20, 2018 10:26 AM Marylen PontoMcneil, Ja-Kwan wrote: Reason for CRM: Pt returned call to the office. Pt requests a call back. Cb# [phone]

## 2018-04-20 NOTE — Telephone Encounter (Signed)
Called and spoke with pt to go over lab results. Pt advised and voiced understanding.

## 2018-04-24 ENCOUNTER — Encounter: Payer: Self-pay | Admitting: Family Medicine

## 2018-04-25 NOTE — Telephone Encounter (Signed)
Call in #30 with 5 rf 

## 2018-04-26 ENCOUNTER — Other Ambulatory Visit: Payer: Self-pay | Admitting: Family Medicine

## 2018-04-26 ENCOUNTER — Encounter: Payer: Self-pay | Admitting: Gastroenterology

## 2018-04-26 ENCOUNTER — Other Ambulatory Visit: Payer: Self-pay

## 2018-04-26 MED ORDER — ALPRAZOLAM 1 MG PO TABS: 1.0000 mg | ORAL_TABLET | Freq: Every day | ORAL | 5 refills | Status: DC

## 2018-04-26 NOTE — Telephone Encounter (Signed)
Xanax   Call in #30 with 5 rf  rx has been called into pt's pharmacy.

## 2018-04-28 DIAGNOSIS — S6292XA Unspecified fracture of left wrist and hand, initial encounter for closed fracture: Secondary | ICD-10-CM | POA: Diagnosis not present

## 2018-04-28 DIAGNOSIS — Z743 Need for continuous supervision: Secondary | ICD-10-CM | POA: Diagnosis not present

## 2018-04-30 DIAGNOSIS — S52501A Unspecified fracture of the lower end of right radius, initial encounter for closed fracture: Secondary | ICD-10-CM | POA: Insufficient documentation

## 2018-05-01 ENCOUNTER — Other Ambulatory Visit: Payer: Self-pay

## 2018-05-01 ENCOUNTER — Encounter (HOSPITAL_COMMUNITY): Payer: Self-pay | Admitting: *Deleted

## 2018-05-01 NOTE — Progress Notes (Signed)
Spoke with pt for pre-op call. Pt denies cardiac history, HTN or Diabetes. 

## 2018-05-01 NOTE — H&P (Signed)
[name] is an 52 y.o. female.   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 She was [place] in the [place] Department in [place] with a short arm volar splint.  She flew back to [place] and was seen in our office for further treatment. She has had continued pain, swelling, and numbness in the arm. She was put into a sugar tong splint and the reason and rationale for surgery was discussed.  She is here today for surgery.  She denies chest pain, shortness of breath, nausea, vomiting, diarrhea, fever, or chills.  Past Medical History:  Diagnosis Date  . Medical history non-contributory   . Pelvic pain     Past Surgical History:  Procedure Laterality Date  . CERVICAL BIOPSY  W/ LOOP ELECTRODE EXCISION     NOV 2014  . CHOLECYSTECTOMY    . LAPAROSCOPIC SALPINGO OOPHERECTOMY Bilateral 03/16/2017   Procedure: LAPAROSCOPIC SALPINGO OOPHORECTOMY;  Surgeon: [name], Timothy P, MD;  Location: [facility];  Service: Gynecology;  Laterality: Bilateral;  . PELVIC LAPAROSCOPY      Family History  Problem Relation Age of Onset  . Hypertension Mother   . Breast cancer Other        GREAT AUNT   Social History:  reports that she has never smoked. She has never used smokeless tobacco. She reports that she drinks about 0.6 oz of alcohol per week. She reports that she does not use drugs.  Allergies: No Known Allergies  No medications prior to admission.    No results found for this or any previous visit (from the past 48 hour(s)). No results found.  ROS NO RECENT ILLNESSES OR HOSPITALIZATIONS  Last menstrual period 02/19/2009. Physical Exam  General Appearance:  Alert, cooperative, no distress, appears stated age  Head:  Normocephalic, without obvious abnormality, atraumatic  Eyes:  Pupils equal, conjunctiva/corneas clear,         Throat: Lips, mucosa, and tongue  normal; teeth and gums normal  Neck: No visible masses     Lungs:   respirations unlabored  Chest Wall:  No tenderness or deformity  Heart:  Regular rate and rhythm,  Abdomen:   Soft, non-tender,         Extremities: RUE: MODERATE SWELLING THROUGHOUT THE WRIST WITH ECCHYMOSIS. NO ERYTHEMA OR OPEN WOUNDS. SENSATION INTACT TO LIGHT TOUCH. CAPILLARY REFILL LESS THAN 2 SECONDS DISTALLY. ABLE TO WIGGLE FINGERS WITH MILD DIFFICULTY.    Pulses: 2+ and symmetric  Skin: Skin color, texture, turgor normal, no rashes or lesions     Neurologic: Normal    Assessment RIGHT DISPLACED INTRAARTICULAR DISTAL RADIUS FRACTURE  Plan RIGHT DISTAL RADIUS OPEN REDUCTION AND INTERNAL FIXATION WITH REPAIR AS INDICATED  WE ARE PLANNING SURGERY FOR YOUR UPPER EXTREMITY. THE RISKS AND BENEFITS OF SURGERY INCLUDE BUT NOT LIMITED TO BLEEDING INFECTION, DAMAGE TO NEARBY NERVES ARTERIES TENDONS, FAILURE OF SURGERY TO ACCOMPLISH ITS INTENDED GOALS, PERSISTENT SYMPTOMS AND NEED FOR FURTHER SURGICAL INTERVENTION. WITH THIS IN MIND WE WILL PROCEED. I HAVE DISCUSSED WITH THE PATIENT THE PRE AND POSTOPERATIVE REGIMEN AND THE DOS AND DON'TS. PT VOICED UNDERSTANDING AND INFORMED CONSENT SIGNED.  R/B/A DISCUSSED WITH PT IN OFFICE.  PT VOICED UNDERSTANDING OF PLAN CONSENT SIGNED DAY OF SURGERY PT SEEN AND EXAMINED PRIOR TO OPERATIVE PROCEDURE/DAY OF SURGERY SITE MARKED. QUESTIONS ANSWERED WILL GO HOME FOLLOWING SURGERY  [name] [name] 05/02/2018 [name] 05/01/2018, 4:51  PM

## 2018-05-02 ENCOUNTER — Encounter (HOSPITAL_COMMUNITY): Payer: Self-pay | Admitting: Certified Registered Nurse Anesthetist

## 2018-05-02 ENCOUNTER — Ambulatory Visit (HOSPITAL_COMMUNITY): Payer: No Typology Code available for payment source | Admitting: Anesthesiology

## 2018-05-02 ENCOUNTER — Other Ambulatory Visit: Payer: Self-pay

## 2018-05-02 ENCOUNTER — Ambulatory Visit (HOSPITAL_COMMUNITY)
Admission: RE | Admit: 2018-05-02 | Discharge: 2018-05-02 | Disposition: A | Discharge status: Home / Self Care | Payer: No Typology Code available for payment source | Source: Ambulatory Visit | Attending: Orthopedic Surgery | Admitting: Orthopedic Surgery

## 2018-05-02 ENCOUNTER — Encounter (HOSPITAL_COMMUNITY)
Admission: RE | Disposition: A | Discharge status: Home / Self Care | Payer: Self-pay | Source: Ambulatory Visit | Attending: Orthopedic Surgery

## 2018-05-02 ENCOUNTER — Ambulatory Visit: Admit: 2018-05-02 | Payer: 59 | Admitting: Orthopedic Surgery

## 2018-05-02 DIAGNOSIS — W19XXXA Unspecified fall, initial encounter: Secondary | ICD-10-CM | POA: Diagnosis not present

## 2018-05-02 DIAGNOSIS — J45909 Unspecified asthma, uncomplicated: Secondary | ICD-10-CM | POA: Diagnosis not present

## 2018-05-02 DIAGNOSIS — Y9289 Other specified places as the place of occurrence of the external cause: Secondary | ICD-10-CM | POA: Insufficient documentation

## 2018-05-02 DIAGNOSIS — S52571A Other intraarticular fracture of lower end of right radius, initial encounter for closed fracture: Secondary | ICD-10-CM | POA: Diagnosis not present

## 2018-05-02 DIAGNOSIS — Y990 Civilian activity done for income or pay: Secondary | ICD-10-CM | POA: Diagnosis not present

## 2018-05-02 HISTORY — PX: OPEN REDUCTION INTERNAL FIXATION (ORIF) DISTAL RADIAL FRACTURE: SHX5989

## 2018-05-02 HISTORY — DX: Anxiety disorder, unspecified: F41.9

## 2018-05-02 HISTORY — DX: Unspecified asthma, uncomplicated: J45.909

## 2018-05-02 LAB — CBC
HCT: 41.1 % (ref 36.0–46.0)
Hemoglobin: 13.1 g/dL (ref 12.0–15.0)
MCH: 29.4 pg (ref 26.0–34.0)
MCHC: 31.9 g/dL (ref 30.0–36.0)
MCV: 92.4 fL (ref 78.0–100.0)
PLATELETS: 294 10*3/uL (ref 150–400)
RBC: 4.45 MIL/uL (ref 3.87–5.11)
RDW: 13.5 % (ref 11.5–15.5)
WBC: 5.9 10*3/uL (ref 4.0–10.5)

## 2018-05-02 SURGERY — OPEN REDUCTION INTERNAL FIXATION (ORIF) DISTAL RADIUS FRACTURE
Anesthesia: General | Laterality: Right

## 2018-05-02 SURGERY — OPEN REDUCTION INTERNAL FIXATION (ORIF) DISTAL RADIUS FRACTURE
Anesthesia: Regional | Site: Arm Lower | Laterality: Right

## 2018-05-02 MED ORDER — PROPOFOL 10 MG/ML IV BOLUS
INTRAVENOUS | Status: DC | PRN
  Administered 2018-05-02: 20 mg via INTRAVENOUS
  Administered 2018-05-02: 40 mg via INTRAVENOUS

## 2018-05-02 MED ORDER — BUPIVACAINE-EPINEPHRINE (PF) 0.25% -1:200000 IJ SOLN
INTRAMUSCULAR | Status: AC
  Filled 2018-05-02: qty 30

## 2018-05-02 MED ORDER — CEFAZOLIN SODIUM-DEXTROSE 2-4 GM/100ML-% IV SOLN
2.0000 g | INTRAVENOUS | Status: AC
  Administered 2018-05-02: 2 g via INTRAVENOUS

## 2018-05-02 MED ORDER — ONDANSETRON HCL 4 MG/2ML IJ SOLN
INTRAMUSCULAR | Status: DC | PRN
  Administered 2018-05-02: 4 mg via INTRAVENOUS

## 2018-05-02 MED ORDER — PROMETHAZINE HCL 25 MG/ML IJ SOLN: 6.2500 mg | INTRAMUSCULAR | Status: DC | PRN

## 2018-05-02 MED ORDER — ACETAMINOPHEN 10 MG/ML IV SOLN: 1000.0000 mg | Freq: Once | INTRAVENOUS | Status: DC | PRN

## 2018-05-02 MED ORDER — ONDANSETRON HCL 4 MG/2ML IJ SOLN
INTRAMUSCULAR | Status: AC
  Filled 2018-05-02: qty 2

## 2018-05-02 MED ORDER — MIDAZOLAM HCL 2 MG/2ML IJ SOLN
INTRAMUSCULAR | Status: AC
  Administered 2018-05-02: 2 mg via INTRAVENOUS
  Filled 2018-05-02: qty 2

## 2018-05-02 MED ORDER — MIDAZOLAM HCL 2 MG/2ML IJ SOLN
2.0000 mg | Freq: Once | INTRAMUSCULAR | Status: AC
  Administered 2018-05-02: 2 mg via INTRAVENOUS

## 2018-05-02 MED ORDER — HYDROCODONE-ACETAMINOPHEN 7.5-325 MG PO TABS
1.0000 | ORAL_TABLET | Freq: Once | ORAL | Status: DC | PRN
Start: 2018-05-02 — End: 2018-05-02

## 2018-05-02 MED ORDER — CHLORHEXIDINE GLUCONATE 4 % EX LIQD: 60.0000 mL | Freq: Once | CUTANEOUS | Status: DC

## 2018-05-02 MED ORDER — MIDAZOLAM HCL 2 MG/2ML IJ SOLN: 1.0000 mg | Freq: Once | INTRAMUSCULAR | Status: DC

## 2018-05-02 MED ORDER — HYDROMORPHONE HCL 1 MG/ML IJ SOLN: 0.2500 mg | INTRAMUSCULAR | Status: DC | PRN

## 2018-05-02 MED ORDER — DEXAMETHASONE SODIUM PHOSPHATE 10 MG/ML IJ SOLN
INTRAMUSCULAR | Status: DC | PRN
  Administered 2018-05-02: 10 mg via INTRAVENOUS

## 2018-05-02 MED ORDER — FENTANYL CITRATE (PF) 100 MCG/2ML IJ SOLN
100.0000 ug | Freq: Once | INTRAMUSCULAR | Status: AC
  Administered 2018-05-02: 100 ug via INTRAVENOUS

## 2018-05-02 MED ORDER — FENTANYL CITRATE (PF) 250 MCG/5ML IJ SOLN
INTRAMUSCULAR | Status: AC
Start: 2018-05-02 — End: ?
  Filled 2018-05-02: qty 5

## 2018-05-02 MED ORDER — MEPERIDINE HCL 50 MG/ML IJ SOLN: 6.2500 mg | INTRAMUSCULAR | Status: DC | PRN

## 2018-05-02 MED ORDER — PROPOFOL 1000 MG/100ML IV EMUL
INTRAVENOUS | Status: AC
  Filled 2018-05-02: qty 100

## 2018-05-02 MED ORDER — LACTATED RINGERS IV SOLN
INTRAVENOUS | Status: DC
  Administered 2018-05-02: 17:00:00 via INTRAVENOUS

## 2018-05-02 MED ORDER — FENTANYL CITRATE (PF) 100 MCG/2ML IJ SOLN
INTRAMUSCULAR | Status: AC
  Administered 2018-05-02: 100 ug via INTRAVENOUS
  Filled 2018-05-02: qty 2

## 2018-05-02 MED ORDER — CEFAZOLIN SODIUM-DEXTROSE 2-4 GM/100ML-% IV SOLN
INTRAVENOUS | Status: AC
  Filled 2018-05-02: qty 100

## 2018-05-02 MED ORDER — FENTANYL CITRATE (PF) 100 MCG/2ML IJ SOLN: 50.0000 ug | Freq: Once | INTRAMUSCULAR | Status: DC

## 2018-05-02 MED ORDER — 0.9 % SODIUM CHLORIDE (POUR BTL) OPTIME
TOPICAL | Status: DC | PRN
  Administered 2018-05-02: 1000 mL

## 2018-05-02 MED ORDER — DEXAMETHASONE SODIUM PHOSPHATE 10 MG/ML IJ SOLN
INTRAMUSCULAR | Status: AC
  Filled 2018-05-02: qty 1

## 2018-05-02 MED ORDER — ROPIVACAINE HCL 5 MG/ML IJ SOLN
INTRAMUSCULAR | Status: DC | PRN
  Administered 2018-05-02: 30 mL via PERINEURAL

## 2018-05-02 MED ORDER — PROPOFOL 500 MG/50ML IV EMUL
INTRAVENOUS | Status: DC | PRN
  Administered 2018-05-02: 100 ug/kg/min via INTRAVENOUS

## 2018-05-02 MED ORDER — LIDOCAINE 2% (20 MG/ML) 5 ML SYRINGE
INTRAMUSCULAR | Status: AC
  Filled 2018-05-02: qty 5

## 2018-05-02 SURGICAL SUPPLY — 67 items
BANDAGE ACE 3X5.8 VEL STRL LF (GAUZE/BANDAGES/DRESSINGS) ×1 IMPLANT
BANDAGE ACE 4X5 VEL STRL LF (GAUZE/BANDAGES/DRESSINGS) ×2 IMPLANT
BIT DRILL 2.2 SS TIBIAL (BIT) ×1 IMPLANT
BLADE CLIPPER SURG (BLADE) IMPLANT
BNDG CMPR 9X4 STRL LF SNTH (GAUZE/BANDAGES/DRESSINGS) ×1
BNDG ESMARK 4X9 LF (GAUZE/BANDAGES/DRESSINGS) ×2 IMPLANT
BNDG GAUZE ELAST 4 BULKY (GAUZE/BANDAGES/DRESSINGS) ×2 IMPLANT
CANISTER SUCT 3000ML PPV (MISCELLANEOUS) ×2 IMPLANT
CORDS BIPOLAR (ELECTRODE) ×2 IMPLANT
COVER SURGICAL LIGHT HANDLE (MISCELLANEOUS) ×2 IMPLANT
CUFF TOURNIQUET SINGLE 18IN (TOURNIQUET CUFF) ×2 IMPLANT
CUFF TOURNIQUET SINGLE 24IN (TOURNIQUET CUFF) IMPLANT
DECANTER SPIKE VIAL GLASS SM (MISCELLANEOUS) ×2 IMPLANT
DRAPE OEC MINIVIEW 54X84 (DRAPES) ×2 IMPLANT
DRAPE SURG 17X11 SM STRL (DRAPES) ×2 IMPLANT
DRSG ADAPTIC 3X8 NADH LF (GAUZE/BANDAGES/DRESSINGS) ×2 IMPLANT
DRSG PAD ABDOMINAL 8X10 ST (GAUZE/BANDAGES/DRESSINGS) ×1 IMPLANT
GAUZE 4X4 16PLY RFD (DISPOSABLE) ×2 IMPLANT
GAUZE SPONGE 4X4 12PLY STRL (GAUZE/BANDAGES/DRESSINGS) ×2 IMPLANT
GAUZE XEROFORM 5X9 LF (GAUZE/BANDAGES/DRESSINGS) ×1 IMPLANT
GLOVE BIOGEL PI IND STRL 8.5 (GLOVE) ×1 IMPLANT
GLOVE BIOGEL PI INDICATOR 8.5 (GLOVE) ×1
GLOVE SURG ORTHO 8.0 STRL STRW (GLOVE) ×2 IMPLANT
GOWN STRL REUS W/ TWL LRG LVL3 (GOWN DISPOSABLE) ×1 IMPLANT
GOWN STRL REUS W/ TWL XL LVL3 (GOWN DISPOSABLE) ×1 IMPLANT
GOWN STRL REUS W/TWL LRG LVL3 (GOWN DISPOSABLE) ×2
GOWN STRL REUS W/TWL XL LVL3 (GOWN DISPOSABLE) ×2
K-WIRE 1.6 (WIRE) ×2
K-WIRE FX5X1.6XNS BN SS (WIRE) ×1
KIT BASIN OR (CUSTOM PROCEDURE TRAY) ×2 IMPLANT
KIT TURNOVER KIT B (KITS) ×2 IMPLANT
KWIRE FX5X1.6XNS BN SS (WIRE) IMPLANT
NDL HYPO 25X1 1.5 SAFETY (NEEDLE) ×1 IMPLANT
NEEDLE HYPO 25X1 1.5 SAFETY (NEEDLE) ×2 IMPLANT
NS IRRIG 1000ML POUR BTL (IV SOLUTION) ×2 IMPLANT
PACK ORTHO EXTREMITY (CUSTOM PROCEDURE TRAY) ×2 IMPLANT
PAD ARMBOARD 7.5X6 YLW CONV (MISCELLANEOUS) ×4 IMPLANT
PAD CAST 4YDX4 CTTN HI CHSV (CAST SUPPLIES) ×1 IMPLANT
PADDING CAST ABS 3INX4YD NS (CAST SUPPLIES) ×1
PADDING CAST ABS 4INX4YD NS (CAST SUPPLIES) ×1
PADDING CAST ABS COTTON 3X4 (CAST SUPPLIES) IMPLANT
PADDING CAST ABS COTTON 4X4 ST (CAST SUPPLIES) IMPLANT
PADDING CAST COTTON 4X4 STRL (CAST SUPPLIES)
PEG LOCKING SMOOTH 2.2X16 (Screw) ×1 IMPLANT
PEG LOCKING SMOOTH 2.2X18 (Peg) ×2 IMPLANT
PEG LOCKING SMOOTH 2.2X20 (Screw) ×3 IMPLANT
PEG LOCKING SMOOTH 2.2X22 (Screw) ×1 IMPLANT
PLATE DVR CROSSLOCK STD RT (Plate) ×1 IMPLANT
SCREW LOCK 14X2.7X 3 LD TPR (Screw) IMPLANT
SCREW LOCKING 2.7X14 (Screw) ×4 IMPLANT
SCREW LOCKING 2.7X15MM (Screw) ×3 IMPLANT
SOAP 2 % CHG 4 OZ (WOUND CARE) ×2 IMPLANT
SPLINT PLASTER CAST XFAST 5X30 (CAST SUPPLIES) IMPLANT
SPLINT PLASTER XFAST SET 5X30 (CAST SUPPLIES) ×1
SPONGE LAP 4X18 RFD (DISPOSABLE) ×2 IMPLANT
SUT PROLENE 4 0 PS 2 18 (SUTURE) IMPLANT
SUT VIC AB 2-0 CT1 27 (SUTURE) ×2
SUT VIC AB 2-0 CT1 TAPERPNT 27 (SUTURE) IMPLANT
SUT VIC AB 4-0 P-3 18X BRD (SUTURE) IMPLANT
SUT VIC AB 4-0 P3 18 (SUTURE) ×4
SUT VICRYL 4-0 PS2 18IN ABS (SUTURE) IMPLANT
SYR CONTROL 10ML LL (SYRINGE) IMPLANT
TOWEL OR 17X24 6PK STRL BLUE (TOWEL DISPOSABLE) ×2 IMPLANT
TOWEL OR 17X26 10 PK STRL BLUE (TOWEL DISPOSABLE) ×2 IMPLANT
TUBE CONNECTING 12X1/4 (SUCTIONS) ×2 IMPLANT
WATER STERILE IRR 1000ML POUR (IV SOLUTION) ×2 IMPLANT
YANKAUER SUCT BULB TIP NO VENT (SUCTIONS) ×1 IMPLANT

## 2018-05-02 NOTE — Anesthesia Postprocedure Evaluation (Signed)
Anesthesia Post Note  Patient: [name]  Procedure(s) Performed: OPEN REDUCTION INTERNAL FIXATION (ORIF) DISTAL RADIAL FRACTURE (Right Arm Lower)     Patient location during evaluation: PACU Anesthesia Type: Regional Level of consciousness: awake and alert Pain management: pain level controlled Vital Signs Assessment: post-procedure vital signs reviewed and stable Respiratory status: spontaneous breathing, nonlabored ventilation, respiratory function stable and patient connected to nasal cannula oxygen Cardiovascular status: stable and blood pressure returned to baseline Postop Assessment: no apparent nausea or vomiting Anesthetic complications: no    Last Vitals:  Vitals:   05/02/18 1815 05/02/18 1830  BP:  (!) 157/139  Pulse: (!) 58 75  Resp: 15 14  Temp:  36.6 C  SpO2: 100% 98%    Last Pain:  Vitals:   05/02/18 1830  TempSrc:   PainSc: 0-No pain                 [name]

## 2018-05-02 NOTE — Anesthesia Preprocedure Evaluation (Addendum)
Anesthesia Evaluation  Patient identified by MRN, date of birth, ID band Patient awake    Reviewed: Allergy & Precautions, NPO status , Patient's Chart, lab work & pertinent test results  Airway Mallampati: II  TM Distance: >3 FB Neck ROM: Full    Dental no notable dental hx. (+) Teeth Intact, Dental Advisory Given   Pulmonary asthma ,    Pulmonary exam normal breath sounds clear to auscultation       Cardiovascular Exercise Tolerance: Good negative cardio ROS Normal cardiovascular exam Rhythm:Regular Rate:Normal     Neuro/Psych negative neurological ROS     GI/Hepatic negative GI ROS, Neg liver ROS,   Endo/Other    Renal/GU negative Renal ROS     Musculoskeletal   Abdominal   Peds  Hematology   Anesthesia Other Findings   Reproductive/Obstetrics                             Lab Results  Component Value Date   WBC 4.1 03/15/2018   HGB 14.2 03/15/2018   HCT 41.9 03/15/2018   MCV 89.0 03/15/2018   PLT 290.0 03/15/2018    Anesthesia Physical Anesthesia Plan  ASA: II  Anesthesia Plan: Regional   Post-op Pain Management:    Induction:   PONV Risk Score and Plan: 4 or greater and Treatment may vary due to age or medical condition, Ondansetron and Dexamethasone  Airway Management Planned: Mask, Natural Airway and Nasal Cannula  Additional Equipment:   Intra-op Plan:   Post-operative Plan:   Informed Consent: I have reviewed the patients History and Physical, chart, labs and discussed the procedure including the risks, benefits and alternatives for the proposed anesthesia with the patient or authorized representative who has indicated his/her understanding and acceptance.   Dental advisory given  Plan Discussed with: CRNA  Anesthesia Plan Comments: (R supraclavicular nerve block)       Anesthesia Quick Evaluation

## 2018-05-02 NOTE — Op Note (Signed)
PREOPERATIVE DIAGNOSIS: Right wrist intra-articular distal radius fracture 3 more fragments  POSTOPERATIVE DIAGNOSIS: Same  ATTENDING SURGEON: Dr. Bradly BienenstockFred Elmond Love who was scrubbed and present for the entire procedure  ASSISTANT SURGEON: Tracy ModySamantha Barton PA-C was scrubbed and necessary for aid in reduction internal fixation closure and splinting  ANESTHESIA: Regional block with IV sedation  OPERATIVE PROCEDURE: #1: Open treatment of right wrist intra-articular distal radius fracture 3 or more fragments #2: Right wrist brachioradialis tendon release, tendon tenotomy #3: Radiographs 3 views right wrist  IMPLANTS: Biomet standard DVR cross lock  RADIOGRAPHIC INTERPRETATION: AP lateral oblique views of the wrist to show the volar plate fixation place in good position  SURGICAL INDICATIONS: Patient is a right-hand dominant female who sustained a closed intra-articular distal radius fracture. Patient seen and evaluated in the office and given the nature degree of displacement is recommended that she undergo the above procedure. Risks of surgery include but not limited to bleeding infection damage to nearby nerves arteries or tendons nonunion malunion hardware failure and need for further surgical intervention.  SURGICAL TECHNIQUE: patient is probably identified in the preoperative holding area marked for permanent marker made on the right wrist indicate the correct operative site. Patient brought back to the operating placed supine on anesthesia and table where the IV sedation was administered. Patient tolerated this well. A well-padded tourniquet was then placed on the right brachium and sealed with the appropriate drape. The right upper extremities and prepped and draped in normal sterile fashion. A timeout was called cracks I was identified and the procedure then begun. Attention was then turned to the right wrist. The limb was then elevated tourniquet insufflated. A longitudinal incision made directly  over the FCR sheath. Dissection carried down through the skin and subcutaneous tissues. The FPL was then identified and the pronator quadratus was then elevated in an L-shaped fashion. The radial column was then carefully exposed. Careful dissection was then carried around around the radial sided reduce the intra-articular fracture. Brachia radialis tendon release was then carried out to release and reduce the radial side. Open reduction was then performed of the intra-articular fracture 3 or more fragments. Open reduction was then performed with good near anatomical alignment. Following this the volar plate was then applied. His held distally with a K wire. Plate position was then confirmed using the mini C-arm. Following this the oblong screw hole was then placed proximally. Plate height was an adjusted. Following this distal fixation was then carried out with a combination of distal locking pegs. After distal fixation screw fixation was then carried out of the shaft shaft fixation combination of locking and nonlocking screws. The wound was then thoroughly irrigated. Final radiographs were then obtained. The pronator quadratus was then closed with 2-0 Vicryl suture. The subcutaneous tissues were then closed with 4-0 Vicryl. Skin was then closed with simple 4-0 Prolene. Adaptic dressing a sterile compressive bandage then applied. The patient tolerated the procedure well returned recovery room in good condition.    POSTOPERATIVE PLAN Patient be discharged to home. Seen back in the office in 2 weeks for wound check suture removal x-rays application a short arm cast place the therapy order begin at the four-week mark. Radiographs at each visit. Begin an outpatient therapy regimen at the four-week mark

## 2018-05-02 NOTE — Anesthesia Procedure Notes (Signed)
Procedure Name: MAC Date/Time: 05/02/2018 2:54 PM Performed by: Barrington Ellison, CRNA Pre-anesthesia Checklist: Patient identified, Emergency Drugs available, Suction available, Patient being monitored and Timeout performed Patient Re-evaluated:Patient Re-evaluated prior to induction Oxygen Delivery Method: Simple face mask

## 2018-05-02 NOTE — Discharge Instructions (Signed)
KEEP BANDAGE CLEAN AND DRY CALL OFFICE FOR F/U APPT [phone] IN 13 DAYS [facility] [phone] RX SENT TO HER PHARMACY KEEP HAND ELEVATED ABOVE HEART OK TO APPLY ICE TO OPERATIVE AREA CONTACT OFFICE IF ANY WORSENING PAIN OR CONCERNS.

## 2018-05-02 NOTE — Anesthesia Procedure Notes (Signed)
Anesthesia Regional Block: Supraclavicular block   Pre-Anesthetic Checklist: ,, timeout performed, Correct Patient, Correct Site, Correct Laterality, Correct Procedure, Correct Position, site marked, Risks and benefits discussed,  Surgical consent,  Pre-op evaluation,  At surgeon's request and post-op pain management  Laterality: Right  Prep: chloraprep       Needles:  Injection technique: Single-shot  Needle Type: Echogenic Needle     Needle Length: 5cm  Needle Gauge: 21     Additional Needles:   Procedures:,,,, ultrasound used (permanent image in chart),,,,  Narrative:  Start time: 05/02/2018 4:08 PM End time: 05/02/2018 4:18 PM Injection made incrementally with aspirations every 5 mL.  Performed by: Personally  Anesthesiologist: Trevor IhaHouser, Stephen A, MD

## 2018-05-02 NOTE — Transfer of Care (Signed)
Immediate Anesthesia Transfer of Care Note  Patient: [name]  Procedure(s) Performed: OPEN REDUCTION INTERNAL FIXATION (ORIF) DISTAL RADIAL FRACTURE (Right Arm Lower)  Patient Location: PACU  Anesthesia Type:MAC and Regional  Level of Consciousness: awake, alert  and oriented  Airway & Oxygen Therapy: Patient Spontanous Breathing  Post-op Assessment: Report given to RN  Post vital signs: Reviewed and stable  Last Vitals:  Vitals Value Taken Time  BP    Temp    Pulse 60 05/02/2018  5:56 PM  Resp 15 05/02/2018  5:56 PM  SpO2 96 % 05/02/2018  5:56 PM  Vitals shown include unvalidated device data.  Last Pain:  Vitals:   05/02/18 1630  TempSrc:   PainSc: 0-No pain      Patients Stated Pain Goal: 3 (66/06/00 4599)  Complications: No apparent anesthesia complications

## 2018-05-03 ENCOUNTER — Encounter (HOSPITAL_COMMUNITY): Payer: Self-pay | Admitting: Orthopedic Surgery

## 2018-05-04 ENCOUNTER — Telehealth: Payer: Self-pay | Admitting: Family Medicine

## 2018-05-04 NOTE — Telephone Encounter (Signed)
Copied from CRM [id number]. Topic: Inquiry >> May 04, 2018 10:08 AM Baldo DaubAlexander, Amber L wrote: Reason for CRM:  Pt states that Dr. Clent RidgesFry wanted her scheduled for a bone density test, however pt fell Saturday and broke her arm, it is in a cast.  Pt wants to know if the bone density test can be done now? Neysa BonitoChristy can be reached at [phone]

## 2018-05-04 NOTE — Telephone Encounter (Signed)
Yes she can still get the DEXA. They look at the lumbar spine and the hips

## 2018-05-04 NOTE — Telephone Encounter (Signed)
Please advise 

## 2018-05-07 NOTE — Telephone Encounter (Signed)
Called patient and discussed Dr. Claris CheFry's notes. She states that our office called her to schedule the DEXA scan. I informed patient that she has to call [facility] office to schedule her appointment. Patient stated she had to make another phone call and she would wait for a call back.

## 2018-05-08 NOTE — Telephone Encounter (Signed)
Called patient back and explained that she needed to call the [place] office. Patient expressed understanding.

## 2018-06-12 ENCOUNTER — Other Ambulatory Visit: Payer: 59

## 2018-06-20 DIAGNOSIS — J018 Other acute sinusitis: Secondary | ICD-10-CM | POA: Diagnosis not present

## 2018-06-21 ENCOUNTER — Encounter: Payer: 59 | Admitting: Gastroenterology

## 2018-06-27 ENCOUNTER — Ambulatory Visit (INDEPENDENT_AMBULATORY_CARE_PROVIDER_SITE_OTHER): Payer: 59 | Admitting: Mental Health

## 2018-06-27 DIAGNOSIS — F4323 Adjustment disorder with mixed anxiety and depressed mood: Secondary | ICD-10-CM | POA: Diagnosis not present

## 2018-06-27 NOTE — Progress Notes (Signed)
      [facility] Counselor/Therapist Progress Note   Patient ID: [name], MRN: [id number]  Date: 06/27/2018  Timespent: 50 minutes  Treatment Type: Individual  Subjective: Patient arrived on time for today's session.  She shared recent progress and life events.  She shared how she continues to cope with levels of anxiety most days.  She continues to cope with financial stress.  She stated that she is making some progress with healing and has followed all doctor recommendations.  She says she plans to follow up with her doctors referral to integrative care for further treatment.  Continue to provide support as she processed feelings related to family stressors, primarily related to the relationship with her parents.  Continue to work with patient from a cognitive behavioral framework.  Engaged in some problem solving with patient to improve coping.  Interventions:CBT, Solution Focused and Strength-based  Mental Status Exam:   Appearance:   Casual     Behavior:  Appropriate  Motor:  Normal  Speech/Language:   Normal Rate  Affect:  Full Range  Mood:  anxious  Thought process:  Coherent and Relevant  Thought content:    Logical  Perceptual disturbances:    Normal  Orientation:  Full (Time, Place, and Person)  Attention:  Good  Concentration:  good  Memory:  Immediate  Fund of knowledge:   Good  Insight:    Good  Judgment:   Good  Impulse Control:  good    Reported Symptoms: Some feelings of depression, anxiety most days, rumination,  Risk Assessment: Danger to Self:  No Self-injurious Behavior: No Danger to Others: No Duty to Warn:no Physical Aggression / Violence:No  Access to Firearms a concern: No  Gang Involvement:No   Diagnosis:   ICD-10-CM   1. Adjustment disorder with mixed anxiety and depressed mood F43.23      Plan:   1.  Patient to continue to engage in individual counseling 2-4 times a month or as needed. 2.  Patient to identify and apply  CBT, coping skills learned in session to decrease depression and anxiety symptoms. 3.  Patient to contact this office, go to the local ED or call 911 if a crisis or emergency develops between visits.  Waldron Session, [facility]

## 2018-07-04 ENCOUNTER — Ambulatory Visit (INDEPENDENT_AMBULATORY_CARE_PROVIDER_SITE_OTHER): Payer: 59 | Admitting: Mental Health

## 2018-07-04 DIAGNOSIS — F331 Major depressive disorder, recurrent, moderate: Secondary | ICD-10-CM | POA: Diagnosis not present

## 2018-07-04 NOTE — Progress Notes (Signed)
      [facility] Counselor/Therapist Progress Note   Patient ID: [name], MRN: [id number]  Date: 07/04/2018  Timespent: 50 minutes  Treatment Type: Individual  Subjective: Patient arrived on time for today's session in no distress.  She shared how she has continued to cope with her recovery related to her workplace injury.  She followed through with her appointment at her integrative care provider as she continues to take steps toward health and wellness.  She processed feelings related to some past relationships and identified her need to want to be around children.  She shared how she has had many fond memories of helping raise her ex-husband's children when they were married.  She shared how she has been able to follow through with her eligibility from her employer to have some financial assistance while on leave.  She stated that this has been helpful toward lowering her stress in this area.  Ways to continue to cope and care for herself were reviewed.  Patient was encouraged to realize how she can take steps toward focusing her thoughts and energy on life issues she can effectively change versus once that she cannot at this time.  Continued to work with patient from a cognitive behavioral framework.  Engaged in some problem solving with patient to improve coping.  Interventions:CBT, Solution Focused and Strength-based  Mental Status Exam:   Appearance:   Casual     Behavior:  Appropriate  Motor:  Normal  Speech/Language:   Normal Rate  Affect:  Full Range  Mood:  anxious  Thought process:  Coherent and Relevant  Thought content:    Logical  Perceptual disturbances:    Normal  Orientation:  Full (Time, Place, and Person)  Attention:  Good  Concentration:  good  Memory:  Immediate  Fund of knowledge:   Good  Insight:    Good  Judgment:   Good  Impulse Control:  good    Reported Symptoms: Some feelings of depression, anxiety most days, rumination  Risk  Assessment: Danger to Self:  No Self-injurious Behavior: No Danger to Others: No Duty to Warn:no Physical Aggression / Violence:No  Access to Firearms a concern: No  Gang Involvement:No   Diagnosis: No diagnosis found.   Plan:   1.  Patient to continue to engage in individual counseling 2-4 times a month or as needed. 2.  Patient to identify and apply CBT, coping skills learned in session to decrease depression and anxiety symptoms. 3.  Patient to contact this office, go to the local ED or call 911 if a crisis or emergency develops between visits.  Waldron Session, [facility]

## 2018-07-12 ENCOUNTER — Ambulatory Visit (INDEPENDENT_AMBULATORY_CARE_PROVIDER_SITE_OTHER): Payer: 59 | Admitting: Mental Health

## 2018-07-12 DIAGNOSIS — F331 Major depressive disorder, recurrent, moderate: Secondary | ICD-10-CM

## 2018-07-12 NOTE — Progress Notes (Signed)
             [facility] Counselor/Therapist Progress Note   Patient ID: [name], MRN: [id number]  Date: 07/12/2018  Timespent: 50 minutes  Treatment Type: Individual  Subjective: Patient arrived on time for today's session.  She shared progress and events since her last visit.  She shared how she continues to attend her doctor appointments related to her recovery from her fall.  She continues to have some stressors related to family issues and employment.  She remains out of work on medical leave to continue to heal and receive treatment.  She processed feelings related to her mother as their relationship can be strained at times.  She processed feelings also related to being unable to work and how she is identified her long work history and sense of purpose around being self-sufficient.  Provided support and reviewed while assisting patient in identifying's self strengths.  Encouraged her to continue between sessions.   Continued to work with patient from a cognitive behavioral framework.  Engaged in some problem solving with patient to improve coping.  Interventions:CBT, Solution Focused and Strength-based  Mental Status Exam:   Appearance:   Casual     Behavior:  Appropriate  Motor:  Normal  Speech/Language:   Normal Rate  Affect:  Full Range  Mood:  anxious  Thought process:  Coherent and Relevant  Thought content:    Logical  Perceptual disturbances:    Normal  Orientation:  Full (Time, Place, and Person)  Attention:  Good  Concentration:  good  Memory:  Immediate  Fund of knowledge:   Good  Insight:    Good  Judgment:   Good  Impulse Control:  good    Reported Symptoms: Some feelings of depression, anxiety most days, rumination  Risk Assessment: Danger to Self:  No Self-injurious Behavior: No Danger to Others: No Duty to Warn:no Physical Aggression / Violence:No  Access to Firearms a concern: No  Gang Involvement:No   Diagnosis:   ICD-10-CM   1.  Major depressive disorder, recurrent episode, moderate (HCC) F33.1      Plan:   1.  Patient to continue to engage in individual counseling 2-4 times a month or as needed. 2.  Patient to identify and apply CBT, coping skills learned in session to decrease depression and anxiety symptoms. 3.  Patient to contact this office, go to the local ED or call 911 if a crisis or emergency develops between visits.  Waldron Session, [facility]

## 2018-07-18 ENCOUNTER — Ambulatory Visit (INDEPENDENT_AMBULATORY_CARE_PROVIDER_SITE_OTHER): Payer: 59 | Admitting: Mental Health

## 2018-07-18 DIAGNOSIS — F331 Major depressive disorder, recurrent, moderate: Secondary | ICD-10-CM

## 2018-07-18 NOTE — Progress Notes (Signed)
             [facility] Counselor/Therapist Progress Note   Patient ID: [name], MRN: [id number]  Date: 07/18/2018  Timespent: 50 minutes  Treatment Type: Individual  Subjective: Patient arrived on time for today's session.  She shared progress and events since her last visit.  She stated that she continues to attempt to take steps toward recovery from her wrist injury by attending doctor appointments and come continuing her physical therapy exercises.  She stated that she continues to cope with uncertainty about her future related to her employment.  She stated that she has applied for light duty return to work but awaits to hear a return date.  She continues to cope with a level of financial stress and cited how her anxiety has been increasing.  Continued to work with patient from a cognitive behavioral framework.  Assisted her in grounding her thoughts and what she does now which is she has a strong work history with the company and touching base with her supervisor as needed are useful efforts on her part to alleviate some of her anxiety.    Interventions:CBT, Solution Focused and Strength-based  Mental Status Exam:   Appearance:   Casual     Behavior:  Appropriate  Motor:  Normal  Speech/Language:   Normal Rate  Affect:  Full Range  Mood:  anxious  Thought process:  Coherent and Relevant  Thought content:    Logical  Perceptual disturbances:    Normal  Orientation:  Full (Time, Place, and Person)  Attention:  Good  Concentration:  good  Memory:  Immediate  Fund of knowledge:   Good  Insight:    Good  Judgment:   Good  Impulse Control:  good    Reported Symptoms: Some feelings of depression, anxiety most days, rumination  Risk Assessment: Danger to Self:  No Self-injurious Behavior: No Danger to Others: No Duty to Warn:no Physical Aggression / Violence:No  Access to Firearms a concern: No  Gang Involvement:No   Diagnosis:   ICD-10-CM   1. Major  depressive disorder, recurrent episode, moderate (HCC) F33.1      Plan:   1.  Patient to continue to engage in individual counseling 2-4 times a month or as needed. 2.  Patient to identify and apply CBT, coping skills learned in session to decrease depression and anxiety symptoms. 3.  Patient to contact this office, go to the local ED or call 911 if a crisis or emergency develops between visits.  Waldron Session, [facility]

## 2018-07-24 ENCOUNTER — Ambulatory Visit (INDEPENDENT_AMBULATORY_CARE_PROVIDER_SITE_OTHER): Payer: 59 | Admitting: Mental Health

## 2018-07-24 DIAGNOSIS — F331 Major depressive disorder, recurrent, moderate: Secondary | ICD-10-CM

## 2018-07-24 NOTE — Progress Notes (Signed)
             [facility] Counselor/Therapist Progress Note   Patient ID: [name], MRN: [id number]  Date: 07/26/2018  Timespent: 50 minutes  Treatment Type: Individual  Subjective: Patient arrived on time for today's session.  Discussed progress and events since last visit.  She shared how she continues to go to physical therapy and cope with continued pain levels.  She shared more history related to her past marital relationship and how it affected her.  He shared details related to the marital relationship as well as being a stepmother.  Patient was able to identify where her current needs are not to be in a relationship as she knows at some point but currently she is more focused on her current medical situation as well as other subsequent stressors.  We reviewed coping.  Continue to work with patient from a cognitive behavioral framework.  Assisted her in identifying daily strengths and positive steps she is taking towards self-care and growth. Interventions:CBT, Solution Focused and Strength-based  Mental Status Exam:   Appearance:   Casual     Behavior:  Appropriate  Motor:  Normal  Speech/Language:   Normal Rate  Affect:  Full Range  Mood:  anxious  Thought process:  Coherent and Relevant  Thought content:    Logical  Perceptual disturbances:    Normal  Orientation:  Full (Time, Place, and Person)  Attention:  Good  Concentration:  good  Memory:  Immediate  Fund of knowledge:   Good  Insight:    Good  Judgment:   Good  Impulse Control:  good    Reported Symptoms: Some feelings of depression, anxiety most days, rumination  Risk Assessment: Danger to Self:  No Self-injurious Behavior: No Danger to Others: No Duty to Warn:no Physical Aggression / Violence:No  Access to Firearms a concern: No  Gang Involvement:No   Diagnosis:   ICD-10-CM   1. Major depressive disorder, recurrent episode, moderate (HCC) F33.1      Plan:   1.  Patient to continue to  engage in individual counseling 2-4 times a month or as needed. 2.  Patient to identify and apply CBT, coping skills learned in session to decrease depression and anxiety symptoms. 3.  Patient to contact this office, go to the local ED or call 911 if a crisis or emergency develops between visits.  Waldron Session, [facility]

## 2018-07-25 ENCOUNTER — Encounter: Payer: 59 | Admitting: Gastroenterology

## 2018-07-31 ENCOUNTER — Ambulatory Visit: Payer: Self-pay | Admitting: Family Medicine

## 2018-08-01 ENCOUNTER — Ambulatory Visit (INDEPENDENT_AMBULATORY_CARE_PROVIDER_SITE_OTHER): Payer: 59 | Admitting: Mental Health

## 2018-08-01 DIAGNOSIS — F419 Anxiety disorder, unspecified: Secondary | ICD-10-CM | POA: Diagnosis not present

## 2018-08-01 NOTE — Progress Notes (Signed)
         [facility] Counselor/Therapist Progress Note   Patient ID: [name], MRN: [id number]  Date: 08/01/2018  Timespent: 50 minutes  Treatment Type: Individual  Subjective: Patient arrived on time for today's session.  Discussed progress and events since last visit.  She shared how she continues to care for her medical needs, attending appointments.  She shared some family related stress related to her relationship with her mother.  She continues to have support from her brother as they remain close.  She shared her plans for Thanksgiving with family and some expectations.  She continues to have significant financial stress as she continues also to await hearing back from her employer about 2 possibly return to work for [employer] duty.  She continues to not hear back from them but tries to manage her anxiety utilizing coping skills we have identified that have been effective.  Continue to provide support, encouragement working with patient from a strength-based approach.  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  She was agreeable and continuing to identify adaptive, idealistic thoughts to better manage her anxiety.   Interventions:CBT, Solution Focused and Strength-based  Mental Status Exam:   Appearance:   Casual     Behavior:  Appropriate  Motor:  Normal  Speech/Language:   Normal Rate  Affect:  Full Range  Mood:  anxious  Thought process:  Coherent and Relevant  Thought content:    Logical  Perceptual disturbances:    Normal  Orientation:  Full (Time, Place, and Person)  Attention:  Good  Concentration:  good  Memory:  Immediate  Fund of knowledge:   Good  Insight:    Good  Judgment:   Good  Impulse Control:  good    Reported Symptoms: Some feelings of depression, anxiety most days, rumination  Risk Assessment: Danger to Self:  No Self-injurious Behavior: No Danger to Others:  No Duty to Warn:no Physical Aggression / Violence:No  Access to Firearms a concern: No  Gang Involvement:No   Diagnosis:   ICD-10-CM   1. Anxiety F41.9      Plan:   1.  Patient to continue to engage in individual counseling 2-4 times a month or as needed. 2.  Patient to identify and apply CBT, coping skills learned in session to decrease depression and anxiety symptoms. 3.  Patient to contact this office, go to the local ED or call 911 if a crisis or emergency develops between visits.  Waldron Session, [facility]

## 2018-08-08 ENCOUNTER — Ambulatory Visit: Payer: 59 | Admitting: Mental Health

## 2018-08-14 ENCOUNTER — Ambulatory Visit (INDEPENDENT_AMBULATORY_CARE_PROVIDER_SITE_OTHER): Payer: 59 | Admitting: Mental Health

## 2018-08-14 DIAGNOSIS — F331 Major depressive disorder, recurrent, moderate: Secondary | ICD-10-CM | POA: Diagnosis not present

## 2018-08-14 DIAGNOSIS — F4323 Adjustment disorder with mixed anxiety and depressed mood: Secondary | ICD-10-CM

## 2018-08-14 NOTE — Progress Notes (Signed)
         [facility] Counselor/Therapist Progress Note   Patient ID: [name], MRN: [id number]  Date: 08/14/2018  Timespent: 53 minutes  Treatment Type: Individual  Mental Status Exam:   Appearance:   Casual     Behavior:  Appropriate  Motor:  Normal  Speech/Language:   Normal Rate  Affect:  Full Range  Mood:  anxious  Thought process:  Coherent and Relevant  Thought content:    Logical  Perceptual disturbances:    Normal  Orientation:  Full (Time, Place, and Person)  Attention:  Good  Concentration:  good  Memory:  Immediate  Fund of knowledge:   Good  Insight:    Good  Judgment:   Good  Impulse Control:  good    Reported Symptoms: Some feelings of depression, anxiety most days, rumination  Risk Assessment: Danger to Self:  No Self-injurious Behavior: No Danger to Others: No Duty to Warn:no Physical Aggression / Violence:No  Access to Firearms a concern: No  Gang Involvement:No   Patient / guardian was educated about steps to take if suicide or homicide risk level increases between visits. While future psychiatric events cannot be accurately predicted, the patient does not currently require acute inpatient psychiatric care and does not currently meet [facility] involuntary commitment criteria.  Subjective: Patient arrived on time for today's session.  Discussed progress and events since last visit.  She stated that she returned to work for her [employer], having to commute to [place] daily.  She stated that she lasted 4 days on the job and shared experiences.  She stated that her mood began to shift, felt more depressed.  She stated that on a flight returning home she experienced a rough landing and it aggravated her back, to the point where she experienced increased pain and is following up with her doctor later today.  She stated that she may have to have injections and/or possibly surgery.  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  Encouraged her to continue to reach out to professional supports related to her employment, to review options and advocate for her needs at this time.  She says she has been able to communicate with employer.  She continues to express uncertainty about her future and how this is affected her emotionally.  Interventions:CBT, Solution Focused and Strength-based  Diagnosis:   ICD-10-CM   1. Major depressive disorder, recurrent episode, moderate (HCC) F33.1   2. Adjustment disorder with mixed anxiety and depressed mood F43.23      Plan:   1.  Patient to continue to engage in individual counseling 2-4 times a month or as needed. 2.  Patient to identify and apply CBT, coping skills learned in session to decrease depression and anxiety symptoms. 3.  Patient to contact this office, go to the local ED or call 911 if a crisis or emergency develops between visits.  Waldron Sessionhristopher Valdemar Mcclenahan, [facility]

## 2018-08-22 ENCOUNTER — Ambulatory Visit: Payer: 59 | Admitting: Mental Health

## 2018-08-29 ENCOUNTER — Ambulatory Visit (INDEPENDENT_AMBULATORY_CARE_PROVIDER_SITE_OTHER): Payer: 59 | Admitting: Mental Health

## 2018-08-29 DIAGNOSIS — F331 Major depressive disorder, recurrent, moderate: Secondary | ICD-10-CM

## 2018-08-29 NOTE — Progress Notes (Signed)
         [facility] Counselor/Therapist Progress Note   Patient ID: [name], MRN: [id number]  Date: 08/29/2018  Timespent: 55 minutes  Treatment Type: Individual  Mental Status Exam:   Appearance:   Casual     Behavior:  Appropriate  Motor:  Normal  Speech/Language:   Normal Rate  Affect:  Full Range  Mood:  anxious  Thought process:  Coherent and Relevant  Thought content:    Logical  Perceptual disturbances:    Normal  Orientation:  Full (Time, Place, and Person)  Attention:  Good  Concentration:  good  Memory:  Immediate  Fund of knowledge:   Good  Insight:    Good  Judgment:   Good  Impulse Control:  good    Reported Symptoms: Some feelings of depression, anxiety most days, rumination  Risk Assessment: Danger to Self:  No Self-injurious Behavior: No Danger to Others: No Duty to Warn:no Physical Aggression / Violence:No  Access to Firearms a concern: No  Gang Involvement:No   Patient / guardian was educated about steps to take if suicide or homicide risk level increases between visits. While future psychiatric events cannot be accurately predicted, the patient does not currently require acute inpatient psychiatric care and does not currently meet [facility] involuntary commitment criteria.  Subjective: Patient arrived on time for today's session.  Discussed progress and events since last visit.  Patient shared experiences, how she had work experience in [place] with her employer temporarily.  She stated that there were some struggles related to this job.  She continues to heal from her fall and attend all doctor appointments as recommended.  She continues to express anxiety related to her life changes and uncertainty about her job future as she waits for some correspondence from her employer about light duty work.  Provided support as she processed feelings and continue to assist her in identifying ways to cope and care for  herself.  Interventions:CBT, Solution Focused and Strength-based  Diagnosis:   ICD-10-CM   1. Major depressive disorder, recurrent episode, moderate (HCC) F33.1      Plan:   1.  Patient to continue to engage in individual counseling 2-4 times a month or as needed. 2.  Patient to identify and apply CBT, coping skills learned in session to decrease depression and anxiety symptoms. 3.  Patient to contact this office, go to the local ED or call 911 if a crisis or emergency develops between visits.  Waldron Sessionhristopher Kaled Allende, [facility]

## 2018-09-05 ENCOUNTER — Ambulatory Visit (INDEPENDENT_AMBULATORY_CARE_PROVIDER_SITE_OTHER): Payer: 59 | Admitting: Mental Health

## 2018-09-05 DIAGNOSIS — F331 Major depressive disorder, recurrent, moderate: Secondary | ICD-10-CM

## 2018-09-05 NOTE — Progress Notes (Signed)
         [facility] Counselor/Therapist Progress Note   Patient ID: [name], MRN: [id number]  Date: 09/05/2018  Timespent: 55 minutes  Treatment Type: Individual  Mental Status Exam:   Appearance:   Casual     Behavior:  Appropriate  Motor:  Normal  Speech/Language:   Normal Rate  Affect:  Full Range  Mood:  anxious  Thought process:  Coherent and Relevant  Thought content:    Logical  Perceptual disturbances:    Normal  Orientation:  Full (Time, Place, and Person)  Attention:  Good  Concentration:  good  Memory:  Immediate  Fund of knowledge:   Good  Insight:    Good  Judgment:   Good  Impulse Control:  good    Reported Symptoms: Some feelings of depression, anxiety most days, rumination  Risk Assessment: Danger to Self:  No Self-injurious Behavior: No Danger to Others: No Duty to Warn:no Physical Aggression / Violence:No  Access to Firearms a concern: No  Gang Involvement:No   Patient / guardian was educated about steps to take if suicide or homicide risk level increases between visits. While future psychiatric events cannot be accurately predicted, the patient does not currently require acute inpatient psychiatric care and does not currently meet [facility] involuntary commitment criteria.  Subjective: Patient arrived on time for today's session.  Discussed progress.  She shared how she continues to cope recovering from her fall but feels she is making progress.  She stated that she continues to plan and try to make decisions for her future as she continues to have a level of financial stress.  Provided support as she processed feelings related.  Explored coping with patient.  She continues to utilize some family support, namely her brother.  Provided support and encouragement as she continued to share short term goals in an effort to relieve some stress.  Interventions:CBT, Solution Focused and Strength-based  Diagnosis:   ICD-10-CM   1. Major  depressive disorder, recurrent episode, moderate (HCC) F33.1      Plan:   1.  Patient to continue to engage in individual counseling 2-4 times a month or as needed. 2.  Patient to identify and apply CBT, coping skills learned in session to decrease depression and anxiety symptoms. 3.  Patient to contact this office, go to the local ED or call 911 if a crisis or emergency develops between visits.  Waldron Sessionhristopher Qualyn Oyervides, [facility]

## 2018-09-14 ENCOUNTER — Ambulatory Visit (INDEPENDENT_AMBULATORY_CARE_PROVIDER_SITE_OTHER): Payer: 59 | Admitting: Mental Health

## 2018-09-14 ENCOUNTER — Encounter

## 2018-09-14 DIAGNOSIS — F331 Major depressive disorder, recurrent, moderate: Secondary | ICD-10-CM | POA: Diagnosis not present

## 2018-09-20 DIAGNOSIS — N390 Urinary tract infection, site not specified: Secondary | ICD-10-CM | POA: Diagnosis not present

## 2018-09-21 ENCOUNTER — Ambulatory Visit (INDEPENDENT_AMBULATORY_CARE_PROVIDER_SITE_OTHER): Payer: 59 | Admitting: Family Medicine

## 2018-09-21 ENCOUNTER — Encounter: Payer: Self-pay | Admitting: Family Medicine

## 2018-09-21 VITALS — BP 112/68 | HR 88 | Temp 98.7°F | Wt 125.2 lb

## 2018-09-21 DIAGNOSIS — J3089 Other allergic rhinitis: Secondary | ICD-10-CM

## 2018-09-21 DIAGNOSIS — J4520 Mild intermittent asthma, uncomplicated: Secondary | ICD-10-CM | POA: Diagnosis not present

## 2018-09-21 DIAGNOSIS — B009 Herpesviral infection, unspecified: Secondary | ICD-10-CM | POA: Diagnosis not present

## 2018-09-21 DIAGNOSIS — J019 Acute sinusitis, unspecified: Secondary | ICD-10-CM | POA: Diagnosis not present

## 2018-09-21 DIAGNOSIS — G43809 Other migraine, not intractable, without status migrainosus: Secondary | ICD-10-CM

## 2018-09-21 MED ORDER — AMOXICILLIN-POT CLAVULANATE 875-125 MG PO TABS: 1.0000 | ORAL_TABLET | Freq: Two times a day (BID) | ORAL | 0 refills | Status: DC

## 2018-09-21 MED ORDER — RIZATRIPTAN BENZOATE 10 MG PO TBDP: 10.0000 mg | ORAL_TABLET | ORAL | 3 refills | Status: DC | PRN

## 2018-09-21 MED ORDER — MONTELUKAST SODIUM 10 MG PO TABS: 10.0000 mg | ORAL_TABLET | Freq: Every day | ORAL | 3 refills | Status: DC

## 2018-09-21 MED ORDER — ALBUTEROL SULFATE HFA 108 (90 BASE) MCG/ACT IN AERS
2.0000 | INHALATION_SPRAY | RESPIRATORY_TRACT | 5 refills | Status: DC | PRN
Start: 2018-09-21 — End: 2018-11-12

## 2018-09-21 MED ORDER — FLUTICASONE PROPIONATE (INHAL) 100 MCG/BLIST IN AEPB: INHALATION_SPRAY | RESPIRATORY_TRACT | 5 refills | Status: DC

## 2018-09-21 MED ORDER — ALPRAZOLAM 1 MG PO TABS: 1.0000 mg | ORAL_TABLET | Freq: Every day | ORAL | 5 refills | Status: DC

## 2018-09-21 MED ORDER — VALACYCLOVIR HCL 500 MG PO TABS: 500.0000 mg | ORAL_TABLET | Freq: Two times a day (BID) | ORAL | 3 refills | Status: DC

## 2018-09-21 NOTE — Progress Notes (Signed)
   Subjective:    Patient ID: [name], female    DOB: 07/06/1966, 52 y.o.   MRN: [id number]  HPI Here for one week of sinus congestion, PND, and a dry cough. Using Mucinex.    Review of Systems  Constitutional: Negative.   HENT: Positive for congestion, postnasal drip, sinus pressure and sinus pain. Negative for sore throat.   Eyes: Negative.   Respiratory: Positive for cough.        Objective:   Physical Exam Constitutional:      Appearance: Normal appearance.  HENT:     Right Ear: Tympanic membrane and ear canal normal.     Left Ear: Tympanic membrane and ear canal normal.     Nose: Nose normal.     Mouth/Throat:     Pharynx: Oropharynx is clear.  Eyes:     Conjunctiva/sclera: Conjunctivae normal.  Neck:     Comments: Tender shotty AC nodes  Pulmonary:     Effort: Pulmonary effort is normal.     Breath sounds: Normal breath sounds.  Neurological:     Mental Status: She is alert.           Assessment & Plan:  Sinusitis, treat with Augmentin. Gershon CraneStephen Fry, MD

## 2018-09-24 ENCOUNTER — Ambulatory Visit (INDEPENDENT_AMBULATORY_CARE_PROVIDER_SITE_OTHER): Payer: 59 | Admitting: Mental Health

## 2018-09-24 DIAGNOSIS — F331 Major depressive disorder, recurrent, moderate: Secondary | ICD-10-CM | POA: Diagnosis not present

## 2018-10-02 DIAGNOSIS — H43312 Vitreous membranes and strands, left eye: Secondary | ICD-10-CM | POA: Diagnosis not present

## 2018-10-03 NOTE — Progress Notes (Signed)
         [facility] Counselor/Therapist Progress Note   Patient ID: [name], MRN: [id number]  Date: 10/03/2018  Timespent: 55 minutes  Treatment Type: Individual  Mental Status Exam:   Appearance:   Casual     Behavior:  Appropriate  Motor:  Normal  Speech/Language:   Normal Rate  Affect:  Full Range  Mood:  anxious  Thought process:  Coherent and Relevant  Thought content:    Logical  Perceptual disturbances:    Normal  Orientation:  Full (Time, Place, and Person)  Attention:  Good  Concentration:  good  Memory:  Immediate  Fund of knowledge:   Good  Insight:    Good  Judgment:   Good  Impulse Control:  good    Reported Symptoms: Some feelings of depression, anxiety most days, rumination  Risk Assessment: Danger to Self:  No Self-injurious Behavior: No Danger to Others: No Duty to Warn:no Physical Aggression / Violence:No  Access to Firearms a concern: No  Gang Involvement:No   Patient / guardian was educated about steps to take if suicide or homicide risk level increases between visits. While future psychiatric events cannot be accurately predicted, the patient does not currently require acute inpatient psychiatric care and does not currently meet [facility] involuntary commitment criteria.  Subjective: Patient arrived on time for today's session.  Discussed progress.  She shared how she has struggled with her physical health and financial stress.  She stated that she is considering looking for other employment, other life options.  She is at a crossroads with making decisions.  Encouraged her to give herself time and making these decisions.  Allow her time to process thoughts and feelings related to these potential life decisions.  She shared some continued feelings related to her past, namely when she was married and stepmother to her to step daughters.  Interventions:CBT, Solution Focused and Strength-based  Diagnosis:   ICD-10-CM   1. Major  depressive disorder, recurrent episode, moderate (HCC) F33.1      Plan:   1.  Patient to continue to engage in individual counseling 2-4 times a month or as needed. 2.  Patient to identify and apply CBT, coping skills learned in session to decrease depression and anxiety symptoms. 3.  Patient to contact this office, go to the local ED or call 911 if a crisis or emergency develops between visits.  Waldron Session, [facility]

## 2018-10-05 ENCOUNTER — Ambulatory Visit: Payer: 59 | Admitting: Mental Health

## 2018-10-08 NOTE — Progress Notes (Signed)
         [facility] Counselor/Therapist Progress Note   Patient ID: [name], MRN: [id number]  Date: 10/08/2018  Timespent: 54 minutes  Treatment Type: Individual  Mental Status Exam:   Appearance:   Casual     Behavior:  Appropriate  Motor:  Normal  Speech/Language:   Normal Rate  Affect:  Full Range  Mood:  anxious  Thought process:  Coherent and Relevant  Thought content:    Logical  Perceptual disturbances:    Normal  Orientation:  Full (Time, Place, and Person)  Attention:  Good  Concentration:  good  Memory:  Immediate  Fund of knowledge:   Good  Insight:    Good  Judgment:   Good  Impulse Control:  good    Reported Symptoms: Some feelings of depression, anxiety most days, rumination, fatigue, anhedonia, some sleep disturbances  Risk Assessment: Danger to Self:  No Self-injurious Behavior: No Danger to Others: No Duty to Warn:no Physical Aggression / Violence:No  Access to Firearms a concern: No  Gang Involvement:No   Patient / guardian was educated about steps to take if suicide or homicide risk level increases between visits. While future psychiatric events cannot be accurately predicted, the patient does not currently require acute inpatient psychiatric care and does not currently meet [facility] involuntary commitment criteria.  Subjective: Patient arrived on time for today's session.  Discussed progress and events since last visit.  She processed thoughts and feelings related to some history with her ex-husband and stepdaughters.  She stated they are in adults now.  She shared how they had relationship strain toward the end of the relationship.  She expressed some concern about her how some of her extended family may have to deal with some manipulative interactions of 1 of the adult daughters.  Ways to cope with anxiety and rumination were discussed.  Reviewed thought stopping and other cognitive behavioral strategies.  Support was provided  continuing to work with patient from a solution focused strength-based approach..   Interventions:CBT, Solution Focused and Strength-based  Diagnosis:   ICD-10-CM   1. Major depressive disorder, recurrent episode, moderate (HCC) F33.1      Plan:   1.  Patient to continue to engage in individual counseling 2-4 times a month or as needed. 2.  Patient to identify and apply CBT, coping skills learned in session to decrease depression and anxiety symptoms. 3.  Patient to contact this office, go to the local ED or call 911 if a crisis or emergency develops between visits.  [name], [facility]

## 2018-10-12 DIAGNOSIS — M5416 Radiculopathy, lumbar region: Secondary | ICD-10-CM | POA: Insufficient documentation

## 2018-10-22 ENCOUNTER — Telehealth: Payer: Self-pay | Admitting: Family Medicine

## 2018-10-22 ENCOUNTER — Encounter: Payer: Self-pay | Admitting: Family Medicine

## 2018-10-22 NOTE — Telephone Encounter (Signed)
Dr. Clent Ridges please advise if we need to schedule an OV for her.  Thanks

## 2018-10-22 NOTE — Telephone Encounter (Signed)
Surgical Clearance to be filled out from Dr Shon Baton at [facility].  Placed in Dr's folder.  Fax form to [phone], Attn: Cordelia Pen

## 2018-10-24 NOTE — Telephone Encounter (Signed)
Called and spoke with pt and she is aware that forms are being faxed now.  She will come by this afternoon to pick up a hard copy as well.

## 2018-10-24 NOTE — Telephone Encounter (Signed)
The form is ready  

## 2018-11-01 ENCOUNTER — Other Ambulatory Visit: Payer: Self-pay | Admitting: *Deleted

## 2018-11-06 ENCOUNTER — Ambulatory Visit: Payer: Self-pay | Admitting: Orthopedic Surgery

## 2018-11-07 ENCOUNTER — Ambulatory Visit (INDEPENDENT_AMBULATORY_CARE_PROVIDER_SITE_OTHER): Payer: 59 | Admitting: Mental Health

## 2018-11-07 DIAGNOSIS — F331 Major depressive disorder, recurrent, moderate: Secondary | ICD-10-CM | POA: Diagnosis not present

## 2018-11-11 ENCOUNTER — Other Ambulatory Visit: Payer: Self-pay | Admitting: Family Medicine

## 2018-11-14 ENCOUNTER — Ambulatory Visit: Payer: 59 | Admitting: Mental Health

## 2018-11-21 ENCOUNTER — Ambulatory Visit (INDEPENDENT_AMBULATORY_CARE_PROVIDER_SITE_OTHER): Payer: 59 | Admitting: Mental Health

## 2018-11-21 DIAGNOSIS — F331 Major depressive disorder, recurrent, moderate: Secondary | ICD-10-CM

## 2018-11-21 NOTE — Progress Notes (Signed)
         [facility] Counselor/Therapist Progress Note   Patient ID: [name], MRN: [id number]  Date: 11/21/2018  Timespent: 54 minutes  Treatment Type: Individual  Mental Status Exam:   Appearance:   Casual     Behavior:  Appropriate  Motor:  Normal  Speech/Language:   Normal Rate  Affect:  Full Range  Mood:  anxious  Thought process:  Coherent and Relevant  Thought content:    Logical  Perceptual disturbances:    Normal  Orientation:  Full (Time, Place, and Person)  Attention:  Good  Concentration:  good  Memory:  Immediate  Fund of knowledge:   Good  Insight:    Good  Judgment:   Good  Impulse Control:  good    Reported Symptoms: Some feelings of depression, anxiety most days, rumination, fatigue, anhedonia, some sleep disturbances  Risk Assessment: Danger to Self:  No Self-injurious Behavior: No Danger to Others: No Duty to Warn:no Physical Aggression / Violence:No  Access to Firearms a concern: No  Gang Involvement:No   Patient / guardian was educated about steps to take if suicide or homicide risk level increases between visits. While future psychiatric events cannot be accurately predicted, the patient does not currently require acute inpatient psychiatric care and does not currently meet [facility] involuntary commitment criteria.  Subjective: Patient arrived on time for today's session.  Discussed progress.  She shared continued medical issues.  She has discussed with her doctor recently her condition, she continues to follow through with all recommendations.  Continues physical therapy.  It has been determined that she will have surgery possibly in the next month or so.  Patient coping with chronic pain, acute levels intermittent throughout the day.  Reviewed diaphragmatic breathing, mindfulness concepts to cope with the pain levels as well as day-to-day stress as she continues to cope with financial and some relational stressors.  Support was  provided continuing to work with patient from a solution focused strength-based approach..   Interventions:CBT, Solution Focused and Strength-based  Diagnosis:   ICD-10-CM   1. Major depressive disorder, recurrent episode, moderate (HCC) F33.1      Plan:   1.  Patient to continue to engage in individual counseling 2-4 times a month or as needed. 2.  Patient to identify and apply CBT, coping skills learned in session to decrease depression and anxiety symptoms. 3.  Patient to contact this office, go to the local ED or call 911 if a crisis or emergency develops between visits.  Waldron Session, [facility]

## 2018-11-22 NOTE — Progress Notes (Signed)
         [facility] Counselor/Therapist Progress Note   Patient ID: [name], MRN: [id number]  Date: 11/07/18  Timespent: 56 minutes  Treatment Type: Individual  Mental Status Exam:   Appearance:   Casual     Behavior:  Appropriate  Motor:  Normal  Speech/Language:   Normal Rate  Affect:  Full Range  Mood:  anxious  Thought process:  Coherent and Relevant  Thought content:    Logical  Perceptual disturbances:    Normal  Orientation:  Full (Time, Place, and Person)  Attention:  Good  Concentration:  good  Memory:  Immediate  Fund of knowledge:   Good  Insight:    developing  Judgment:   Good  Impulse Control:  good    Reported Symptoms: Some feelings of depression, anxiety most days, rumination, fatigue, anhedonia, some sleep disturbances  Risk Assessment: Danger to Self:  No Self-injurious Behavior: No Danger to Others: No Duty to Warn:no Physical Aggression / Violence:No  Access to Firearms a concern: No  Gang Involvement:No   Patient / guardian was educated about steps to take if suicide or homicide risk level increases between visits. While future psychiatric events cannot be accurately predicted, the patient does not currently require acute inpatient psychiatric care and does not currently meet [facility] involuntary commitment criteria.  Subjective: Patient arrived on time for today's session.  Discussed progress and events since last visit.  She shared medical related stressors and her recent doctor appointments.  She shared some of those experiences and plan specifically made for her to have back surgery as recommended by her doctor.  Patient shared her continued financial stress as well as chronic pain levels associated with her back.  Ways to cope, care for herself were explored.  She continues to set boundaries and some relationships with effectiveness.  Allowing herself to manage anxiety producing thoughts can be a daily struggle based on the  multiple stressors she has been coping with over the last few months.  Provide support, normalizing some of her thoughts and feelings with a focus on her evidenced ability to manage her stressors effectively often during this process.  Encouraged self-care continuing work with patient from a cognitive behavioral framework.  Interventions:CBT, Solution Focused and Strength-based  Diagnosis:   ICD-10-CM   1. Major depressive disorder, recurrent episode, moderate (HCC) F33.1      Plan:   1.  Patient to continue to engage in individual counseling 2-4 times a month or as needed. 2.  Patient to identify and apply CBT, coping skills learned in session to decrease depression and anxiety symptoms. 3.  Patient to contact this office, go to the local ED or call 911 if a crisis or emergency develops between visits.  Waldron Session, [facility]

## 2018-11-26 ENCOUNTER — Ambulatory Visit: Payer: 59 | Admitting: Mental Health

## 2018-12-03 ENCOUNTER — Ambulatory Visit (INDEPENDENT_AMBULATORY_CARE_PROVIDER_SITE_OTHER): Payer: 59 | Admitting: Mental Health

## 2018-12-03 DIAGNOSIS — F331 Major depressive disorder, recurrent, moderate: Secondary | ICD-10-CM

## 2018-12-03 NOTE — Progress Notes (Signed)
         [facility] Counselor/Therapist Progress Note   Patient ID: [name], MRN: [id number]  Date: 12/03/2018  Timespent: 55 minutes  Treatment Type: Individual  Mental Status Exam:   Appearance:   Casual     Behavior:  Appropriate  Motor:  Normal  Speech/Language:   Normal Rate  Affect:  Full Range  Mood:  anxious  Thought process:  Coherent and Relevant  Thought content:    Logical  Perceptual disturbances:    Normal  Orientation:  Full (Time, Place, and Person)  Attention:  Good  Concentration:  good  Memory:  Immediate  Fund of knowledge:   Good  Insight:    Good  Judgment:   Good  Impulse Control:  good    Reported Symptoms: Some feelings of depression, anxiety most days, rumination, fatigue, anhedonia, some sleep disturbances  Risk Assessment: Danger to Self:  No Self-injurious Behavior: No Danger to Others: No Duty to Warn:no Physical Aggression / Violence:No  Access to Firearms a concern: No  Gang Involvement:No   Patient / guardian was educated about steps to take if suicide or homicide risk level increases between visits. While future psychiatric events cannot be accurately predicted, the patient does not currently require acute inpatient psychiatric care and does not currently meet [facility] involuntary commitment criteria.  Subjective: Patient arrived on time for today's session.  She shared how she is caring for herself, attending doctor appointments and awaiting her coming surgery which is in approximately 2 weeks.  She shared family relationships.  She continues to have some relationship strain with her mother.  We reviewed some ways to communicate effectively, she continues to effectively set boundaries as needed.  She continues to cope with significant life adjustments related to financial and employment.  Patient was encouraged to focus on her health as she has been consistent in this area as it is essential.  She continues to work on  Actuary anxiety producing thinking to keep her mood stable.  Encouraged self-care related to sleep hygiene, getting enough rest prior to her coming surgery.  Interventions:CBT, Solution Focused and Strength-based  Diagnosis:   ICD-10-CM   1. Major depressive disorder, recurrent episode, moderate (HCC) F33.1      Plan:   1.  Patient to continue to engage in individual counseling 2-4 times a month or as needed. 2.  Patient to identify and apply CBT, coping skills learned in session to decrease depression and anxiety symptoms. 3.  Patient to contact this office, go to the local ED or call 911 if a crisis or emergency develops between visits.  Waldron Session, [facility]

## 2018-12-07 DIAGNOSIS — Z23 Encounter for immunization: Secondary | ICD-10-CM | POA: Diagnosis not present

## 2018-12-10 ENCOUNTER — Ambulatory Visit (INDEPENDENT_AMBULATORY_CARE_PROVIDER_SITE_OTHER): Payer: No Typology Code available for payment source | Admitting: Surgery

## 2018-12-10 ENCOUNTER — Other Ambulatory Visit: Payer: Self-pay

## 2018-12-10 ENCOUNTER — Ambulatory Visit: Payer: 59 | Admitting: Mental Health

## 2018-12-10 ENCOUNTER — Encounter: Payer: Self-pay | Admitting: Surgery

## 2018-12-10 VITALS — BP 93/63 | HR 83 | Temp 97.3°F | Resp 20 | Ht 67.0 in | Wt 128.6 lb

## 2018-12-10 DIAGNOSIS — M479 Spondylosis, unspecified: Secondary | ICD-10-CM

## 2018-12-10 NOTE — Progress Notes (Signed)
[facility]ist of [place]  Patient name: [name] MRN: [id number] DOB: [birth year] Sex: female   REQUESTING PROVIDER:    Dr. Shon Baton   REASON FOR CONSULT:    ALIF  HISTORY OF PRESENT ILLNESS:   [name] is a 53 y.o. female, who is referred today for evaluation for anterior exposure L5-S1.  The patient has been recommended to have anterior instrumentation.  She is a non-smoker.  Her only abdominal operation was a partial hysterectomy 2018  PAST MEDICAL HISTORY    Past Medical History:  Diagnosis Date  . Anxiety   . Asthma    seasonal  . Pelvic pain      FAMILY HISTORY   Family History  Problem Relation Age of Onset  . Hypertension Mother   . Breast cancer Other        GREAT AUNT    SOCIAL HISTORY:   Social History   Socioeconomic History  . Marital status: Divorced    Spouse name: Not on file  . Number of children: Not on file  . Years of education: Not on file  . Highest education level: Not on file  Occupational History  . Not on file  Social Needs  . Financial resource strain: Not on file  . Food insecurity:    Worry: Not on file    Inability: Not on file  . Transportation needs:    Medical: Not on file    Non-medical: Not on file  Tobacco Use  . Smoking status: Never Smoker  . Smokeless tobacco: Never Used  Substance and Sexual Activity  . Alcohol use: Yes    Alcohol/week: 1.0 standard drinks    Types: 1 Glasses of wine per week    Comment: rarely   . Drug use: No  . Sexual activity: Not Currently  Lifestyle  . Physical activity:    Days per week: Not on file    Minutes per session: Not on file  . Stress: Not on file  Relationships  . Social connections:    Talks on phone: Not on file    Gets together: Not on file    Attends religious service: Not on file    Active member of club or organization: Not on file    Attends meetings of clubs or organizations: Not on  file    Relationship status: Not on file  . Intimate partner violence:    Fear of current or ex partner: Not on file    Emotionally abused: Not on file    Physically abused: Not on file    Forced sexual activity: Not on file  Other Topics Concern  . Not on file  Social History Narrative  . Not on file    ALLERGIES:    No Known Allergies  CURRENT MEDICATIONS:    Current Outpatient Medications  Medication Sig Dispense Refill  . acetaminophen (TYLENOL) 500 MG tablet Take 500-1,000 mg by mouth every 6 (six) hours as needed for moderate pain.    Marland Kitchen ALPRAZolam (XANAX) 1 MG tablet Take 1 tablet (1 mg total) by mouth at bedtime. 30 tablet 5  . amoxicillin-clavulanate (AUGMENTIN) 875-125 MG tablet Take 1 tablet by mouth 2 (two) times daily. 20 tablet 0  . diphenhydrAMINE (BENADRYL) 25 MG tablet Take 25 mg by mouth every 6 (six) hours as needed.    Marland Kitchen FLUTICASONE FUROATE NA Place 50 mcg into the nose.    Marland Kitchen Fluticasone Propionate, Inhal, (FLOVENT DISKUS) 100 MCG/BLIST AEPB INHALE  100 MCG INTO THE LUNGS DAILY. (Patient taking differently: Take 100 mcg by mouth daily as needed (sob). ) 60 each 5  . ibuprofen (ADVIL,MOTRIN) 600 MG tablet Take 1 tablet (600 mg total) by mouth every 8 (eight) hours as needed. 60 tablet 1  . ibuprofen (ADVIL,MOTRIN) 800 MG tablet Take 800 mg by mouth every 8 (eight) hours as needed.    . Melatonin 3 MG CAPS Take 3 mg by mouth daily.    . montelukast (SINGULAIR) 10 MG tablet Take 1 tablet (10 mg total) by mouth at bedtime. 90 tablet 3  . Multiple Vitamin (MULTIVITAMIN) tablet Take 1 tablet by mouth daily.    Marland Kitchen PROAIR HFA 108 (90 Base) MCG/ACT inhaler TAKE 2 PUFFS BY MOUTH EVERY 6 HOURS AS NEEDED FOR WHEEZE OR SHORTNESS OF BREATH (Patient taking differently: Inhale 2 puffs into the lungs every 6 (six) hours as needed for shortness of breath. ) 8.5 Inhaler 5  . rizatriptan (MAXALT-MLT) 10 MG disintegrating tablet Take 1 tablet (10 mg total) by mouth as needed for  migraine. May repeat in 2 hours if needed 30 tablet 3  . traMADol (ULTRAM) 50 MG tablet Take 50 mg by mouth 3 (three) times daily as needed.    . valACYclovir (VALTREX) 500 MG tablet Take 1 tablet (500 mg total) by mouth 2 (two) times daily. (Patient taking differently: Take 500 mg by mouth 2 (two) times daily as needed (outbreak). ) 180 tablet 3   No current facility-administered medications for this visit.     REVIEW OF SYSTEMS:   [X]  denotes positive finding, [ ]  denotes negative finding Cardiac  Comments:  Chest pain or chest pressure:    Shortness of breath upon exertion:    Short of breath when lying flat:    Irregular heart rhythm:        Vascular    Pain in calf, thigh, or hip brought on by ambulation:    Pain in feet at night that wakes you up from your sleep:     Blood clot in your veins:    Leg swelling:         Pulmonary    Oxygen at home:    Productive cough:     Wheezing:         Neurologic    Sudden weakness in arms or legs:     Sudden numbness in arms or legs:     Sudden onset of difficulty speaking or slurred speech:    Temporary loss of vision in one eye:     Problems with dizziness:         Gastrointestinal    Blood in stool:      Vomited blood:         Genitourinary    Burning when urinating:     Blood in urine:        Psychiatric    Major depression:         Hematologic    Bleeding problems:    Problems with blood clotting too easily:        Skin    Rashes or ulcers:        Constitutional    Fever or chills:     PHYSICAL EXAM:   Vitals:   12/10/18 1123  BP: 93/63  Pulse: 83  Resp: 20  Temp: (!) 97.3 F (36.3 C)  SpO2: 99%  Weight: 58.3 kg  Height: 5\' 7"  (1.702 m)    GENERAL: The patient is a  well-nourished female, in no acute distress. The vital signs are documented above. CARDIAC: There is a regular rate and rhythm.  VASCULAR: Palpable pedal pulses PULMONARY: Nonlabored respirations MUSCULOSKELETAL: There are no major  deformities or cyanosis. NEUROLOGIC: No focal weakness or paresthesias are detected. SKIN: There are no ulcers or rashes noted. PSYCHIATRIC: The patient has a normal affect.  STUDIES:   I have reviewed her MRI  ASSESSMENT and PLAN   Degenerative back disease, L5-S1: We discussed the details of the procedure including a left lower quadrant transverse incision.  We discussed the risk of injury to the artery and vein as well as the ureter.  We also discussed the risk of wound complications and hernia.  All her questions were answered.   Charlena CrossWells Cathey Fredenburg, IV, MD, FACS [facility] Tel [phone] Pager [phone]

## 2018-12-11 NOTE — Pre-Procedure Instructions (Signed)
GAONOU HELING  12/11/2018      [facility] - [place], [place] - [address] [facility] [address] Phone: [phone] Fax: [phone]    Your procedure is scheduled on March 26  Report to [facility] Admitting at 0530 A.M.  Call this number if you have problems the morning of surgery:  [phone]   Remember:  Do not eat or drink after midnight.    Take these medicines the morning of surgery with A SIP OF WATER  acetaminophen (TYLENOL) if needed amoxicillin-clavulanate (AUGMENTIN) FLUTICASONE FUROATE NA Fluticasone Propionate, Inhal, (FLOVENT DISKUS) if needed PROAIR HFA  If needed rizatriptan (MAXALT-MLT) if needed traMADol (ULTRAM) if needed valACYclovir (VALTREX) if needed  7 days prior to surgery STOP taking any Aspirin (unless otherwise instructed by your surgeon), Aleve, Naproxen, Ibuprofen, Motrin, Advil, Goody's, BC's, all herbal medications, fish oil, and all vitamins.     Do not wear jewelry, make-up or nail polish.  Do not wear lotions, powders, or perfumes, or deodorant.  Do not shave 48 hours prior to surgery.    Do not bring valuables to the hospital.  [facility] is not responsible for any belongings or valuables.  Contacts, dentures or bridgework may not be worn into surgery.  Leave your suitcase in the car.  After surgery it may be brought to your room.  For patients admitted to the hospital, discharge time will be determined by your treatment team.  Patients discharged the day of surgery will not be allowed to drive home.    Special instructions:   [place]- Preparing For Surgery  Before surgery, you can play an important role. Because skin is not sterile, your skin needs to be as free of germs as possible. You can reduce the number of germs on your skin by washing with CHG (chlorahexidine gluconate) Soap before surgery.  CHG is an antiseptic cleaner which kills germs and bonds with the skin  to continue killing germs even after washing.    Oral Hygiene is also important to reduce your risk of infection.  Remember - BRUSH YOUR TEETH THE MORNING OF SURGERY WITH YOUR REGULAR TOOTHPASTE  Please do not use if you have an allergy to CHG or antibacterial soaps. If your skin becomes reddened/irritated stop using the CHG.  Do not shave (including legs and underarms) for at least 48 hours prior to first CHG shower. It is OK to shave your face.  Please follow these instructions carefully.   1. Shower the NIGHT BEFORE SURGERY and the MORNING OF SURGERY with CHG.   2. If you chose to wash your hair, wash your hair first as usual with your normal shampoo.  3. After you shampoo, rinse your hair and body thoroughly to remove the shampoo.  4. Use CHG as you would any other liquid soap. You can apply CHG directly to the skin and wash gently with a scrungie or a clean washcloth.   5. Apply the CHG Soap to your body ONLY FROM THE NECK DOWN.  Do not use on open wounds or open sores. Avoid contact with your eyes, ears, mouth and genitals (private parts). Wash Face and genitals (private parts)  with your normal soap.  6. Wash thoroughly, paying special attention to the area where your surgery will be performed.  7. Thoroughly rinse your body with warm water from the neck down.  8. DO NOT shower/wash with your normal soap after using and rinsing off the  CHG Soap.  9. Pat yourself dry with a CLEAN TOWEL.  10. Wear CLEAN PAJAMAS to bed the night before surgery, wear comfortable clothes the morning of surgery  11. Place CLEAN SHEETS on your bed the night of your first shower and DO NOT SLEEP WITH PETS.    Day of Surgery:  Do not apply any deodorants/lotions.  Please wear clean clothes to the hospital/surgery center.   Remember to brush your teeth WITH YOUR REGULAR TOOTHPASTE.    Please read over the following fact sheets that you were given.

## 2018-12-12 ENCOUNTER — Encounter (HOSPITAL_COMMUNITY)
Admission: RE | Admit: 2018-12-12 | Discharge: 2018-12-12 | Disposition: A | Discharge status: Home / Self Care | Payer: No Typology Code available for payment source | Source: Ambulatory Visit | Attending: Orthopedic Surgery | Admitting: Orthopedic Surgery

## 2018-12-12 ENCOUNTER — Other Ambulatory Visit: Payer: Self-pay

## 2018-12-12 ENCOUNTER — Encounter (HOSPITAL_COMMUNITY): Payer: Self-pay

## 2018-12-12 DIAGNOSIS — Z01811 Encounter for preprocedural respiratory examination: Secondary | ICD-10-CM | POA: Diagnosis not present

## 2018-12-12 DIAGNOSIS — Z01818 Encounter for other preprocedural examination: Secondary | ICD-10-CM | POA: Diagnosis not present

## 2018-12-12 LAB — CBC
HCT: 45.9 % (ref 36.0–46.0)
Hemoglobin: 14.2 g/dL (ref 12.0–15.0)
MCH: 28.8 pg (ref 26.0–34.0)
MCHC: 30.9 g/dL (ref 30.0–36.0)
MCV: 93.1 fL (ref 80.0–100.0)
NRBC: 0 % (ref 0.0–0.2)
Platelets: 303 10*3/uL (ref 150–400)
RBC: 4.93 MIL/uL (ref 3.87–5.11)
RDW: 12.7 % (ref 11.5–15.5)
WBC: 5.9 10*3/uL (ref 4.0–10.5)

## 2018-12-12 LAB — TYPE AND SCREEN
ABO/RH(D): AB POS
Antibody Screen: NEGATIVE

## 2018-12-12 LAB — BASIC METABOLIC PANEL
ANION GAP: 12 (ref 5–15)
BUN: 15 mg/dL (ref 6–20)
CO2: 24 mmol/L (ref 22–32)
Calcium: 10 mg/dL (ref 8.9–10.3)
Chloride: 104 mmol/L (ref 98–111)
Creatinine, Ser: 0.89 mg/dL (ref 0.44–1.00)
GFR calc Af Amer: >60 mL/min (ref >60)
GFR calc non Af Amer: >60 mL/min (ref >60)
Glucose, Bld: 102 mg/dL — ABNORMAL HIGH (ref 70–99)
Potassium: 3.5 mmol/L (ref 3.5–5.1)
Sodium: 140 mmol/L (ref 135–145)

## 2018-12-12 LAB — ABO/RH: ABO/RH(D): AB POS

## 2018-12-12 LAB — SURGICAL PCR SCREEN
MRSA, PCR: NEGATIVE
Staphylococcus aureus: NEGATIVE

## 2018-12-12 NOTE — Progress Notes (Signed)

## 2018-12-12 NOTE — Progress Notes (Signed)
PCP - Gershon Crane Cardiologist - denies  Chest x-ray - 12/12/18 EKG - not needed Stress Test - denies ECHO - denies Cardiac Cath - denies  Anesthesia review: NO  Patient denies shortness of breath, fever, cough and chest pain at PAT appointment   Patient verbalized understanding of instructions that were given to them at the PAT appointment. Patient was also instructed that they will need to review over the PAT instructions again at home before surgery.

## 2018-12-14 ENCOUNTER — Ambulatory Visit: Payer: 59 | Admitting: Mental Health

## 2018-12-20 ENCOUNTER — Inpatient Hospital Stay (HOSPITAL_COMMUNITY)
Admission: RE | Admit: 2018-12-20 | Payer: No Typology Code available for payment source | Source: Home / Self Care | Admitting: Orthopedic Surgery

## 2018-12-20 ENCOUNTER — Encounter (HOSPITAL_COMMUNITY): Admission: RE | Payer: Self-pay | Source: Home / Self Care

## 2018-12-20 SURGERY — ANTERIOR LUMBAR FUSION 1 LEVEL
Anesthesia: General

## 2019-01-02 ENCOUNTER — Ambulatory Visit (INDEPENDENT_AMBULATORY_CARE_PROVIDER_SITE_OTHER): Payer: Self-pay | Admitting: Family Medicine

## 2019-01-02 ENCOUNTER — Other Ambulatory Visit: Payer: Self-pay

## 2019-01-02 ENCOUNTER — Encounter: Payer: Self-pay | Admitting: Family Medicine

## 2019-01-02 DIAGNOSIS — J019 Acute sinusitis, unspecified: Secondary | ICD-10-CM

## 2019-01-02 MED ORDER — AMOXICILLIN-POT CLAVULANATE 875-125 MG PO TABS: 1.0000 | ORAL_TABLET | Freq: Two times a day (BID) | ORAL | 0 refills | Status: DC

## 2019-01-02 MED ORDER — METHYLPREDNISOLONE 4 MG PO TBPK: ORAL_TABLET | ORAL | 0 refills | Status: DC

## 2019-01-02 NOTE — Progress Notes (Signed)
Subjective:    Patient ID: [name], female    DOB: 05/08/1966, 53 y.o.   MRN: [id number]  HPI Virtual Visit via Video Note  I connected with the patient on 01/02/19 at  1:15 PM EDT by a video enabled telemedicine application and verified that I am speaking with the correct person using two identifiers.  Location patient: home Location provider:work or home office Persons participating in the virtual visit: patient, provider  I discussed the limitations of evaluation and management by telemedicine and the availability of in person appointments. The patient expressed understanding and agreed to proceed.   HPI: She reports one week of sinus pressure, PND, and a dry cough. Her allergies have been flaring up due to the pollen outside. No fever or headache or SOB or NVD.    ROS: See pertinent positives and negatives per HPI.  Past Medical History:  Diagnosis Date  . Anxiety   . Asthma    seasonal  . Pelvic pain     Past Surgical History:  Procedure Laterality Date  . CERVICAL BIOPSY  W/ LOOP ELECTRODE EXCISION     NOV 2014  . CHOLECYSTECTOMY    . LAPAROSCOPIC SALPINGO OOPHERECTOMY Bilateral 03/16/2017   Procedure: LAPAROSCOPIC SALPINGO OOPHORECTOMY;  Surgeon: [name], Timothy P, MD;  Location: [facility];  Service: Gynecology;  Laterality: Bilateral;  . OPEN REDUCTION INTERNAL FIXATION (ORIF) DISTAL RADIAL FRACTURE Right 05/02/2018   Procedure: OPEN REDUCTION INTERNAL FIXATION (ORIF) DISTAL RADIAL FRACTURE;  Surgeon: Bradly Bienenstockrtmann, Fred, MD;  Location: [facility] OR;  Service: Orthopedics;  Laterality: Right;  . PELVIC LAPAROSCOPY      Family History  Problem Relation Age of Onset  . Hypertension Mother   . Breast cancer Other        GREAT AUNT     Current Outpatient Medications:  .  acetaminophen (TYLENOL) 500 MG tablet, Take 500-1,000 mg by mouth every 6 (six) hours as needed for moderate pain., Disp: , Rfl:  .  ALPRAZolam (XANAX) 1 MG tablet, Take 1  tablet (1 mg total) by mouth at bedtime., Disp: 30 tablet, Rfl: 5 .  diphenhydrAMINE (BENADRYL) 25 MG tablet, Take 25 mg by mouth every 6 (six) hours as needed., Disp: , Rfl:  .  FLUTICASONE FUROATE NA, Place 50 mcg into the nose., Disp: , Rfl:  .  Fluticasone Propionate, Inhal, (FLOVENT DISKUS) 100 MCG/BLIST AEPB, INHALE 100 MCG INTO THE LUNGS DAILY. (Patient taking differently: Take 100 mcg by mouth daily as needed (sob). ), Disp: 60 each, Rfl: 5 .  ibuprofen (ADVIL,MOTRIN) 600 MG tablet, Take 1 tablet (600 mg total) by mouth every 8 (eight) hours as needed., Disp: 60 tablet, Rfl: 1 .  ibuprofen (ADVIL,MOTRIN) 800 MG tablet, Take 800 mg by mouth every 8 (eight) hours as needed., Disp: , Rfl:  .  Melatonin 3 MG CAPS, Take 3 mg by mouth daily., Disp: , Rfl:  .  montelukast (SINGULAIR) 10 MG tablet, Take 1 tablet (10 mg total) by mouth at bedtime., Disp: 90 tablet, Rfl: 3 .  Multiple Vitamin (MULTIVITAMIN) tablet, Take 1 tablet by mouth daily., Disp: , Rfl:  .  PROAIR HFA 108 (90 Base) MCG/ACT inhaler, TAKE 2 PUFFS BY MOUTH EVERY 6 HOURS AS NEEDED FOR WHEEZE OR SHORTNESS OF BREATH (Patient taking differently: Inhale 2 puffs into the lungs every 6 (six) hours as needed for shortness of breath. ), Disp: 8.5 Inhaler, Rfl: 5 .  rizatriptan (MAXALT-MLT) 10 MG disintegrating tablet, Take 1 tablet (10 mg  total) by mouth as needed for migraine. May repeat in 2 hours if needed, Disp: 30 tablet, Rfl: 3 .  traMADol (ULTRAM) 50 MG tablet, Take 50 mg by mouth 3 (three) times daily as needed., Disp: , Rfl:  .  valACYclovir (VALTREX) 500 MG tablet, Take 1 tablet (500 mg total) by mouth 2 (two) times daily. (Patient taking differently: Take 500 mg by mouth 2 (two) times daily as needed (outbreak). ), Disp: 180 tablet, Rfl: 3 .  amoxicillin-clavulanate (AUGMENTIN) 875-125 MG tablet, Take 1 tablet by mouth 2 (two) times daily., Disp: 20 tablet, Rfl: 0 .  methylPREDNISolone (MEDROL DOSEPAK) 4 MG TBPK tablet, As directed,  Disp: 21 tablet, Rfl: 0  EXAM:  VITALS per patient if applicable:  GENERAL: alert, oriented, appears well and in no acute distress  HEENT: atraumatic, conjunttiva clear, no obvious abnormalities on inspection of external nose and ears  NECK: normal movements of the head and neck  LUNGS: on inspection no signs of respiratory distress, breathing rate appears normal, no obvious gross SOB, gasping or wheezing  CV: no obvious cyanosis  MS: moves all visible extremities without noticeable abnormality  PSYCH/NEURO: pleasant and cooperative, no obvious depression or anxiety, speech and thought processing grossly intact  ASSESSMENT AND PLAN: Sinusitis, treat with Augmentin and a Medrol dose pack.  Gershon Crane, MD  Discussed the following assessment and plan:  No diagnosis found.     I discussed the assessment and treatment plan with the patient. The patient was provided an opportunity to ask questions and all were answered. The patient agreed with the plan and demonstrated an understanding of the instructions.   The patient was advised to call back or seek an in-person evaluation if the symptoms worsen or if the condition fails to improve as anticipated.     Review of Systems     Objective:   Physical Exam        Assessment & Plan:

## 2019-01-10 ENCOUNTER — Other Ambulatory Visit: Payer: Self-pay

## 2019-01-10 ENCOUNTER — Ambulatory Visit (INDEPENDENT_AMBULATORY_CARE_PROVIDER_SITE_OTHER): Payer: 59 | Admitting: Mental Health

## 2019-01-10 DIAGNOSIS — F331 Major depressive disorder, recurrent, moderate: Secondary | ICD-10-CM | POA: Diagnosis not present

## 2019-01-10 NOTE — Progress Notes (Signed)
         [facility] Counselor/Therapist Progress Note   Patient ID: [name], MRN: [id number]  Date: 01/10/2019  Timespent: 53 minutes  Treatment Type: Individual  Mental Status Exam:   Appearance:   Casual     Behavior:  Appropriate  Motor:  Normal  Speech/Language:   Normal Rate  Affect:  Full Range  Mood:  anxious  Thought process:  Coherent and Relevant  Thought content:    Logical  Perceptual disturbances:    Normal  Orientation:  Full (Time, Place, and Person)  Attention:  Good  Concentration:  good  Memory:  Immediate  Fund of knowledge:   Good  Insight:    Good  Judgment:   Good  Impulse Control:  good    Reported Symptoms: Some feelings of depression, anxiety most days, rumination, fatigue, anhedonia, some sleep disturbances  Risk Assessment: Danger to Self:  No Self-injurious Behavior: No Danger to Others: No Duty to Warn:no Physical Aggression / Violence:No  Access to Firearms a concern: No  Gang Involvement:No   Patient / guardian was educated about steps to take if suicide or homicide risk level increases between visits. While future psychiatric events cannot be accurately predicted, the patient does not currently require acute inpatient psychiatric care and does not currently meet [facility] involuntary commitment criteria.  Subjective: Patient engaged in teletherapy session.  Due to the recent viral pandemic, she has been unable to have her surgery. She hopes to in the next few weeks. Patient was encouraged to continue to focus on her health as she has been consistent in this area remains essential.  She continues to cope w/ the relationship stress with that of her mother. She processed feelings and how she plans to set boundaries in a healthy manner.  Encouraged self-care related to sleep hygiene, getting enough rest as she awaits her coming surgery yet to be rescheduled.  Virtual Visit via Telephone Note I connected with patient by a  video enabled telemedicine application or telephone, with their informed consent, and verified patient privacy and that I am speaking with the correct person using two identifiers. I discussed the limitations, risks, security and privacy concerns of performing psychotherapy and management service by telephone and the availability of in person appointments. I also discussed with the patient that there may be a patient responsible charge related to this service. The patient expressed understanding and agreed to proceed. I discussed the treatment planning with the patient. The patient was provided an opportunity to ask questions and all were answered. The patient agreed with the plan and demonstrated an understanding of the instructions. The patient was advised to call  our office if  symptoms worsen or feel they are in a crisis state and need immediate contact.  Interventions: CBT, Solution Focused and Strength-based  Diagnosis:   ICD-10-CM   1. Major depressive disorder, recurrent episode, moderate (HCC) F33.1     Plan:   1.  Patient to continue to engage in individual counseling 2-4 times a month or as needed. 2.  Patient to identify and apply CBT, coping skills learned in session to decrease depression and anxiety symptoms. 3.  Patient to contact this office, go to the local ED or call 911 if a crisis or emergency develops between visits.  Waldron Session, [facility]

## 2019-01-15 ENCOUNTER — Ambulatory Visit: Payer: Self-pay | Admitting: Mental Health

## 2019-01-16 ENCOUNTER — Encounter: Payer: Self-pay | Admitting: Family Medicine

## 2019-01-16 ENCOUNTER — Other Ambulatory Visit: Payer: Self-pay | Admitting: Family Medicine

## 2019-01-16 MED ORDER — FLUTICASONE PROPIONATE 50 MCG/ACT NA SUSP: 2.0000 | Freq: Every day | NASAL | 6 refills | Status: DC

## 2019-01-16 MED ORDER — AMOXICILLIN-POT CLAVULANATE 875-125 MG PO TABS: 1.0000 | ORAL_TABLET | Freq: Two times a day (BID) | ORAL | 0 refills | Status: DC

## 2019-01-16 NOTE — Telephone Encounter (Signed)
Call in Augmentin [phone] bid for 10 days  

## 2019-01-16 NOTE — Telephone Encounter (Signed)
Dr. Fry please advise. Thanks  

## 2019-01-17 ENCOUNTER — Other Ambulatory Visit: Payer: Self-pay | Admitting: *Deleted

## 2019-01-18 ENCOUNTER — Ambulatory Visit: Payer: Self-pay | Admitting: Orthopedic Surgery

## 2019-01-18 NOTE — H&P (Signed)
Subjective:   Patient is a 53 y.o. female presented with a history of DDD (L5/S1) with bilateral radicular leg pain. Onset of symptoms was 10/12/2018 with increasing, debilitating pain and decrease in quality of life since that time despite conservative treatment measures including, PT, injections, OTC medications, and prescription medications. She is being admitted for surgical management of this condition. The indications for the procedure include increasing, debilitating pain and decrease in quality of life since that time despite conservative treatment measures including, PT, injections, OTC medications, and prescription medications.  Patient Active Problem List   Diagnosis Date Noted  . Environmental allergies 12/06/2017  . Asthma 12/06/2017  . Depression, recurrent (HCC) 07/10/2017  . Anxiety and depression 07/10/2017  . Bilateral ovarian cysts 06/13/2017  . Herpes simplex 06/13/2017   Past Medical History:  Diagnosis Date  . Anxiety   . Asthma    seasonal  . Pelvic pain     Past Surgical History:  Procedure Laterality Date  . CERVICAL BIOPSY  W/ LOOP ELECTRODE EXCISION     NOV 2014  . CHOLECYSTECTOMY    . LAPAROSCOPIC SALPINGO OOPHERECTOMY Bilateral 03/16/2017   Procedure: LAPAROSCOPIC SALPINGO OOPHORECTOMY;  Surgeon: Dara Lords, MD;  Location: [facility];  Service: Gynecology;  Laterality: Bilateral;  . OPEN REDUCTION INTERNAL FIXATION (ORIF) DISTAL RADIAL FRACTURE Right 05/02/2018   Procedure: OPEN REDUCTION INTERNAL FIXATION (ORIF) DISTAL RADIAL FRACTURE;  Surgeon: Bradly Bienenstock, MD;  [facility];  Service: Orthopedics;  Laterality: Right;  . PELVIC LAPAROSCOPY      Current Outpatient Medications  Medication Sig Dispense Refill Last Dose  . acetaminophen (TYLENOL) 500 MG tablet Take 500-1,000 mg by mouth every 6 (six) hours as needed for moderate pain.   Taking  . ALPRAZolam (XANAX) 1 MG tablet Take 1 tablet (1 mg total) by mouth at bedtime. 30  tablet 5 Taking  . amoxicillin-clavulanate (AUGMENTIN) 875-125 MG tablet Take 1 tablet by mouth 2 (two) times daily. 20 tablet 0   . amoxicillin-clavulanate (AUGMENTIN) 875-125 MG tablet Take 1 tablet by mouth 2 (two) times daily. 20 tablet 0   . diphenhydrAMINE (BENADRYL) 25 MG tablet Take 25 mg by mouth every 6 (six) hours as needed.   Taking  . fluticasone (FLONASE) 50 MCG/ACT nasal spray Place 2 sprays into both nostrils daily. 16 g 6   . FLUTICASONE FUROATE NA Place 50 mcg into the nose.   Taking  . Fluticasone Propionate, Inhal, (FLOVENT DISKUS) 100 MCG/BLIST AEPB INHALE 100 MCG INTO THE LUNGS DAILY. (Patient taking differently: Take 100 mcg by mouth daily as needed (sob). ) 60 each 5 Taking  . ibuprofen (ADVIL,MOTRIN) 600 MG tablet Take 1 tablet (600 mg total) by mouth every 8 (eight) hours as needed. 60 tablet 1 Taking  . ibuprofen (ADVIL,MOTRIN) 800 MG tablet Take 800 mg by mouth every 8 (eight) hours as needed.   Taking  . Melatonin 3 MG CAPS Take 3 mg by mouth daily.   Taking  . methylPREDNISolone (MEDROL DOSEPAK) 4 MG TBPK tablet As directed 21 tablet 0   . montelukast (SINGULAIR) 10 MG tablet Take 1 tablet (10 mg total) by mouth at bedtime. 90 tablet 3 Taking  . Multiple Vitamin (MULTIVITAMIN) tablet Take 1 tablet by mouth daily.   Taking  . PROAIR HFA 108 (90 Base) MCG/ACT inhaler TAKE 2 PUFFS BY MOUTH EVERY 6 HOURS AS NEEDED FOR WHEEZE OR SHORTNESS OF BREATH (Patient taking differently: Inhale 2 puffs into the lungs every 6 (six) hours as  needed for shortness of breath. ) 8.5 Inhaler 5 Taking  . rizatriptan (MAXALT-MLT) 10 MG disintegrating tablet Take 1 tablet (10 mg total) by mouth as needed for migraine. May repeat in 2 hours if needed 30 tablet 3 Taking  . traMADol (ULTRAM) 50 MG tablet Take 50 mg by mouth 3 (three) times daily as needed.   Taking  . valACYclovir (VALTREX) 500 MG tablet Take 1 tablet (500 mg total) by mouth 2 (two) times daily. (Patient taking differently: Take 500  mg by mouth 2 (two) times daily as needed (outbreak). ) 180 tablet 3 Taking   No current facility-administered medications for this visit.    No Known Allergies  Social History   Tobacco Use  . Smoking status: Never Smoker  . Smokeless tobacco: Never Used  Substance Use Topics  . Alcohol use: Not Currently    Alcohol/week: 1.0 standard drinks    Types: 1 Glasses of wine per week    Family History  Problem Relation Age of Onset  . Hypertension Mother   . Breast cancer Other        GRE[employer]UNT    Review of Systems As stated in HPI  Objective:   Clinical exam: Trula OreChristina is a pleasant individual, who appears younger than their stated age. She Is alert and orientated 3. No shortness of breath, chest pain. Abdomen is soft and non-tender, negative loss of bowel and bladder control, no rebound tenderness. Negative: skin lesions abrasions contusions  Lungs: Clear to auscultation bilaterally  Cardiac: Regular rate and rhythm no rubs gallops or murmurs  Peripheral pulses: 2+ dorsalis pedis/posterior tibialis pulses bilaterally. Compartment soft and nontender.  Gait pattern: Normal  Assistive devices: No assistive devices for ambulation  Neuro: 5/5 motor strength in the lower extremity bilaterally. Positive intermittent dysesthesias in the lower extremities. Negative nerve root tension signs. Negative Babinski test, no clonus, 2+ tendon reflexes bilaterally.  Musculoskeletal: Significant low back pain with palpation and range of motion. No SI joint pain. No significant hip, knee, ankle pain with isolated joint range of motion.  X-rays of the lumbar spine demonstrate significant collapse and degeneration of the L5-S1 disc space (greater than 50%). No acute fracture seen.  Lumbar MRI: completed on 10/16/18 was reviewed with the patient. I have also reviewed the radiology report. He had not acute wedge deformity of T12 no suspicious marrow signal abnormality. Mild to no significant  disease at the L1-L4 levels. L4-5 mild disc bulge no contact of the nerve root. No change from prior MRI in August 2019. Collapse and degeneration of the L5-S1 disc space with lateral recess moderate stenosis as well as foraminal stenosis.  Assessment:   1. 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 Lumbar radiculopathy M54.16: Radiculopathy, lumbar region  Trula OreChristina is a very pleasant 53 year old who has ongoing significant debilitating back buttock and intermittent bilateral lower extremity pain. Despite injection therapy, physical therapy, activity modification, and medications her quality-of-life is continued to deteriorate. At this point time the patient has discogenic back pain due to the collapse of the L5-S1 disc. The work-related injury April 28, 2018 represents an acute exacerbation of an underlying degenerative disease process. Unfortunately despite appropriate conservative management she has been unable to return to her preinjury level of function. As a result of the failure of prolonged conservative management we are going to move forward with surgery.  Plan:    We have gone over the anterior lumbar interbody fusion in great detail  as well as also viewing the animated medial on the surgery. All of her questions and concerns were addressed. We have again gone over the risks and benefits of surgery. The goal of surgery is to reduce nonlimiting her pain and thereby improve her quality-of-life. Overall success rate is 75% good to excellent results.  Risks and benefits of surgery were discussed with the patient. These include: Infection, bleeding, death, stroke, paralysis, ongoing or worse pain, need for additional surgery, nonunion, leak of spinal fluid, adjacent segment degeneration requiring additional fusion surgery, need for posterior decompression and/or fusion. Bleeding from major vessels, and blood clots (deep venous  thrombosis)requiring additional treatment. Due to the abdominal contents requiring further intervention, loss in bowel and bladder control.  Surgical plan: Anterior lumbar interbody fusion L5-S1  Approach Surgeon is Dr. Teodoro Kil. He has seen and evaluated the patient and has indicated that there are no contraindications to the retroperitoneal approach.  Due to patients progressive and debilitating back and radicular leg pain plan on moving forward with surgery in a urgent manner.

## 2019-01-22 ENCOUNTER — Ambulatory Visit: Payer: Self-pay | Admitting: Orthopedic Surgery

## 2019-01-22 NOTE — Pre-Procedure Instructions (Signed)
Levada DyChristina K Asante            01/22/19                          [facility] [place] [address] [facility] [id number] Phone: [phone] Fax: [phone]              Your procedure is scheduled on Tuesday, May 6th            [facility] Admitting at 6:30 A.M.            Call this number if you have problems the morning of surgery:            [phone]             Remember:            Do not eat or drink after midnight.                        Take these medicines the morning of surgery with A SIP OF WATER  acetaminophen (TYLENOL) if needed amoxicillin-clavulanate (AUGMENTIN) FLUTICASONE FUROATE NA Fluticasone Propionate, Inhal, (FLOVENT DISKUS) if needed PROAIR HFA  If needed rizatriptan (MAXALT-MLT) if needed traMADol (ULTRAM) if needed valACYclovir (VALTREX) if needed  As of today, STOP taking any Aspirin (unless otherwise instructed by your surgeon), Aleve, Naproxen, Ibuprofen, Motrin, Advil, Goody's, BC's, all herbal medications, fish oil, and all vitamins.                         Do not wear jewelry, make-up or nail polish.            Do not wear lotions, powders, or perfumes, or deodorant.            Do not shave 48 hours prior to surgery.              Do not bring valuables to the hospital.            [facility] is not responsible for any belongings or valuables.  Contacts, dentures or bridgework may not be worn into surgery.  Leave your suitcase in the car.  After surgery it may be brought to your room.  For patients admitted to the hospital, discharge time will be determined by your treatment team.  Patients discharged the day of surgery will not be allowed to drive home.    Special instructions:   [facility]- Preparing For Surgery  Before surgery, you can play an important role. Because skin is not sterile, your skin needs to be as free of germs as possible. You can reduce  the number of germs on your skin by washing with CHG (chlorahexidine gluconate) Soap before surgery.  CHG is an antiseptic cleaner which kills germs and bonds with the skin to continue killing germs even after washing.    Oral Hygiene is also important to reduce your risk of infection.  Remember - BRUSH YOUR TEETH THE MORNING OF SURGERY WITH YOUR REGULAR TOOTHPASTE  Please do not use if you have an allergy to CHG or antibacterial soaps. If your skin becomes reddened/irritated stop using the CHG.  Do not shave (including legs and underarms) for at least 48 hours prior to first CHG shower. It is OK to shave your face.  Please follow these instructions carefully.  1. Shower the NIGHT BEFORE SURGERY and the MORNING OF SURGERY with CHG.   2. If you chose to wash your hair, wash your hair first as usual with your normal shampoo.  3. After you shampoo, rinse your hair and body thoroughly to remove the shampoo.  4. Use CHG as you would any other liquid soap. You can apply CHG directly to the skin and wash gently with a scrungie or a clean washcloth.   5. Apply the CHG Soap to your body ONLY FROM THE NECK DOWN.  Do not use on open wounds or open sores. Avoid contact with your eyes, ears, mouth and genitals (private parts). Wash Face and genitals (private parts)  with your normal soap.  6. Wash thoroughly, paying special attention to the area where your surgery will be performed.  7. Thoroughly rinse your body with warm water from the neck down.  8. DO NOT shower/wash with your normal soap after using and rinsing off the CHG Soap.  9. Pat yourself dry with a CLEAN TOWEL.  10. Wear CLEAN PAJAMAS to bed the night before surgery, wear comfortable clothes the morning of surgery  11. Place CLEAN SHEETS on your bed the night of your first shower and DO NOT SLEEP WITH  PETS.    Day of Surgery:  Do not apply any deodorants/lotions.  Please wear clean clothes to the hospital/surgery center.   Remember to brush your teeth WITH YOUR REGULAR TOOTHPASTE.    Please read over the following fact sheets that you were given.

## 2019-01-23 ENCOUNTER — Encounter (HOSPITAL_COMMUNITY)
Admission: RE | Admit: 2019-01-23 | Discharge: 2019-01-23 | Disposition: A | Discharge status: Home / Self Care | Payer: No Typology Code available for payment source | Source: Ambulatory Visit | Attending: Orthopedic Surgery | Admitting: Orthopedic Surgery

## 2019-01-23 ENCOUNTER — Other Ambulatory Visit: Payer: Self-pay

## 2019-01-23 ENCOUNTER — Encounter (HOSPITAL_COMMUNITY): Payer: Self-pay

## 2019-01-23 ENCOUNTER — Ambulatory Visit: Payer: Self-pay | Admitting: Mental Health

## 2019-01-23 DIAGNOSIS — Z01812 Encounter for preprocedural laboratory examination: Secondary | ICD-10-CM | POA: Insufficient documentation

## 2019-01-23 LAB — CBC
HCT: 41.1 % (ref 36.0–46.0)
Hemoglobin: 13 g/dL (ref 12.0–15.0)
MCH: 29.9 pg (ref 26.0–34.0)
MCHC: 31.6 g/dL (ref 30.0–36.0)
MCV: 94.5 fL (ref 80.0–100.0)
Platelets: 301 10*3/uL (ref 150–400)
RBC: 4.35 MIL/uL (ref 3.87–5.11)
RDW: 13.2 % (ref 11.5–15.5)
WBC: 6.8 10*3/uL (ref 4.0–10.5)
nRBC: 0 % (ref 0.0–0.2)

## 2019-01-23 LAB — URINALYSIS, ROUTINE W REFLEX MICROSCOPIC
Bilirubin Urine: NEGATIVE
Glucose, UA: NEGATIVE mg/dL
Hgb urine dipstick: NEGATIVE
Ketones, ur: NEGATIVE mg/dL
Leukocytes,Ua: NEGATIVE
Nitrite: NEGATIVE
Protein, ur: NEGATIVE mg/dL
Specific Gravity, Urine: 1.004 — ABNORMAL LOW (ref 1.005–1.030)
pH: 5 (ref 5.0–8.0)

## 2019-01-23 LAB — BASIC METABOLIC PANEL
Anion gap: 8 (ref 5–15)
BUN: 13 mg/dL (ref 6–20)
CO2: 21 mmol/L — ABNORMAL LOW (ref 22–32)
Calcium: 8.8 mg/dL — ABNORMAL LOW (ref 8.9–10.3)
Chloride: 107 mmol/L (ref 98–111)
Creatinine, Ser: 0.72 mg/dL (ref 0.44–1.00)
GFR calc Af Amer: >60 mL/min (ref >60)
GFR calc non Af Amer: >60 mL/min (ref >60)
Glucose, Bld: 86 mg/dL (ref 70–99)
Potassium: 3.6 mmol/L (ref 3.5–5.1)
Sodium: 136 mmol/L (ref 135–145)

## 2019-01-23 LAB — TYPE AND SCREEN
ABO/RH(D): AB POS
Antibody Screen: NEGATIVE

## 2019-01-23 LAB — SURGICAL PCR SCREEN
MRSA, PCR: NEGATIVE
Staphylococcus aureus: NEGATIVE

## 2019-01-23 LAB — PROTIME-INR
INR: 0.9 (ref 0.8–1.2)
Prothrombin Time: 12.5 seconds (ref 11.4–15.2)

## 2019-01-23 NOTE — Progress Notes (Signed)

## 2019-01-23 NOTE — Progress Notes (Signed)
PCP - Clent Ridges  Cardiologist - Denies  Chest x-ray - 12/12/18(Epic)  EKG - NA  Stress Test - Denies  ECHO - Denies  Cardiac Cath - denies  AICD-denies PM-denies LOOP-denies  Sleep Study - denies CPAP - NA  LABS-CBC,BMP,PT/INR  ASA-denies  HA1C-NA Fasting Blood Sugar -  Checks Blood Sugar __0___ times a day  Anesthesia-Y. Consult order  Pt denies having chest pain, sob, or fever at this time. All instructions explained to the pt, with a verbal understanding of the material. Pt agrees to go over the instructions while at home for a better understanding. The opportunity to ask questions was provided.

## 2019-01-24 NOTE — Progress Notes (Signed)
Anesthesia Chart Review:  Case:  [id number] Date/Time:  01/30/19 0815   Procedures:      Anterior Lumbar Interbody Fusion L5-S1 (N/A ) - 180min     ABDOMINAL EXPOSURE (N/A )   Anesthesia type:  General   Pre-op diagnosis:  degenerative disc disease L5-S1   [facility] RO[facility] 03 / [facility]   Surgeon:  Venita LickBrooks, Dahari, MD; Nada LibmanBrabham, Vance W, MD      DISCUSSION: Patient is a 53 year old female scheduled for the above procedure.  History includes never smoker, asthma, anxiety.  I called and spoke with patient. She has known issues with allergies from pollen that if not managed early on tend to progress to bronchitis or sinusitis. She had a work related injury in 04/2018 and has been waiting for surgery for several months now and is in quite a bit of pain when moving. In early April (01/02/19 was office visit), she felt like her allergies were progressing into sinusitis and wanted aggressive treatment in hopes not to delay her surgery. She was prescribed Augmentin and steroid taper which she has completed. Symptoms completely resolved, so she did not require additional antibiotic that were mentioned on 01/16/19. She continues to deny fever, cough, SOB. She lives alone and has barely been out of the house since shelter-in-place COVID restrictions began. No known COVID exposures. Negative Coronavirus Screen. She has maintenence asthma/allergic rhinitis medications that she primarily uses as needed. She denied any known cardiac history or diabetes. She has had ~ 6 surgeries without known anesthesia complications. Patient sounds very well on the phone--no conversational dyspnea, laryngitis, coughing, sneezing, wheezing noted. Based on currently available information, I would anticipate that she can proceed as planned if no new changes.   VS: BP 116/68   Pulse 75   Temp 36.7 C (Oral)   Ht 5\' 7"  (1.702 m)   Wt 59.3 kg   LMP 02/19/2009   SpO2 100%   BMI 20.49 kg/m     PROVIDERS: Nelwyn SalisburyFry, Stephen A, MD is PCP     LABS: Labs reviewed: Acceptable for surgery. (all labs ordered are listed, but only abnormal results are displayed)  Labs Reviewed  BASIC METABOLIC PANEL - Abnormal; Notable for the following components:      Result Value   CO2 21 (*)    Calcium 8.8 (*)    All other components within normal limits  URINALYSIS, ROUTINE W REFLEX MICROSCOPIC - Abnormal; Notable for the following components:   Color, Urine STRAW (*)    Specific Gravity, Urine 1.004 (*)    All other components within normal limits  SURGICAL PCR SCREEN  CBC  PROTIME-INR  TYPE AND SCREEN    IMAGES: CXR 12/12/18: FINDINGS: The heart size and mediastinal contours are within normal limits. Both lungs are clear. The visualized skeletal structures are unremarkable. IMPRESSION: Normal exam.   EKG: N/A   CV: N/A  Past Medical History:  Diagnosis Date  . Anxiety   . Asthma    seasonal  . Pelvic pain     Past Surgical History:  Procedure Laterality Date  . CERVICAL BIOPSY  W/ LOOP ELECTRODE EXCISION     NOV 2014  . CHOLECYSTECTOMY    . LAPAROSCOPIC SALPINGO OOPHERECTOMY Bilateral 03/16/2017   Procedure: LAPAROSCOPIC SALPINGO OOPHORECTOMY;  Surgeon: [name], Timothy P, MD;  Location: [facility];  Service: Gynecology;  Laterality: Bilateral;  . OPEN REDUCTION INTERNAL FIXATION (ORIF) DISTAL RADIAL FRACTURE Right 05/02/2018   Procedure: OPEN REDUCTION INTERNAL  FIXATION (ORIF) DISTAL RADIAL FRACTURE;  Surgeon: Bradly Bienenstock, MD;  Location: [facility] OR;  Service: Orthopedics;  Laterality: Right;  . PELVIC LAPAROSCOPY      MEDICATIONS: . acetaminophen (TYLENOL) 500 MG tablet  . ALPRAZolam (XANAX) 1 MG tablet  . amoxicillin-clavulanate (AUGMENTIN) 875-125 MG tablet  . fluticasone (FLONASE) 50 MCG/ACT nasal spray  . Fluticasone Propionate, Inhal, (FLOVENT DISKUS) 100 MCG/BLIST AEPB  . ibuprofen (ADVIL,MOTRIN) 800 MG tablet  . Melatonin 3 MG CAPS  . montelukast (SINGULAIR) 10 MG tablet  . Multiple  Vitamin (MULTIVITAMIN) tablet  . PROAIR HFA 108 (90 Base) MCG/ACT inhaler  . rizatriptan (MAXALT-MLT) 10 MG disintegrating tablet  . traMADol (ULTRAM) 50 MG tablet  . valACYclovir (VALTREX) 500 MG tablet   No current facility-administered medications for this encounter.   Patient is no longer on Augmentin. Vitamins on hold.   Shonna Chock, PA-C Surgical Short Stay/Anesthesiology [facility] Phone [phone] [facility] Phone [phone] 01/25/2019 10:45 AM

## 2019-01-25 NOTE — Anesthesia Preprocedure Evaluation (Addendum)
Anesthesia Evaluation  Patient identified by MRN, date of birth, ID band Patient awake    Reviewed: Allergy & Precautions, H&P , NPO status , Patient's Chart, lab work & pertinent test results  Airway Mallampati: II   Neck ROM: full    Dental   Pulmonary asthma ,    breath sounds clear to auscultation       Cardiovascular negative cardio ROS   Rhythm:regular Rate:Normal     Neuro/Psych PSYCHIATRIC DISORDERS Anxiety Depression    GI/Hepatic   Endo/Other    Renal/GU      Musculoskeletal   Abdominal   Peds  Hematology   Anesthesia Other Findings   Reproductive/Obstetrics                             Anesthesia Physical Anesthesia Plan  ASA: II  Anesthesia Plan: General   Post-op Pain Management:    Induction: Intravenous  PONV Risk Score and Plan: 3 and Ondansetron, Dexamethasone, Midazolam and Treatment may vary due to age or medical condition  Airway Management Planned: Oral ETT  Additional Equipment:   Intra-op Plan:   Post-operative Plan: Extubation in OR  Informed Consent: I have reviewed the patients History and Physical, chart, labs and discussed the procedure including the risks, benefits and alternatives for the proposed anesthesia with the patient or authorized representative who has indicated his/her understanding and acceptance.       Plan Discussed with: CRNA, Anesthesiologist and Surgeon  Anesthesia Plan Comments: (PAT note written 01/25/2019 by Shonna Chock, PA-C. )       Anesthesia Quick Evaluation

## 2019-01-30 ENCOUNTER — Other Ambulatory Visit: Payer: Self-pay

## 2019-01-30 ENCOUNTER — Inpatient Hospital Stay (HOSPITAL_COMMUNITY): Payer: No Typology Code available for payment source

## 2019-01-30 ENCOUNTER — Encounter (HOSPITAL_COMMUNITY)
Admission: RE | Disposition: A | Discharge status: Home / Self Care | Payer: Self-pay | Source: Home / Self Care | Attending: Orthopedic Surgery

## 2019-01-30 ENCOUNTER — Encounter (HOSPITAL_COMMUNITY): Payer: Self-pay | Admitting: *Deleted

## 2019-01-30 ENCOUNTER — Inpatient Hospital Stay (HOSPITAL_COMMUNITY)
Admission: RE | Admit: 2019-01-30 | Discharge: 2019-01-31 | DRG: 459 | Disposition: A | Discharge status: Home / Self Care | Payer: No Typology Code available for payment source | Attending: Orthopedic Surgery | Admitting: Orthopedic Surgery

## 2019-01-30 ENCOUNTER — Inpatient Hospital Stay (HOSPITAL_COMMUNITY): Payer: No Typology Code available for payment source | Admitting: Vascular Surgery

## 2019-01-30 ENCOUNTER — Inpatient Hospital Stay (HOSPITAL_COMMUNITY): Payer: No Typology Code available for payment source | Admitting: Certified Registered Nurse Anesthetist

## 2019-01-30 DIAGNOSIS — R140 Abdominal distension (gaseous): Secondary | ICD-10-CM | POA: Diagnosis not present

## 2019-01-30 DIAGNOSIS — Z8249 Family history of ischemic heart disease and other diseases of the circulatory system: Secondary | ICD-10-CM | POA: Diagnosis not present

## 2019-01-30 DIAGNOSIS — Y838 Other surgical procedures as the cause of abnormal reaction of the patient, or of later complication, without mention of misadventure at the time of the procedure: Secondary | ICD-10-CM | POA: Diagnosis not present

## 2019-01-30 DIAGNOSIS — M545 Low back pain, unspecified: Secondary | ICD-10-CM | POA: Diagnosis present

## 2019-01-30 DIAGNOSIS — T1490XS Injury, unspecified, sequela: Secondary | ICD-10-CM

## 2019-01-30 DIAGNOSIS — Z803 Family history of malignant neoplasm of breast: Secondary | ICD-10-CM | POA: Diagnosis not present

## 2019-01-30 DIAGNOSIS — Y92234 Operating room of hospital as the place of occurrence of the external cause: Secondary | ICD-10-CM | POA: Diagnosis not present

## 2019-01-30 DIAGNOSIS — F419 Anxiety disorder, unspecified: Secondary | ICD-10-CM | POA: Diagnosis present

## 2019-01-30 DIAGNOSIS — X58XXXS Exposure to other specified factors, sequela: Secondary | ICD-10-CM | POA: Diagnosis present

## 2019-01-30 DIAGNOSIS — Z90711 Acquired absence of uterus with remaining cervical stump: Secondary | ICD-10-CM | POA: Diagnosis not present

## 2019-01-30 DIAGNOSIS — I9752 Accidental puncture and laceration of a circulatory system organ or structure during other procedure: Secondary | ICD-10-CM | POA: Diagnosis not present

## 2019-01-30 DIAGNOSIS — M5136 Other intervertebral disc degeneration, lumbar region: Secondary | ICD-10-CM | POA: Diagnosis not present

## 2019-01-30 DIAGNOSIS — Z79899 Other long term (current) drug therapy: Secondary | ICD-10-CM | POA: Diagnosis not present

## 2019-01-30 DIAGNOSIS — S35515A Injury of left iliac vein, initial encounter: Secondary | ICD-10-CM | POA: Diagnosis not present

## 2019-01-30 DIAGNOSIS — M62838 Other muscle spasm: Secondary | ICD-10-CM | POA: Diagnosis not present

## 2019-01-30 DIAGNOSIS — J45909 Unspecified asthma, uncomplicated: Secondary | ICD-10-CM | POA: Diagnosis present

## 2019-01-30 DIAGNOSIS — Z419 Encounter for procedure for purposes other than remedying health state, unspecified: Secondary | ICD-10-CM

## 2019-01-30 DIAGNOSIS — K5900 Constipation, unspecified: Secondary | ICD-10-CM | POA: Diagnosis present

## 2019-01-30 DIAGNOSIS — R109 Unspecified abdominal pain: Secondary | ICD-10-CM

## 2019-01-30 DIAGNOSIS — M5117 Intervertebral disc disorders with radiculopathy, lumbosacral region: Principal | ICD-10-CM | POA: Diagnosis present

## 2019-01-30 DIAGNOSIS — I82409 Acute embolism and thrombosis of unspecified deep veins of unspecified lower extremity: Secondary | ICD-10-CM | POA: Diagnosis not present

## 2019-01-30 DIAGNOSIS — Z7951 Long term (current) use of inhaled steroids: Secondary | ICD-10-CM

## 2019-01-30 HISTORY — PX: ANTERIOR LUMBAR FUSION: SHX1170

## 2019-01-30 HISTORY — PX: ABDOMINAL EXPOSURE: SHX5708

## 2019-01-30 HISTORY — DX: Headache, unspecified: R51.9

## 2019-01-30 HISTORY — DX: Headache: R51

## 2019-01-30 LAB — CBC
HCT: 36.8 % (ref 36.0–46.0)
Hemoglobin: 12.2 g/dL (ref 12.0–15.0)
MCH: 30 pg (ref 26.0–34.0)
MCHC: 33.2 g/dL (ref 30.0–36.0)
MCV: 90.6 fL (ref 80.0–100.0)
Platelets: 229 10*3/uL (ref 150–400)
RBC: 4.06 MIL/uL (ref 3.87–5.11)
RDW: 13.2 % (ref 11.5–15.5)
WBC: 12.6 10*3/uL — ABNORMAL HIGH (ref 4.0–10.5)
nRBC: 0 % (ref 0.0–0.2)

## 2019-01-30 LAB — CREATININE, SERUM
Creatinine, Ser: 0.83 mg/dL (ref 0.44–1.00)
GFR calc Af Amer: >60 mL/min (ref >60)
GFR calc non Af Amer: >60 mL/min (ref >60)

## 2019-01-30 SURGERY — ANTERIOR LUMBAR FUSION 1 LEVEL
Anesthesia: General | Site: Back

## 2019-01-30 MED ORDER — ONDANSETRON HCL 4 MG/2ML IJ SOLN
4.0000 mg | Freq: Four times a day (QID) | INTRAMUSCULAR | Status: DC | PRN
  Administered 2019-01-30 – 2019-01-31 (×2): 4 mg via INTRAVENOUS
  Filled 2019-01-30 (×2): qty 2

## 2019-01-30 MED ORDER — FENTANYL CITRATE (PF) 100 MCG/2ML IJ SOLN
INTRAMUSCULAR | Status: AC
  Administered 2019-01-30: 08:00:00 50 ug via INTRAVENOUS
  Filled 2019-01-30: qty 2

## 2019-01-30 MED ORDER — METHOCARBAMOL 500 MG PO TABS: 500.0000 mg | ORAL_TABLET | Freq: Three times a day (TID) | ORAL | 0 refills | Status: DC | PRN

## 2019-01-30 MED ORDER — DEXAMETHASONE SODIUM PHOSPHATE 10 MG/ML IJ SOLN
INTRAMUSCULAR | Status: DC | PRN
  Administered 2019-01-30: 10 mg via INTRAVENOUS

## 2019-01-30 MED ORDER — FENTANYL CITRATE (PF) 100 MCG/2ML IJ SOLN: 25.0000 ug | INTRAMUSCULAR | Status: DC | PRN

## 2019-01-30 MED ORDER — LACTATED RINGERS IV SOLN
INTRAVENOUS | Status: DC | PRN
  Administered 2019-01-30: 09:00:00 via INTRAVENOUS

## 2019-01-30 MED ORDER — PHENYLEPHRINE HCL (PRESSORS) 10 MG/ML IV SOLN
INTRAVENOUS | Status: DC | PRN
  Administered 2019-01-30 (×3): 60 ug via INTRAVENOUS

## 2019-01-30 MED ORDER — THROMBIN 20000 UNITS EX SOLR
CUTANEOUS | Status: DC | PRN
  Administered 2019-01-30: 10:00:00

## 2019-01-30 MED ORDER — EPHEDRINE 5 MG/ML INJ
INTRAVENOUS | Status: AC
  Filled 2019-01-30: qty 10

## 2019-01-30 MED ORDER — CHLORHEXIDINE GLUCONATE 4 % EX LIQD: 60.0000 mL | Freq: Once | CUTANEOUS | Status: DC

## 2019-01-30 MED ORDER — DEXAMETHASONE 4 MG PO TABS: 4.0000 mg | ORAL_TABLET | Freq: Four times a day (QID) | ORAL | Status: DC

## 2019-01-30 MED ORDER — HEMOSTATIC AGENTS (NO CHARGE) OPTIME
TOPICAL | Status: DC | PRN
  Administered 2019-01-30: 1 via TOPICAL

## 2019-01-30 MED ORDER — ROCURONIUM BROMIDE 100 MG/10ML IV SOLN
INTRAVENOUS | Status: DC | PRN
  Administered 2019-01-30: 50 mg via INTRAVENOUS
  Administered 2019-01-30: 30 mg via INTRAVENOUS
  Administered 2019-01-30: 20 mg via INTRAVENOUS

## 2019-01-30 MED ORDER — LACTATED RINGERS IV SOLN
INTRAVENOUS | Status: DC
  Administered 2019-01-30 – 2019-01-31 (×2): via INTRAVENOUS

## 2019-01-30 MED ORDER — ROCURONIUM BROMIDE 10 MG/ML (PF) SYRINGE
PREFILLED_SYRINGE | INTRAVENOUS | Status: AC
  Filled 2019-01-30: qty 10

## 2019-01-30 MED ORDER — LACTATED RINGERS IV SOLN
INTRAVENOUS | Status: DC | PRN
  Administered 2019-01-30 (×2): via INTRAVENOUS

## 2019-01-30 MED ORDER — DEXAMETHASONE SODIUM PHOSPHATE 4 MG/ML IJ SOLN: 4.0000 mg | Freq: Four times a day (QID) | INTRAMUSCULAR | Status: DC

## 2019-01-30 MED ORDER — FLEET ENEMA 7-19 GM/118ML RE ENEM
1.0000 | ENEMA | Freq: Once | RECTAL | Status: DC
  Filled 2019-01-30: qty 1

## 2019-01-30 MED ORDER — ONDANSETRON HCL 4 MG PO TABS: 4.0000 mg | ORAL_TABLET | Freq: Three times a day (TID) | ORAL | 0 refills | Status: DC | PRN

## 2019-01-30 MED ORDER — FENTANYL CITRATE (PF) 250 MCG/5ML IJ SOLN
INTRAMUSCULAR | Status: DC | PRN
  Administered 2019-01-30: 150 ug via INTRAVENOUS
  Administered 2019-01-30: 50 ug via INTRAVENOUS

## 2019-01-30 MED ORDER — SODIUM CHLORIDE 0.9 % IV SOLN: 250.0000 mL | INTRAVENOUS | Status: DC

## 2019-01-30 MED ORDER — EPHEDRINE SULFATE 50 MG/ML IJ SOLN
INTRAMUSCULAR | Status: DC | PRN
  Administered 2019-01-30: 5 mg via INTRAVENOUS

## 2019-01-30 MED ORDER — TRANEXAMIC ACID-NACL 1000-0.7 MG/100ML-% IV SOLN
INTRAVENOUS | Status: AC
  Filled 2019-01-30: qty 100

## 2019-01-30 MED ORDER — 0.9 % SODIUM CHLORIDE (POUR BTL) OPTIME
TOPICAL | Status: DC | PRN
  Administered 2019-01-30 (×2): 1000 mL

## 2019-01-30 MED ORDER — THROMBIN (RECOMBINANT) 20000 UNITS EX SOLR
CUTANEOUS | Status: AC
  Filled 2019-01-30: qty 20000

## 2019-01-30 MED ORDER — BUPIVACAINE LIPOSOME 1.3 % IJ SUSP
INTRAMUSCULAR | Status: DC | PRN
  Administered 2019-01-30 (×2): 10 mL via PERINEURAL

## 2019-01-30 MED ORDER — LIDOCAINE HCL (CARDIAC) PF 100 MG/5ML IV SOSY
PREFILLED_SYRINGE | INTRAVENOUS | Status: DC | PRN
  Administered 2019-01-30: 50 mg via INTRAVENOUS

## 2019-01-30 MED ORDER — DIAZEPAM 5 MG PO TABS
5.0000 mg | ORAL_TABLET | Freq: Three times a day (TID) | ORAL | Status: DC | PRN
  Administered 2019-01-30 – 2019-01-31 (×3): 5 mg via ORAL
  Filled 2019-01-30 (×3): qty 1

## 2019-01-30 MED ORDER — FENTANYL CITRATE (PF) 100 MCG/2ML IJ SOLN
50.0000 ug | Freq: Once | INTRAMUSCULAR | Status: AC
  Administered 2019-01-30: 08:00:00 50 ug via INTRAVENOUS

## 2019-01-30 MED ORDER — CEFAZOLIN SODIUM-DEXTROSE 2-4 GM/100ML-% IV SOLN
INTRAVENOUS | Status: AC
  Filled 2019-01-30: qty 100

## 2019-01-30 MED ORDER — TRANEXAMIC ACID-NACL 1000-0.7 MG/100ML-% IV SOLN
1000.0000 mg | INTRAVENOUS | Status: AC
  Administered 2019-01-30: 1000 mg via INTRAVENOUS

## 2019-01-30 MED ORDER — SUGAMMADEX SODIUM 200 MG/2ML IV SOLN
INTRAVENOUS | Status: DC | PRN
  Administered 2019-01-30: 125 mg via INTRAVENOUS

## 2019-01-30 MED ORDER — BUPIVACAINE HCL (PF) 0.5 % IJ SOLN
INTRAMUSCULAR | Status: DC | PRN
  Administered 2019-01-30 (×2): 10 mL via PERINEURAL

## 2019-01-30 MED ORDER — MIDAZOLAM HCL 2 MG/2ML IJ SOLN
INTRAMUSCULAR | Status: AC
  Filled 2019-01-30: qty 2

## 2019-01-30 MED ORDER — METHOCARBAMOL 1000 MG/10ML IJ SOLN
500.0000 mg | Freq: Four times a day (QID) | INTRAVENOUS | Status: DC | PRN
  Filled 2019-01-30: qty 5

## 2019-01-30 MED ORDER — SODIUM CHLORIDE 0.9% FLUSH
3.0000 mL | Freq: Two times a day (BID) | INTRAVENOUS | Status: DC
  Administered 2019-01-30: 22:00:00 3 mL via INTRAVENOUS

## 2019-01-30 MED ORDER — PHENOL 1.4 % MT LIQD: 1.0000 | OROMUCOSAL | Status: DC | PRN

## 2019-01-30 MED ORDER — LIDOCAINE 2% (20 MG/ML) 5 ML SYRINGE
INTRAMUSCULAR | Status: AC
  Filled 2019-01-30: qty 5

## 2019-01-30 MED ORDER — ENOXAPARIN SODIUM 40 MG/0.4ML ~~LOC~~ SOLN: 40.0000 mg | SUBCUTANEOUS | Status: DC

## 2019-01-30 MED ORDER — OXYCODONE HCL 5 MG PO TABS: 5.0000 mg | ORAL_TABLET | Freq: Once | ORAL | Status: DC | PRN

## 2019-01-30 MED ORDER — ACETAMINOPHEN 325 MG PO TABS
650.0000 mg | ORAL_TABLET | ORAL | Status: DC | PRN
  Administered 2019-01-30 – 2019-01-31 (×3): 650 mg via ORAL
  Filled 2019-01-30 (×3): qty 2

## 2019-01-30 MED ORDER — MIDAZOLAM HCL 2 MG/2ML IJ SOLN
2.0000 mg | Freq: Once | INTRAMUSCULAR | Status: AC
  Administered 2019-01-30: 08:00:00 2 mg via INTRAVENOUS

## 2019-01-30 MED ORDER — ENOXAPARIN SODIUM 40 MG/0.4ML ~~LOC~~ SOLN: 40.0000 mg | SUBCUTANEOUS | 0 refills | Status: DC

## 2019-01-30 MED ORDER — BUDESONIDE 0.25 MG/2ML IN SUSP: 0.2500 mg | Freq: Two times a day (BID) | RESPIRATORY_TRACT | Status: DC

## 2019-01-30 MED ORDER — FENTANYL CITRATE (PF) 250 MCG/5ML IJ SOLN
INTRAMUSCULAR | Status: AC
  Filled 2019-01-30: qty 5

## 2019-01-30 MED ORDER — OXYCODONE-ACETAMINOPHEN 5-325 MG PO TABS
1.0000 | ORAL_TABLET | Freq: Four times a day (QID) | ORAL | Status: DC | PRN
  Administered 2019-01-30: 16:00:00 2 via ORAL
  Administered 2019-01-31: 1 via ORAL
  Filled 2019-01-30 (×3): qty 2

## 2019-01-30 MED ORDER — PHENYLEPHRINE 40 MCG/ML (10ML) SYRINGE FOR IV PUSH (FOR BLOOD PRESSURE SUPPORT)
PREFILLED_SYRINGE | INTRAVENOUS | Status: AC
  Filled 2019-01-30: qty 10

## 2019-01-30 MED ORDER — METHOCARBAMOL 500 MG PO TABS
500.0000 mg | ORAL_TABLET | Freq: Four times a day (QID) | ORAL | Status: DC | PRN
  Administered 2019-01-30: 15:00:00 500 mg via ORAL
  Filled 2019-01-30: qty 1

## 2019-01-30 MED ORDER — SUCCINYLCHOLINE CHLORIDE 200 MG/10ML IV SOSY
PREFILLED_SYRINGE | INTRAVENOUS | Status: AC
  Filled 2019-01-30: qty 10

## 2019-01-30 MED ORDER — MENTHOL 3 MG MT LOZG: 1.0000 | LOZENGE | OROMUCOSAL | Status: DC | PRN

## 2019-01-30 MED ORDER — DEXAMETHASONE SODIUM PHOSPHATE 10 MG/ML IJ SOLN
INTRAMUSCULAR | Status: AC
  Filled 2019-01-30: qty 2

## 2019-01-30 MED ORDER — ONDANSETRON HCL 4 MG/2ML IJ SOLN
INTRAMUSCULAR | Status: DC | PRN
  Administered 2019-01-30: 4 mg via INTRAVENOUS

## 2019-01-30 MED ORDER — CEFAZOLIN SODIUM-DEXTROSE 1-4 GM/50ML-% IV SOLN
1.0000 g | Freq: Three times a day (TID) | INTRAVENOUS | Status: AC
  Administered 2019-01-30 – 2019-01-31 (×2): 1 g via INTRAVENOUS
  Filled 2019-01-30 (×2): qty 50

## 2019-01-30 MED ORDER — ONDANSETRON HCL 4 MG PO TABS: 4.0000 mg | ORAL_TABLET | Freq: Four times a day (QID) | ORAL | Status: DC | PRN

## 2019-01-30 MED ORDER — MAGNESIUM CITRATE PO SOLN
0.5000 | Freq: Once | ORAL | Status: AC
  Administered 2019-01-30: 20:00:00 0.5 via ORAL
  Filled 2019-01-30: qty 296

## 2019-01-30 MED ORDER — OXYCODONE-ACETAMINOPHEN 10-325 MG PO TABS: 1.0000 | ORAL_TABLET | Freq: Four times a day (QID) | ORAL | 0 refills | Status: DC | PRN

## 2019-01-30 MED ORDER — ONDANSETRON HCL 4 MG/2ML IJ SOLN
4.0000 mg | Freq: Four times a day (QID) | INTRAMUSCULAR | Status: AC | PRN
  Administered 2019-01-30: 4 mg via INTRAVENOUS

## 2019-01-30 MED ORDER — SUCCINYLCHOLINE CHLORIDE 20 MG/ML IJ SOLN
INTRAMUSCULAR | Status: DC | PRN
  Administered 2019-01-30: 100 mg via INTRAVENOUS

## 2019-01-30 MED ORDER — ONDANSETRON HCL 4 MG/2ML IJ SOLN
INTRAMUSCULAR | Status: AC
  Filled 2019-01-30: qty 2

## 2019-01-30 MED ORDER — CEFAZOLIN SODIUM-DEXTROSE 2-4 GM/100ML-% IV SOLN
2.0000 g | INTRAVENOUS | Status: AC
  Administered 2019-01-30: 2 g via INTRAVENOUS

## 2019-01-30 MED ORDER — OXYCODONE HCL 5 MG PO TABS: 10.0000 mg | ORAL_TABLET | ORAL | Status: DC | PRN

## 2019-01-30 MED ORDER — PROPOFOL 10 MG/ML IV BOLUS
INTRAVENOUS | Status: AC
  Filled 2019-01-30: qty 20

## 2019-01-30 MED ORDER — OXYCODONE HCL 5 MG PO TABS: 5.0000 mg | ORAL_TABLET | ORAL | Status: DC | PRN

## 2019-01-30 MED ORDER — ACETAMINOPHEN 650 MG RE SUPP: 650.0000 mg | RECTAL | Status: DC | PRN

## 2019-01-30 MED ORDER — MIDAZOLAM HCL 2 MG/2ML IJ SOLN
INTRAMUSCULAR | Status: AC
  Administered 2019-01-30: 08:00:00 2 mg via INTRAVENOUS
  Filled 2019-01-30: qty 2

## 2019-01-30 MED ORDER — ALBUTEROL SULFATE HFA 108 (90 BASE) MCG/ACT IN AERS: 2.0000 | INHALATION_SPRAY | Freq: Four times a day (QID) | RESPIRATORY_TRACT | Status: DC | PRN

## 2019-01-30 MED ORDER — OXYCODONE HCL 5 MG/5ML PO SOLN: 5.0000 mg | Freq: Once | ORAL | Status: DC | PRN

## 2019-01-30 MED ORDER — PROPOFOL 10 MG/ML IV BOLUS
INTRAVENOUS | Status: DC | PRN
  Administered 2019-01-30: 110 mg via INTRAVENOUS

## 2019-01-30 MED ORDER — BUPIVACAINE-EPINEPHRINE (PF) 0.25% -1:200000 IJ SOLN
INTRAMUSCULAR | Status: AC
  Filled 2019-01-30: qty 30

## 2019-01-30 MED ORDER — BUPIVACAINE-EPINEPHRINE 0.25% -1:200000 IJ SOLN: INTRAMUSCULAR | Status: DC | PRN

## 2019-01-30 MED ORDER — SODIUM CHLORIDE 0.9% FLUSH: 3.0000 mL | INTRAVENOUS | Status: DC | PRN

## 2019-01-30 SURGICAL SUPPLY — 111 items
ADH SKN CLS APL DERMABOND .7 (GAUZE/BANDAGES/DRESSINGS) ×2
AGENT HMST KT MTR STRL THRMB (HEMOSTASIS) ×2
APPLIER CLIP 11 MED OPEN (CLIP) ×3
APR CLP MED 11 20 MLT OPN (CLIP) ×2
BLADE CLIPPER SURG (BLADE) ×1 IMPLANT
BLADE SURG 10 STRL SS (BLADE) ×3 IMPLANT
BONE VIVIGEN FORMABLE 5.4CC (Bone Implant) ×3 IMPLANT
CABLE BIPOLOR RESECTION CORD (MISCELLANEOUS) ×3 IMPLANT
CATH FOLEY 2WAY SLVR  5CC 16FR (CATHETERS) ×1
CATH FOLEY 2WAY SLVR 5CC 16FR (CATHETERS) ×2 IMPLANT
CLIP APPLIE 11 MED OPEN (CLIP) ×4 IMPLANT
CLIP VESOCCLUDE MED 24/CT (CLIP) ×3 IMPLANT
CLIP VESOCCLUDE SM WIDE 24/CT (CLIP) ×3 IMPLANT
CLSR STERI-STRIP ANTIMIC 1/2X4 (GAUZE/BANDAGES/DRESSINGS) ×1 IMPLANT
COVER BACK TABLE 60X90IN (DRAPES) ×3 IMPLANT
COVER SURGICAL LIGHT HANDLE (MISCELLANEOUS) ×3 IMPLANT
COVER WAND RF STERILE (DRAPES) ×6 IMPLANT
DERMABOND ADVANCED (GAUZE/BANDAGES/DRESSINGS) ×1
DERMABOND ADVANCED .7 DNX12 (GAUZE/BANDAGES/DRESSINGS) ×2 IMPLANT
DRAPE C-ARM 42X72 X-RAY (DRAPES) ×5 IMPLANT
DRAPE C-ARMOR (DRAPES) ×2 IMPLANT
DRAPE INCISE IOBAN 66X45 STRL (DRAPES) ×3 IMPLANT
DRAPE POUCH INSTRU U-SHP 10X18 (DRAPES) ×3 IMPLANT
DRAPE SURG 17X23 STRL (DRAPES) ×3 IMPLANT
DRAPE U-SHAPE 47X51 STRL (DRAPES) ×3 IMPLANT
DRSG OPSITE POSTOP 4X6 (GAUZE/BANDAGES/DRESSINGS) ×1 IMPLANT
DRSG OPSITE POSTOP 4X8 (GAUZE/BANDAGES/DRESSINGS) ×3 IMPLANT
DURAPREP 26ML APPLICATOR (WOUND CARE) ×3 IMPLANT
ELECT BLADE 4.0 EZ CLEAN MEGAD (MISCELLANEOUS) ×3
ELECT CAUTERY BLADE 6.4 (BLADE) ×3 IMPLANT
ELECT PENCIL ROCKER SW 15FT (MISCELLANEOUS) ×3 IMPLANT
ELECT REM PT RETURN 9FT ADLT (ELECTROSURGICAL) ×3
ELECTRODE BLDE 4.0 EZ CLN MEGD (MISCELLANEOUS) ×2 IMPLANT
ELECTRODE REM PT RTRN 9FT ADLT (ELECTROSURGICAL) ×2 IMPLANT
FELT TEFLON 1X6 (MISCELLANEOUS) ×1 IMPLANT
FLOSEAL 10ML (HEMOSTASIS) ×3 IMPLANT
GAUZE 4X4 16PLY RFD (DISPOSABLE) IMPLANT
GLOVE BIO SURGEON STRL SZ 6.5 (GLOVE) ×3 IMPLANT
GLOVE BIO SURGEON STRL SZ7.5 (GLOVE) IMPLANT
GLOVE BIOGEL PI IND STRL 6.5 (GLOVE) ×2 IMPLANT
GLOVE BIOGEL PI IND STRL 7.5 (GLOVE) ×2 IMPLANT
GLOVE BIOGEL PI IND STRL 8.5 (GLOVE) ×4 IMPLANT
GLOVE BIOGEL PI INDICATOR 6.5 (GLOVE) ×1
GLOVE BIOGEL PI INDICATOR 7.5 (GLOVE) ×1
GLOVE BIOGEL PI INDICATOR 8.5 (GLOVE) ×2
GLOVE SS BIOGEL STRL SZ 8.5 (GLOVE) ×4 IMPLANT
GLOVE SUPERSENSE BIOGEL SZ 8.5 (GLOVE) ×2
GLOVE SURG SS PI 7.5 STRL IVOR (GLOVE) ×3 IMPLANT
GOWN STRL REUS W/ TWL LRG LVL3 (GOWN DISPOSABLE) ×6 IMPLANT
GOWN STRL REUS W/ TWL XL LVL3 (GOWN DISPOSABLE) ×2 IMPLANT
GOWN STRL REUS W/TWL 2XL LVL3 (GOWN DISPOSABLE) ×9 IMPLANT
GOWN STRL REUS W/TWL LRG LVL3 (GOWN DISPOSABLE) ×9
GOWN STRL REUS W/TWL XL LVL3 (GOWN DISPOSABLE) ×3
GRAFT BNE MATRIX VG FRMBL MD 5 (Bone Implant) IMPLANT
HEMOSTAT SNOW SURGICEL 2X4 (HEMOSTASIS) IMPLANT
INSERT FOGARTY 61MM (MISCELLANEOUS) IMPLANT
INSERT FOGARTY SM (MISCELLANEOUS) IMPLANT
INTERPLATE 39X14X12 (Plate) ×3 IMPLANT
KIT BASIN OR (CUSTOM PROCEDURE TRAY) ×3 IMPLANT
KIT TURNOVER KIT B (KITS) ×6 IMPLANT
LOOP VESSEL MAXI BLUE (MISCELLANEOUS) ×3 IMPLANT
LOOP VESSEL MINI RED (MISCELLANEOUS) ×3 IMPLANT
NDL SPNL 18GX3.5 QUINCKE PK (NEEDLE) ×2 IMPLANT
NEEDLE SPNL 18GX3.5 QUINCKE PK (NEEDLE) ×3 IMPLANT
NS IRRIG 1000ML POUR BTL (IV SOLUTION) ×3 IMPLANT
PACK LAMINECTOMY ORTHO (CUSTOM PROCEDURE TRAY) ×3 IMPLANT
PACK UNIVERSAL I (CUSTOM PROCEDURE TRAY) ×3 IMPLANT
PAD ARMBOARD 7.5X6 YLW CONV (MISCELLANEOUS) ×10 IMPLANT
PEEK SPACER INTERPLAT 35X14X12 (Peek) ×1 IMPLANT
PLATE SPINAL INTERPLT 39X14X12 (Plate) IMPLANT
SCREW BONE RESCUE (Screw) ×3 IMPLANT
SCREW BONE STANDARD (Screw) ×3 IMPLANT
SCREW BONE STD (Screw) IMPLANT
SCREW SPINAL STD (Orthopedic Implant) ×1 IMPLANT
SPONGE INTESTINAL PEANUT (DISPOSABLE) ×6 IMPLANT
SPONGE LAP 18X18 RF (DISPOSABLE) IMPLANT
SPONGE LAP 4X18 RFD (DISPOSABLE) IMPLANT
SPONGE SURGIFOAM ABS GEL 100 (HEMOSTASIS) IMPLANT
SURGIFLO W/THROMBIN 8M KIT (HEMOSTASIS) ×1 IMPLANT
SUT BONE WAX W31G (SUTURE) ×3 IMPLANT
SUT MNCRL AB 3-0 PS2 18 (SUTURE) ×1 IMPLANT
SUT MNCRL AB 4-0 PS2 18 (SUTURE) ×3 IMPLANT
SUT MON AB 3-0 SH 27 (SUTURE) ×3
SUT MON AB 3-0 SH27 (SUTURE) ×2 IMPLANT
SUT PDS AB 1 CTX 36 (SUTURE) ×6 IMPLANT
SUT PROLENE 4 0 RB 1 (SUTURE) ×12
SUT PROLENE 4-0 RB1 .5 CRCL 36 (SUTURE) ×8 IMPLANT
SUT PROLENE 5 0 C 1 24 (SUTURE) IMPLANT
SUT PROLENE 5 0 CC1 (SUTURE) IMPLANT
SUT PROLENE 6 0 C 1 30 (SUTURE) ×3 IMPLANT
SUT PROLENE 6 0 CC (SUTURE) IMPLANT
SUT SILK 0 TIES 10X30 (SUTURE) ×3 IMPLANT
SUT SILK 2 0 TIES 10X30 (SUTURE) ×6 IMPLANT
SUT SILK 2 0SH CR/8 30 (SUTURE) IMPLANT
SUT SILK 3 0 TIES 10X30 (SUTURE) ×5 IMPLANT
SUT SILK 3 0SH CR/8 30 (SUTURE) IMPLANT
SUT VIC AB 0 CT1 27 (SUTURE) ×3
SUT VIC AB 0 CT1 27XBRD ANBCTR (SUTURE) ×2 IMPLANT
SUT VIC AB 1 CT1 27 (SUTURE) ×6
SUT VIC AB 1 CT1 27XBRD ANBCTR (SUTURE) ×4 IMPLANT
SUT VIC AB 2-0 CT1 18 (SUTURE) ×3 IMPLANT
SUT VIC AB 2-0 CT1 27 (SUTURE) ×6
SUT VIC AB 2-0 CT1 TAPERPNT 27 (SUTURE) ×2 IMPLANT
SUT VIC AB 3-0 SH 27 (SUTURE) ×3
SUT VIC AB 3-0 SH 27X BRD (SUTURE) ×2 IMPLANT
SYR BULB IRRIGATION 50ML (SYRINGE) ×3 IMPLANT
TOWEL GREEN STERILE (TOWEL DISPOSABLE) ×6 IMPLANT
TOWEL GREEN STERILE FF (TOWEL DISPOSABLE) ×3 IMPLANT
TRAP SPECIMEN MUCOUS 40CC (MISCELLANEOUS) ×3 IMPLANT
TRAY FOLEY MTR SLVR 16FR STAT (SET/KITS/TRAYS/PACK) ×1 IMPLANT
WATER STERILE IRR 1000ML POUR (IV SOLUTION) ×3 IMPLANT

## 2019-01-30 NOTE — Progress Notes (Signed)
    Subjective: Procedure(s) (LRB): Anterior Lumbar Interbody Fusion L5-S1 (N/A) ABDOMINAL EXPOSURE (N/A) Day of Surgery  Patient reports pain as 3 on 0-10 scale.  Reports decreased leg pain reports incisional back pain   N/A void -  Foley in place Negative bowel movement Negative flatus Negative chest pain or shortness of breath  Objective: Vital signs in last 24 hours: Temp:  [97.2 F (36.2 C)-98.5 F (36.9 C)] 98.5 F (36.9 C) (05/06 1645) Pulse Rate:  [57-96] 96 (05/06 1645) Resp:  [14-22] 22 (05/06 1645) BP: (107-135)/(43-82) 130/82 (05/06 1645) SpO2:  [93 %-100 %] 100 % (05/06 1645) Weight:  [58.1 kg] 58.1 kg (05/06 0649)  Intake/Output from previous day: No intake/output data recorded.  Labs: Recent Labs    01/30/19 1424  WBC 12.6*  RBC 4.06  HCT 36.8  PLT 229   Recent Labs    01/30/19 1424  CREATININE 0.83   No results for input(s): LABPT, INR in the last 72 hours.  Physical Exam: Neurologically intact ABD soft Neurovascular intact Intact pulses distally Incision: dressing C/D/I Compartment soft Body mass index is 20.05 kg/m.   Assessment/Plan: Patient stable  xrays :KUB - positive stool in colon, positive gaseous distention in the stomach.  CXR: negative Continue mobilization with physical therapy Continue care  1 Called in to see patient for spasms and SOB.  Currently asymptomatic 2. CXR and KIB are clear 3. Enema and mag citrate for constipation 4. D/c robaxin and change to valium.  This will address anxiety and spasms. 5. Encourage ambulation to aid in resolving muscle spasms Venita Lick, MD [facility] [phone]

## 2019-01-30 NOTE — Progress Notes (Signed)
Patient arrived to unit A&O x4. Complains on pain 6/10. Surgical incision on abdomen is dry and intact. Call light placed within reach. Patient oriented to room. Nurse will continue to monitor. [name]

## 2019-01-30 NOTE — Transfer of Care (Signed)
Immediate Anesthesia Transfer of Care Note  Patient: [name]  Procedure(s) Performed: Anterior Lumbar Interbody Fusion L5-S1 (N/A Back) ABDOMINAL EXPOSURE (N/A Abdomen)  Patient Location: PACU  Anesthesia Type:General  Level of Consciousness: awake, alert , oriented and patient cooperative  Airway & Oxygen Therapy: Patient Spontanous Breathing  Post-op Assessment: Report given to RN and Post -op Vital signs reviewed and stable  Post vital signs: Reviewed and stable  Last Vitals:  Vitals Value Taken Time  BP 107/46 01/30/2019 12:29 PM  Temp    Pulse 96 01/30/2019 12:30 PM  Resp 19 01/30/2019 12:30 PM  SpO2 98 % 01/30/2019 12:30 PM  Vitals shown include unvalidated device data.  Last Pain:  Vitals:   01/30/19 0835  TempSrc:   PainSc: 0-No pain      Patients Stated Pain Goal: 2 (01/30/19 0711)  Complications: No apparent anesthesia complications

## 2019-01-30 NOTE — H&P (Signed)
Addendum dictation by Dr. Shon Baton  Patient continues to have significant back buttock and bilateral neuropathic leg pain.  There is been no significant change other than the increased neuropathic leg pain from her last office visit of 01/18/2019.  Surgical plan is to move forward with an anterior lumbar interbody fusion L5-S1.  I have again gone over the surgical procedure as well as the risks and benefits with the patient.  All of her questions were encouraged and addressed.

## 2019-01-30 NOTE — Anesthesia Procedure Notes (Signed)
Anesthesia Regional Block: TAP block   Pre-Anesthetic Checklist: ,, timeout performed, Correct Patient, Correct Site, Correct Laterality, Correct Procedure, Correct Position, site marked, Risks and benefits discussed,  Surgical consent,  Pre-op evaluation,  At surgeon's request and post-op pain management  Laterality: Right  Prep: chloraprep       Needles:  Injection technique: Single-shot  Needle Type: Echogenic Needle     Needle Length: 9cm  Needle Gauge: 21     Additional Needles:   Narrative:  Start time: 01/30/2019 8:27 AM End time: 01/30/2019 8:31 AM Injection made incrementally with aspirations every 5 mL.  Performed by: Personally  Anesthesiologist: Achille Rich, MD  Additional Notes: Pt tolerated the procedure well.

## 2019-01-30 NOTE — OR Nursing (Signed)
Per protocol Dr. Jena Gauss confirmed at 12:03 that there were no retained instruments.

## 2019-01-30 NOTE — Progress Notes (Signed)
Orthopedic Tech Progress Note Patient Details:  [name] 26-Aug-1966 [id number] Patient has brace. RN checked for me Patient ID: [name], female   DOB: 12/23/1965, 53 y.o.   MRN: [id number]   [name] 01/30/2019, 2:02 PM

## 2019-01-30 NOTE — Plan of Care (Signed)

## 2019-01-30 NOTE — Progress Notes (Signed)
PT Cancellation Note  Patient Details Name: [name] MRN: [id number] DOB: Oct 13, 1965   Cancelled Treatment:    Reason Eval/Treat Not Completed: Pain limiting ability to participate;Patient declined, no reason specified - upon arrival to room, pt hyperventilating and crying stating "I need help, my back is in spasm and pain is shooting down both my legs". RN called to room immediately, PT to check back as schedule allows.  Nicola Police, PT Acute Rehabilitation Services Pager [phone]  Office [phone]    Tyrone Apple D Despina Hidden 01/30/2019, 4:41 PM

## 2019-01-30 NOTE — Progress Notes (Signed)
Pt is s/p:Anterior Lumbar Interbody Fusion L5-S1. She is a Financial controller comp case. Information for her workers comp CM: SedgewickKarin Lieu: [phone] ext: [phone]. All DME and therapy needs will need to be called into [place]. CM following.

## 2019-01-30 NOTE — Progress Notes (Signed)
Patient was hyperventilating, complaining of abdominal pain 10/10, complaining of numbness and tingling in her back and legs. VSS. Abdomen slightly distended. Patient in tears with pain. Dr. Shon Baton notified. Dr. Shon Baton verbalized to nurse to order chest x-ray and abdominal x-ray STAT. Nurse will continue to monitor. [name] [name]

## 2019-01-30 NOTE — Brief Op Note (Signed)
01/30/2019  12:08 PM  PATIENT:  [name]  53 y.o. female  PRE-OPERATIVE DIAGNOSIS:  degenerative disc disease L5-S1  POST-OPERATIVE DIAGNOSIS:  degenerative disc disease L5-S1  PROCEDURE:  Procedure(s) with comments: Anterior Lumbar Interbody Fusion L5-S1 (N/A) - ABDOMINAL EXPOSURE (N/A)  SURGEON:  Surgeon(s) and Role: Panel 1:    Venita Lick, MD - Primary Panel 2:    * Nada Libman, MD - Primary  PHYSICIAN ASSISTANT:   ASSISTANTS: Amanda Ward, PA   ANESTHESIA:   general  EBL:  300 mL   BLOOD ADMINISTERED:110 CC CELLSAVER  DRAINS: none   LOCAL MEDICATIONS USED:  NONE  SPECIMEN:  No Specimen  DISPOSITION OF SPECIMEN:  N/A  COUNTS:  YES  TOURNIQUET:  * No tourniquets in log *  DICTATION: .Dragon Dictation  PLAN OF CARE: Admit for overnight observation  PATIENT DISPOSITION:  PACU - hemodynamically stable.

## 2019-01-30 NOTE — Progress Notes (Signed)
Pt's foley catheter d/c'd at 2010, pt should void by 0110. Per reporting nurse Adelina Mings) Dr. Shon Baton gave VO to d/c Foley prior to 0000 01/30/19. Pt endorsed discomfort with foley in situ and also requested to have foley d/c'd. Charge nurse aware.

## 2019-01-30 NOTE — Anesthesia Procedure Notes (Signed)
Procedure Name: Intubation Date/Time: 01/30/2019 8:46 AM Performed by: Shirlyn Goltz, CRNA Pre-anesthesia Checklist: Patient identified, Emergency Drugs available, Suction available and Patient being monitored Patient Re-evaluated:Patient Re-evaluated prior to induction Oxygen Delivery Method: Circle system utilized Preoxygenation: Pre-oxygenation with 100% oxygen Induction Type: IV induction and Rapid sequence Laryngoscope Size: Mac and 3 Grade View: Grade I Tube type: Oral Tube size: 7.0 mm Number of attempts: 1 Airway Equipment and Method: Stylet Placement Confirmation: ETT inserted through vocal cords under direct vision,  positive ETCO2 and breath sounds checked- equal and bilateral Secured at: 22 cm Tube secured with: Tape Dental Injury: Teeth and Oropharynx as per pre-operative assessment

## 2019-01-30 NOTE — Anesthesia Procedure Notes (Signed)
Anesthesia Regional Block: TAP block   Pre-Anesthetic Checklist: ,, timeout performed, Correct Patient, Correct Site, Correct Laterality, Correct Procedure, Correct Position, site marked, Risks and benefits discussed,  Surgical consent,  Pre-op evaluation,  At surgeon's request and post-op pain management  Laterality: Left  Prep: chloraprep       Needles:  Injection technique: Single-shot  Needle Type: Echogenic Needle     Needle Length: 9cm  Needle Gauge: 21     Additional Needles:   Narrative:  Start time: 01/30/2019 8:22 AM End time: 01/30/2019 8:26 AM Injection made incrementally with aspirations every 5 mL.  Performed by: Personally  Anesthesiologist: Achille Rich, MD  Additional Notes: Pt tolerated the procedure well.

## 2019-01-30 NOTE — Op Note (Signed)
Operative report  Preoperative diagnosis: Degenerative lumbar disc disease L5-S1 with bilateral lower extremity radiculopathy  Postoperative diagnosis: Same  Operative procedure anterior lumbar interbody fusion L5-S1  First assistant: Glynis Smiles, PA  Approach surgeon: Dr. Durene Cal  EBL 300 cc.  Cell saver return: 110 cc  Complications: None  Implants: Size 14 lordotic peek  intervertebral spacer.  RSB 0 profile large corresponding plate.  Affixed with 25 mm length screws x3.  Allograft: vivogen  Indications: This is a very pleasant 53 year old female who was injured in a work accident and has been having progressive debilitating back buttock and bilateral lower extremity neuropathic pain.  Despite prolonged conservative management her quality of life and pain has progressively gotten worse.  As result of the aggression and her neuropathic symptoms we elected to move forward with surgery.  All appropriate risks benefits and alternatives were discussed with the patient and consent was obtained.  Operative report: Patient is brought the operating room placed upon the operating room table.  After successful induction of general anesthesia and endotracheal the patient teds SCDs and a Foley were inserted.  Timeout was taken to confirm patient procedure and all other important data.  The abdomen was prepped and draped in a standard fashion.  Dr. Myra Gianotti then scrubbed in to perform a standard retroperitoneal approach to the anterior lumbar spine.  Please refer to his dictation for details.  Once the [facility] retractor blades were placed and hemostasis was obtained I then scrubbed in and Dr. Durene Cal scrubbed out.  The L5-S1 disc space was marked with a needle and intraoperative x-ray was taken confirming that we are at the appropriate level.  An annulotomy was performed with a 10 blade scalpel and then I began removing the disc material with pituitary Aundria Rud.  I then gently distracted  the intervertebral space and continued working posteriorly using curettes and pituitary rongeurs.  Once I was down to the posterior portion of the vertebral body I then used my nerve hook to gently dissect through the remaining posterior annulus and then used a 2 mm Kerrison Roger to resect the posterior annulus.  I was able to then place a small curved curette behind the body of S1 and release the posterior annulus completely.  At this point I was very pleased with the discectomy.  I had removed all the cartilaginous endplate and I had nice bleeding subchondral bone.  I also had released posteriorly to allow for parallel endplate distraction.  To confirm parallel endplate distraction I placed the lamina spreader into the intervertebral space and using live fluoroscopy I gently distracted and released multiple times confirming that parallel distraction and there was no loss in foraminal volume.  At this point with the discectomy complete I then trialed with rasp to use the size 12 and the size 14 lordotic trials.  14 lordotic provided the best overall fit and restored lumbar lordosis.  I obtained the implant packed it with the allograft and malleted into place.  The 0 profile plate was then malleted over the intervertebral spacer and then secured into position.  An awl was used to pierce the superior cortex and the 25 mm length screw was advanced.  I then placed an awl in the inferior screw hole and then placed a 25 mm screw.  Final superior screw was placed.  All screws had excellent purchase.  At this point I placed the locking device over the 3 screws and screw down and torqued off according manufacturer standards.  At  this point I had satisfactory positioning of my intervertebral spacer and 0 profile anterior plate.  Intraoperative fluoroscopy views at this point demonstrated satisfactory positioning in both the AP and lateral planes.  I then sequentially removed my retractors and confirmed that I had  maintained hemostasis.  I did place FloSeal over the iliac vessels to aid in overall hemostasis and recovery.  The fascia of the rectus was then closed with #1 Prolene sutures.  Running self locking suture was placed and secured.  Superficial layer was closed with interrupted 2-0 Vicryl suture and the skin was reapproximated with 3-0 Monocryl.  Intraoperative final AP x-ray was taken and there were no retained surgical instruments in the body.  All needle and sponge counts were correct.  Steri-Strips and a dry dressing were ultimately applied and the patient was extubated transfer the PACU without incident.

## 2019-01-30 NOTE — H&P (Signed)
[facility] [place]  Patient name: [name]            MRN: [id number]        DOB: [birth year]            Sex: female   REQUESTING PROVIDER:    Dr. Shon Baton   REASON FOR CONSULT:    ALIF  HISTORY OF PRESENT ILLNESS:   [name] is a 53 y.o. female, who is referred today for evaluation for anterior exposure L5-S1.  The patient has been recommended to have anterior instrumentation.  She is a non-smoker.  Her only abdominal operation was a partial hysterectomy 2018  PAST MEDICAL HISTORY        Past Medical History:  Diagnosis Date  . Anxiety   . Asthma    seasonal  . Pelvic pain      FAMILY HISTORY        Family History  Problem Relation Age of Onset  . Hypertension Mother   . Breast cancer Other        GREAT AUNT    SOCIAL HISTORY:   Social History        Socioeconomic History  . Marital status: Divorced    Spouse name: Not on file  . Number of children: Not on file  . Years of education: Not on file  . Highest education level: Not on file  Occupational History  . Not on file  Social Needs  . Financial resource strain: Not on file  . Food insecurity:    Worry: Not on file    Inability: Not on file  . Transportation needs:    Medical: Not on file    Non-medical: Not on file  Tobacco Use  . Smoking status: Never Smoker  . Smokeless tobacco: Never Used  Substance and Sexual Activity  . Alcohol use: Yes    Alcohol/week: 1.0 standard drinks    Types: 1 Glasses of wine per week    Comment: rarely   . Drug use: No  . Sexual activity: Not Currently  Lifestyle  . Physical activity:    Days per week: Not on file    Minutes per session: Not on file  . Stress: Not on file  Relationships  . Social connections:    Talks on phone: Not on file    Gets together: Not on file    Attends religious service: Not on file    Active member of  club or organization: Not on file    Attends meetings of clubs or organizations: Not on file    Relationship status: Not on file  . Intimate partner violence:    Fear of current or ex partner: Not on file    Emotionally abused: Not on file    Physically abused: Not on file    Forced sexual activity: Not on file  Other Topics Concern  . Not on file  Social History Narrative  . Not on file    ALLERGIES:    No Known Allergies  CURRENT MEDICATIONS:          Current Outpatient Medications  Medication Sig Dispense Refill  . acetaminophen (TYLENOL) 500 MG tablet Take 500-1,000 mg by mouth every 6 (six) hours as needed for moderate pain.    Marland Kitchen ALPRAZolam (XANAX) 1 MG tablet Take 1 tablet (1 mg total) by mouth at bedtime. 30 tablet 5  . amoxicillin-clavulanate (AUGMENTIN) 875-125 MG tablet Take 1 tablet by mouth 2 (two)  100 MCG INTO THE LUNGS DAILY. (Patient taking differently: Take 100 mcg by mouth daily as needed (sob). ) 60 each 5  . ibuprofen (ADVIL,MOTRIN) 600 MG tablet Take 1 tablet (600 mg total) by mouth every 8 (eight) hours as needed. 60 tablet 1  . ibuprofen (ADVIL,MOTRIN) 800 MG tablet Take 800 mg by mouth every 8 (eight) hours as needed.    . Melatonin 3 MG CAPS Take 3 mg by mouth daily.    . montelukast (SINGULAIR) 10 MG tablet Take 1 tablet (10 mg total) by mouth at bedtime. 90 tablet 3  . Multiple Vitamin (MULTIVITAMIN) tablet Take 1 tablet by mouth daily.    . PROAIR HFA 108 (90 Base) MCG/ACT inhaler TAKE 2 PUFFS BY MOUTH EVERY 6 HOURS AS NEEDED FOR WHEEZE OR SHORTNESS OF BREATH (Patient taking differently: Inhale 2 puffs into the lungs every 6 (six) hours as needed for shortness of breath. ) 8.5 Inhaler 5  . rizatriptan (MAXALT-MLT) 10 MG disintegrating tablet Take 1 tablet (10 mg total) by mouth as needed for  migraine. May repeat in 2 hours if needed 30 tablet 3  . traMADol (ULTRAM) 50 MG tablet Take 50 mg by mouth 3 (three) times daily as needed.    . valACYclovir (VALTREX) 500 MG tablet Take 1 tablet (500 mg total) by mouth 2 (two) times daily. (Patient taking differently: Take 500 mg by mouth 2 (two) times daily as needed (outbreak). ) 180 tablet 3   No current facility-administered medications for this visit.     REVIEW OF SYSTEMS:   [X] denotes positive finding, [ ] denotes negative finding Cardiac  Comments:  Chest pain or chest pressure:    Shortness of breath upon exertion:    Short of breath when lying flat:    Irregular heart rhythm:        Vascular    Pain in calf, thigh, or hip brought on by ambulation:    Pain in feet at night that wakes you up from your sleep:     Blood clot in your veins:    Leg swelling:         Pulmonary    Oxygen at home:    Productive cough:     Wheezing:         Neurologic    Sudden weakness in arms or legs:     Sudden numbness in arms or legs:     Sudden onset of difficulty speaking or slurred speech:    Temporary loss of vision in one eye:     Problems with dizziness:         Gastrointestinal    Blood in stool:      Vomited blood:         Genitourinary    Burning when urinating:     Blood in urine:        Psychiatric    Major depression:         Hematologic    Bleeding problems:    Problems with blood clotting too easily:        Skin    Rashes or ulcers:        Constitutional    Fever or chills:     PHYSICAL EXAM:   Vitals:   12/10/18 1123  BP: 93/63  Pulse: 83  Resp: 20  Temp: (!) 97.3 F (36.3 C)  SpO2: 99%  Weight: 58.3 kg  Height: 5' 7" (1.702 m)    GENERAL: The patient is a   well-nourished female, in no acute distress. The vital signs are documented above. CARDIAC: There is a regular rate and rhythm.  VASCULAR: Palpable pedal pulses PULMONARY: Nonlabored respirations MUSCULOSKELETAL: There are no major  deformities or cyanosis. NEUROLOGIC: No focal weakness or paresthesias are detected. SKIN: There are no ulcers or rashes noted. PSYCHIATRIC: The patient has a normal affect.  STUDIES:   I have reviewed her MRI  ASSESSMENT and PLAN   Degenerative back disease, L5-S1: We discussed the details of the procedure including a left lower quadrant transverse incision.  We discussed the risk of injury to the artery and vein as well as the ureter.  We also discussed the risk of wound complications and hernia.  All her questions were answered.   Wells Brabham, IV, MD, FACS [facility] [place] Tel [phone] Pager [phone] 

## 2019-01-30 NOTE — Op Note (Signed)
    Patient name: [name] MRN: [id number] DOB: [birth year] Sex: female  01/30/2019 Pre-operative Diagnosis: Degenerative back disease, L5-S1 Post-operative diagnosis:  Same Surgeon:  Durene Cal Co-Surgeon:  D. Shon Baton, M.D. Procedure:   Anterior exposure, L5-S1 Anesthesia:  General Blood Loss:  300cc Specimens:  none  Findings:  Normal anatomy,  Small injury to left iliac vein repaired with 5.0 prolene  Indications:  The patient comes in for repair.  She is havening worsening symptoms  Procedure:  The patient was identified in the holding area and taken to [facility] OR ROOM 03  The patient was then placed supine on the table. general anesthesia was administered.  The patient was prepped and draped in the usual sterile fashion.  A time out was called and antibiotics were administered.  Fluoroscopy was used to determine the appropriate level of skin incision.  A left lower quadrant transverse incision was made beginning at the midline extending laterally.  Cautery was used to divide subcutaneous tissue down to the abdominal wall fascia which was opened with cautery.  Subfascial flaps were then raised.  I then entered the retroperitoneum from lateral to the rectus muscle and dissected up along the iliac artery and vein.  The ureter was protected.  I divided the median sacral vessels.  I then mobilized either side of the spine.  At this point, I removed the retractors and then placed them medial to the rectus muscle.  The Thompson retractor was set in place.  A 150 reverse lip blade was placed on the right side of the spine.  I then tried to further mobilize the left iliac vein.  Upon doing so there was a hole created in the iliac vein.  Approximately 200 cc of blood were lost trying to repair this.  It was repaired using 2 pledgeted 5-0 Prolene sutures.  There was no narrowing of the iliac vein.  Once this was repaired I was able to place a 100 reverse lip blade on the left lateral side of the  spine.  Malleable retractors were placed superiorly and inferiorly.  Fluoroscopy was then brought back in and confirmed that we are at the appropriate level.  At this point, Dr. Shon Baton came in and performed his portion of the procedure.  Please see his detailed operative note for the remaining portion of the procedure.   Disposition:  To PACU stable   V. Durene Cal, M.D., [facility] [place] Office: [phone] Pager:  [phone]

## 2019-01-30 NOTE — Discharge Instructions (Signed)

## 2019-01-30 NOTE — Progress Notes (Addendum)
Pt voided at 2200 this shift post d/c'd of foley cath. Pt ambulated to restroom x2 with brace at 2218 and at 2200 w/o brace (pt educated on importance of brace being on when walking). Foley d/c'd at 2010.

## 2019-01-31 ENCOUNTER — Ambulatory Visit (HOSPITAL_COMMUNITY): Payer: No Typology Code available for payment source

## 2019-01-31 ENCOUNTER — Encounter (HOSPITAL_COMMUNITY): Payer: Self-pay | Admitting: Orthopedic Surgery

## 2019-01-31 DIAGNOSIS — I82409 Acute embolism and thrombosis of unspecified deep veins of unspecified lower extremity: Secondary | ICD-10-CM

## 2019-01-31 MED ORDER — BISACODYL 10 MG RE SUPP
10.0000 mg | Freq: Once | RECTAL | Status: DC
  Filled 2019-01-31: qty 1

## 2019-01-31 MED ORDER — DIAZEPAM 5 MG PO TABS: 5.0000 mg | ORAL_TABLET | Freq: Three times a day (TID) | ORAL | 0 refills | Status: AC | PRN

## 2019-01-31 MED FILL — Thrombin (Recombinant) For Soln 20000 Unit: CUTANEOUS | Qty: 1 | Status: AC

## 2019-01-31 NOTE — Evaluation (Signed)
Physical Therapy Evaluation Patient Details Name: [name] MRN: [id number] DOB: [birth year] Today's Date: 01/31/2019   History of Present Illness  Pt is a 53 yo female s/p Anterior Lumbar Interbody Fusion L5-S1 (N/A).  Pmhx: asthma, anxiety  Clinical Impression  Pt admitted with above diagnosis. Pt currently with functional limitations due to the deficits listed below (see PT Problem List). At the time of PT eval pt was able to perform transfers and ambulation with gross supervision for safety to modified independence with RW. Pt was educated on precautions, brace application/wearing schedule, car transfer, activity progression, and general safety with home mobility. Pt will benefit from skilled PT to increase their independence and safety with mobility to allow discharge to the venue listed below.       Follow Up Recommendations No PT follow up;Supervision for mobility/OOB    Equipment Recommendations  Rolling walker with 5" wheels    Recommendations for Other Services       Precautions / Restrictions Precautions Precautions: Fall;Back Precaution Booklet Issued: Yes (comment) Precaution Comments: OT provided handout. PT reveiewed precautions verbally and pt was cued for maintenance of precautions during functional mobility.  Restrictions Weight Bearing Restrictions: No Other Position/Activity Restrictions: spinal precautions reviewed      Mobility  Bed Mobility Overal bed mobility: Modified Independent             General bed mobility comments: Pt able to transition to EOB with HOB flat and rails lowered to simulate home environment.   Transfers Overall transfer level: Modified independent Equipment used: Rolling walker (2 wheeled)             General transfer comment: VC's for hand placement on seated surface for safety.   Ambulation/Gait Ambulation/Gait assistance: Supervision Gait Distance (Feet): 200 Feet Assistive device: Rolling walker (2  wheeled) Gait Pattern/deviations: Step-through pattern;Decreased stride length;Trunk flexed Gait velocity: Decreased Gait velocity interpretation: 1.31 - 2.62 ft/sec, indicative of limited community ambulator General Gait Details: Lowered walker for more optimal fit. Pt was cued for improved posture and closer walker proximity. Supervision for safety.   Stairs Stairs: Yes Stairs assistance: Supervision Stair Management: Two rails;Step to pattern;Forwards Number of Stairs: 10 General stair comments: VC's for sequencing and general safety.   Wheelchair Mobility    Modified Rankin (Stroke Patients Only)       Balance Overall balance assessment: Mild deficits observed, not formally tested                                           Pertinent Vitals/Pain Pain Assessment: 0-10 Pain Score: 5  Pain Location: low back/abdomen Pain Descriptors / Indicators: Discomfort;Guarding Pain Intervention(s): Monitored during session    Home Living Family/patient expects to be discharged to:: Private residence Living Arrangements: Alone Available Help at Discharge: Family;Friend(s);Available 24 hours/day Type of Home: Apartment Home Access: Stairs to enter Entrance Stairs-Rails: Can reach both Entrance Stairs-Number of Steps: 12 Home Layout: One level Home Equipment: None      Prior Function Level of Independence: Independent         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     Hand Dominance   Dominant Hand: Right    Extremity/Trunk Assessment   Upper Extremity Assessment Upper Extremity Assessment: Defer to OT evaluation    Lower Extremity Assessment Lower Extremity Assessment: Overall WFL for tasks assessed  Cervical / Trunk Assessment Cervical / Trunk Assessment: Normal  Communication   Communication: No difficulties  Cognition Arousal/Alertness: Awake/alert Behavior During Therapy: WFL for tasks  assessed/performed Overall Cognitive Status: Within Functional Limits for tasks assessed                                        General Comments      Exercises     Assessment/Plan    PT Assessment Patient needs continued PT services  PT Problem List Decreased strength;Decreased activity tolerance;Decreased mobility;Decreased knowledge of use of DME;Decreased safety awareness;Decreased knowledge of precautions;Pain       PT Treatment Interventions DME instruction;Gait training;Stair training;Functional mobility training;Therapeutic activities;Therapeutic exercise;Neuromuscular re-education;Patient/family education    PT Goals (Current goals can be found in the Care Plan section)  Acute Rehab PT Goals Patient Stated Goal: Home today PT Goal Formulation: With patient Time For Goal Achievement: 02/07/19 Potential to Achieve Goals: Good    Frequency Min 5X/week   Barriers to discharge        Co-evaluation               AM-PAC PT "6 Clicks" Mobility  Outcome Measure Help needed turning from your back to your side while in a flat bed without using bedrails?: None Help needed moving from lying on your back to sitting on the side of a flat bed without using bedrails?: None Help needed moving to and from a bed to a chair (including a wheelchair)?: None Help needed standing up from a chair using your arms (e.g., wheelchair or bedside chair)?: None Help needed to walk in hospital room?: A Little Help needed climbing 3-5 steps with a railing? : A Little 6 Click Score: 22    End of Session Equipment Utilized During Treatment: Gait belt;Back brace Activity Tolerance: Patient tolerated treatment well Patient left: in bed;with call bell/phone within reach Nurse Communication: Patient requests pain meds PT Visit Diagnosis: Unsteadiness on feet (R26.81)    Time: [id number] PT Time Calculation (min) (ACUTE ONLY): 24 min   Charges:   PT Evaluation $PT Eval  Moderate Complexity: 1 Mod PT Treatments $Gait Training: 8-22 mins        Conni SlipperLaura Demetrius Barrell, PT, DPT Acute Rehabilitation Services Pager: [phone] Office: [phone]   [name] 01/31/2019, 1:15 PM

## 2019-01-31 NOTE — Evaluation (Signed)
Occupational Therapy Evaluation Patient Details Name: [name] MRN: [id number] DOB: 03/26/1966 Today's Date: 01/31/2019    History of Present Illness Pt is a 53 yo female s/p Anterior Lumbar Interbody Fusion L5-S1 (N/A).  Pmhx: asthma, anxiety   Clinical Impression   Pt PTA: awaiting surgery since August. Pt requiring assist for LB ADL. Pt currently performing ADL functional mobility with supervisionA. Pt performing toilet hygiene with supervision in standing. Pt performing LB dressing at EOB to don pants and socks with figure 4 technique and able to don/doff brace with no assist. Pt with low commode, tub shower and inability it ambulate well and perform ADLs without 3in1 commode, RW and shower chair. Pt education provided on proper techniques for ADL, mobility and bed mobility. No OT follow-up required.      Follow Up Recommendations  No OT follow up;Supervision - Intermittent    Equipment Recommendations  3 in 1 bedside commode;Tub/shower seat;Other (comment)(RW)    Recommendations for Other Services       Precautions / Restrictions Precautions Precautions: Fall;Back Precaution Booklet Issued: Yes (comment) Precaution Comments: OT provided handout. PT reveiewed precautions verbally and pt was cued for maintenance of precautions during functional mobility.  Restrictions Weight Bearing Restrictions: No Other Position/Activity Restrictions: spinal precautions reviewed      Mobility Bed Mobility Overal bed mobility: Modified Independent             General bed mobility comments: Pt able to transition to EOB with HOB flat and rails lowered to simulate home environment.   Transfers Overall transfer level: Modified independent Equipment used: Rolling walker (2 wheeled)             General transfer comment: VC's for hand placement on seated surface for safety.     Balance Overall balance assessment: Mild deficits observed, not formally tested                                          ADL either performed or assessed with clinical judgement   ADL Overall ADL's : Needs assistance/impaired Eating/Feeding: Modified independent;Sitting   Grooming: Supervision/safety;Standing   Upper Body Bathing: Set up;Sitting   Lower Body Bathing: Supervison/ safety;With adaptive equipment;Sitting/lateral leans;Sit to/from stand   Upper Body Dressing : Supervision/safety;Sitting   Lower Body Dressing: Supervision/safety;Cueing for safety;Sitting/lateral leans;Sit to/from stand   Toilet Transfer: Supervision/safety;Grab bars;Comfort height toilet;BSC   Toileting- Clothing Manipulation and Hygiene: Supervision/safety;Sitting/lateral lean;Sit to/from stand       Functional mobility during ADLs: Supervision/safety;Rolling walker General ADL Comments: supervisonA level for all ADL     Vision Baseline Vision/History: No visual deficits Vision Assessment?: No apparent visual deficits     Perception     Praxis      Pertinent Vitals/Pain Pain Assessment: 0-10 Pain Score: 5  Pain Location: low back/abdomen Pain Descriptors / Indicators: Discomfort;Guarding Pain Intervention(s): Monitored during session     Hand Dominance Right   Extremity/Trunk Assessment Upper Extremity Assessment Upper Extremity Assessment: Defer to OT evaluation   Lower Extremity Assessment Lower Extremity Assessment: Overall WFL for tasks assessed   Cervical / Trunk Assessment Cervical / Trunk Assessment: Normal   Communication Communication Communication: No difficulties   Cognition Arousal/Alertness: Awake/alert Behavior During Therapy: WFL for tasks assessed/performed Overall Cognitive Status: Within Functional Limits for tasks assessed  General Comments  Pt does well, but requires assist for transferring from low surfaces requiring 3in1 commode and shower chair and RW for mobility     Exercises     Shoulder Instructions      Home Living Family/patient expects to be discharged to:: Private residence Living Arrangements: Alone Available Help at Discharge: Family;Friend(s);Available 24 hours/day Type of Home: Apartment Home Access: Stairs to enter Entrance Stairs-Number of Steps: 12 Entrance Stairs-Rails: Can reach both Home Layout: One level     Bathroom Shower/Tub: Chief Strategy Officer: Standard     Home Equipment: None          Prior Functioning/Environment Level of Independence: Independent        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        OT Problem List: Decreased strength;Impaired balance (sitting and/or standing)      OT Treatment/Interventions:      OT Goals(Current goals can be found in the care plan section) Acute Rehab OT Goals Patient Stated Goal: Home today  OT Frequency:     Barriers to D/C:            Co-evaluation              AM-PAC OT "6 Clicks" Daily Activity     Outcome Measure Help from another person eating meals?: None Help from another person taking care of personal grooming?: None Help from another person toileting, which includes using toliet, bedpan, or urinal?: A Little Help from another person bathing (including washing, rinsing, drying)?: A Little Help from another person to put on and taking off regular upper body clothing?: None Help from another person to put on and taking off regular lower body clothing?: A Little 6 Click Score: 21   End of Session Equipment Utilized During Treatment: Rolling walker Nurse Communication: Mobility status  Activity Tolerance: Patient tolerated treatment well Patient left: in bed;with call bell/phone within reach  OT Visit Diagnosis: Unsteadiness on feet (R26.81);Muscle weakness (generalized) (M62.81)                Time: 8527-7824 OT Time Calculation (min): 43 min Charges:  OT General Charges $OT Visit:  1 Visit OT Evaluation $OT Eval Moderate Complexity: 1 Mod OT Treatments $Self Care/Home Management : 23-37 mins  Revonda Standard Cecil Cranker) Glendell Docker OTR/L Acute Rehabilitation Services Pager: [phone] Office: [phone]   Gunnar Fusi Mcdaniel Ohms 01/31/2019, 1:29 PM

## 2019-01-31 NOTE — Progress Notes (Signed)
Subjective: 1 Day Post-Op Procedure(s) (LRB): Anterior Lumbar Interbody Fusion L5-S1 (N/A) ABDOMINAL EXPOSURE (N/A) Patient reports pain as mild.  Well controlled on Tylenol. Patient says she does not like the way percocet makes her feel. Valium helping significantly with spasms and anxiety. Patient tolerating breakfast well, no nausea. Denies CP, SOB, dizziness/lightheadedness. +Void, +Flatus, -BM, but abdomen significantly less distended than yesterday per patient. Patient ambulating.   Objective: Vital signs in last 24 hours: Temp:  [97.2 F (36.2 C)-100 F (37.8 C)] 99.5 F (37.5 C) (05/07 0520) Pulse Rate:  [57-96] 87 (05/07 0358) Resp:  [14-22] 18 (05/07 0358) BP: (103-130)/(43-82) 112/59 (05/07 0358) SpO2:  [93 %-100 %] 96 % (05/07 0358)  Intake/Output from previous day: 05/06 0701 - 05/07 0700 In: 2460 [I.V.:2300; Blood:110; IV Piggyback:50] Out: 2850 [Urine:2550; Blood:300] Intake/Output this shift: Total I/O In: 240 [P.O.:240] Out: -   Recent Labs    01/30/19 1424  HGB 12.2   Recent Labs    01/30/19 1424  WBC 12.6*  RBC 4.06  HCT 36.8  PLT 229   Recent Labs    01/30/19 1424  CREATININE 0.83   No results for input(s): LABPT, INR in the last 72 hours.  Neurologically intact ABD soft Neurovascular intact Sensation intact distally Intact pulses distally Dorsiflexion/Plantar flexion intact Incision: dressing C/D/I No cellulitis present Compartment soft, no palpable cords, no calf tenderness, Homans negative bilaterally.    Assessment/Plan: 1 Day Post-Op Procedure(s) (LRB): Anterior Lumbar Interbody Fusion L5-S1 (N/A) ABDOMINAL EXPOSURE (N/A) Advance diet Up with therapy  Suppository to encourage BM prior to discharge. DVT PPX: Teds, SCDs, ambulation Continue IS  Plan to Discharge home this afternoon with following changes: Will D/c robaxin and replace with Valium for both muscle spasms and anxiety Will D/c percocet, as patient prefers tramadol  and has that at home. She will call if she needs something stronger.  Leonette Monarch Ward 01/31/2019, 8:02 AM

## 2019-01-31 NOTE — Progress Notes (Signed)
LE venous duplex       has been completed. Preliminary results can be found under CV proc through chart review. Marcos Ruelas, BS, RDMS, RVT   

## 2019-01-31 NOTE — Care Management (Addendum)
13:35 LM, hippa compliant,  requesting callback for DME needs to [facility]: [phone] ext: [phone].  14:15 Attempt to reach patient, she did not answer room phone, did not answer cell phone.  14:33 Received call back from [place]. Provided with fax number to send DME orders. Ladene Artist will obtain [place]. Went to patient's room, patient had already been discharged. Will request Ladene Artist to contact patient and have DME delivered to her house.

## 2019-01-31 NOTE — Progress Notes (Signed)
Pt ambulated to restroom and back to bed.

## 2019-01-31 NOTE — Progress Notes (Signed)
Pt ambulated to restroom and back to bed with front wheel walker brace on.

## 2019-01-31 NOTE — Anesthesia Postprocedure Evaluation (Signed)
Anesthesia Post Note  Patient: [name]  Procedure(s) Performed: Anterior Lumbar Interbody Fusion L5-S1 (N/A Back) ABDOMINAL EXPOSURE (N/A Abdomen)     Patient location during evaluation: PACU Anesthesia Type: General Level of consciousness: awake and alert Pain management: pain level controlled Vital Signs Assessment: post-procedure vital signs reviewed and stable Respiratory status: spontaneous breathing, nonlabored ventilation, respiratory function stable and patient connected to nasal cannula oxygen Cardiovascular status: blood pressure returned to baseline and stable Postop Assessment: no apparent nausea or vomiting Anesthetic complications: no    Last Vitals:  Vitals:   01/31/19 0520 01/31/19 0800  BP:  105/62  Pulse:  99  Resp:  18  Temp: 37.5 C 36.8 C  SpO2:  99%    Last Pain:  Vitals:   01/31/19 0800  TempSrc: Oral  PainSc:                  Marissa Weaver S

## 2019-01-31 NOTE — Progress Notes (Signed)
Pt ambulated to bathroom and back to bed with front wheel walker.

## 2019-01-31 NOTE — Progress Notes (Signed)
[facility]  Subjective  - No complaints.  Tolerating PO.  No abdominal pain.   Objective 113/63 (!) 107 99.4 F (37.4 C) (Oral) 18 99%  Intake/Output Summary (Last 24 hours) at 01/31/2019 1205 Last data filed at 01/31/2019 0755 Gross per 24 hour  Intake 1390 ml  Output 2200 ml  Net -810 ml    Abdominal incision c/d/i Palpable DP pulses BLE  Laboratory Lab Results: Recent Labs    01/30/19 1424  WBC 12.6*  HGB 12.2  HCT 36.8  PLT 229   BMET Recent Labs    01/30/19 1424  CREATININE 0.83    COAG Lab Results  Component Value Date   INR 0.9 01/23/2019   No results found for: PTT  Assessment/Planning:  Doing well POD#1 s/p L5-S1 ALIF.  Small injury to left iliac vein - no evidence of LE DVT today.  OK for discharge from our standpoint.      [name] 01/31/2019 12:05 PM --

## 2019-02-01 ENCOUNTER — Other Ambulatory Visit: Payer: Self-pay

## 2019-02-01 ENCOUNTER — Ambulatory Visit (INDEPENDENT_AMBULATORY_CARE_PROVIDER_SITE_OTHER): Payer: 59 | Admitting: Family Medicine

## 2019-02-01 ENCOUNTER — Encounter (HOSPITAL_COMMUNITY): Payer: Self-pay | Admitting: Orthopedic Surgery

## 2019-02-01 DIAGNOSIS — N390 Urinary tract infection, site not specified: Secondary | ICD-10-CM

## 2019-02-01 LAB — POC URINALSYSI DIPSTICK (AUTOMATED)
Blood, UA: NEGATIVE
Glucose, UA: NEGATIVE
Protein, UA: POSITIVE — AB
Spec Grav, UA: 1.015 (ref 1.010–1.025)
Urobilinogen, UA: 1 E.U./dL
pH, UA: 6.5 (ref 5.0–8.0)

## 2019-02-01 MED ORDER — CIPROFLOXACIN HCL 500 MG PO TABS: 500.0000 mg | ORAL_TABLET | Freq: Two times a day (BID) | ORAL | 0 refills | Status: DC

## 2019-02-01 MED FILL — Sodium Chloride IV Soln 0.9%: INTRAVENOUS | Qty: 1000 | Status: AC

## 2019-02-01 MED FILL — Heparin Sodium (Porcine) Inj 1000 Unit/ML: INTRAMUSCULAR | Qty: 30 | Status: AC

## 2019-02-01 NOTE — Progress Notes (Signed)
Subjective:    Patient ID: [name], female    DOB: 08/30/1966, 53 y.o.   MRN: [id number]  HPI Virtual Visit via Video Note  I connected with the patient on 02/01/19 at  2:45 PM EDT by a video enabled telemedicine application and verified that I am speaking with the correct person using two identifiers.  Location patient: home Location provider:work or home office Persons participating in the virtual visit: patient, provider  I discussed the limitations of evaluation and management by telemedicine and the availability of in person appointments. The patient expressed understanding and agreed to proceed.   HPI: Here for what she thinks is a UTI. She had a lumbar fusion surgery on 01-29-19 and she had a foley catheter in place for several days. Now for 2 days she has had urinary urgency and burning. No nausea or fever. Drinking lots of water and taking AZO.    ROS: See pertinent positives and negatives per HPI.  Past Medical History:  Diagnosis Date   Anxiety    Asthma    seasonal   Headache    Pelvic pain     Past Surgical History:  Procedure Laterality Date   ABDOMINAL EXPOSURE N/A 01/30/2019   Procedure: ABDOMINAL EXPOSURE;  Surgeon: Nada LibmanBrabham, Vance W, MD;  Location: [facility] OR;  Service: Vascular;  Laterality: N/A;   ANTERIOR LUMBAR FUSION N/A 01/30/2019   Procedure: Anterior Lumbar Interbody Fusion L5-S1;  Surgeon: Venita LickBrooks, Dahari, MD;  Location: [facility] OR;  Service: Orthopedics;  Laterality: N/A;  180min   CERVICAL BIOPSY  W/ LOOP ELECTRODE EXCISION     NOV 2014   CHOLECYSTECTOMY     LAPAROSCOPIC SALPINGO OOPHERECTOMY Bilateral 03/16/2017   Procedure: LAPAROSCOPIC SALPINGO OOPHORECTOMY;  Surgeon: [name], Timothy P, MD;  Location: [facility];  Service: Gynecology;  Laterality: Bilateral;   OPEN REDUCTION INTERNAL FIXATION (ORIF) DISTAL RADIAL FRACTURE Right 05/02/2018   Procedure: OPEN REDUCTION INTERNAL FIXATION (ORIF) DISTAL RADIAL FRACTURE;   Surgeon: Bradly Bienenstockrtmann, Fred, MD;  Location: [facility] OR;  Service: Orthopedics;  Laterality: Right;   PELVIC LAPAROSCOPY      Family History  Problem Relation Age of Onset   Hypertension Mother    Breast cancer Other        GREAT AUNT     Current Outpatient Medications:    diazepam (VALIUM) 5 MG tablet, Take 1 tablet (5 mg total) by mouth every 8 (eight) hours as needed for up to 5 days for anxiety or muscle spasms., Disp: 15 tablet, Rfl: 0   enoxaparin (LOVENOX) 40 MG/0.4ML injection, Inject 0.4 mLs (40 mg total) into the skin daily. 10 day supply 1 injection per day, Disp: 10 Syringe, Rfl: 0   enoxaparin (LOVENOX) 40 MG/0.4ML injection, Lovenox 40 mg/0.4 mL subcutaneous syringe  Inject 0.4 mL every day by subcutaneous route as directed for 10 days., Disp: , Rfl:    fluticasone (FLONASE) 50 MCG/ACT nasal spray, Place 2 sprays into both nostrils daily., Disp: 16 g, Rfl: 6   Fluticasone Propionate, Inhal, (FLOVENT DISKUS) 100 MCG/BLIST AEPB, INHALE 100 MCG INTO THE LUNGS DAILY. (Patient taking differently: Take 100 mcg by mouth daily. ), Disp: 60 each, Rfl: 5   montelukast (SINGULAIR) 10 MG tablet, Take 1 tablet (10 mg total) by mouth at bedtime., Disp: 90 tablet, Rfl: 3   ondansetron (ZOFRAN) 4 MG tablet, Take 1 tablet (4 mg total) by mouth every 8 (eight) hours as needed for nausea or vomiting., Disp: 20 tablet, Rfl: 0   ondansetron (  ZOFRAN) 4 MG tablet, Zofran 4 mg tablet  Take 2 tablets every 8 hours by oral route as needed for 5 days.  Take 1-2 tabs every 8 hours as needed for nausea, Disp: , Rfl:    PROAIR HFA 108 (90 Base) MCG/ACT inhaler, TAKE 2 PUFFS BY MOUTH EVERY 6 HOURS AS NEEDED FOR WHEEZE OR SHORTNESS OF BREATH (Patient taking differently: Inhale 2 puffs into the lungs every 6 (six) hours as needed for shortness of breath. ), Disp: 8.5 Inhaler, Rfl: 5   rizatriptan (MAXALT-MLT) 10 MG disintegrating tablet, Take 1 tablet (10 mg total) by mouth as needed for migraine. May repeat in  2 hours if needed, Disp: 30 tablet, Rfl: 3   traMADol (ULTRAM) 50 MG tablet, Take 50 mg by mouth 3 (three) times daily as needed., Disp: , Rfl:    valACYclovir (VALTREX) 500 MG tablet, Take 1 tablet (500 mg total) by mouth 2 (two) times daily. (Patient taking differently: Take 500 mg by mouth 2 (two) times daily as needed (outbreak). ), Disp: 180 tablet, Rfl: 3   ciprofloxacin (CIPRO) 500 MG tablet, Take 1 tablet (500 mg total) by mouth 2 (two) times daily., Disp: 14 tablet, Rfl: 0  EXAM:  VITALS per patient if applicable:  GENERAL: alert, oriented, appears well and in no acute distress  HEENT: atraumatic, conjunttiva clear, no obvious abnormalities on inspection of external nose and ears  NECK: normal movements of the head and neck  LUNGS: on inspection no signs of respiratory distress, breathing rate appears normal, no obvious gross SOB, gasping or wheezing  CV: no obvious cyanosis  MS: moves all visible extremities without noticeable abnormality  PSYCH/NEURO: pleasant and cooperative, no obvious depression or anxiety, speech and thought processing grossly intact  ASSESSMENT AND PLAN: UTI, treat with Cipro. Culture the sample.  Gershon Crane, MD  Discussed the following assessment and plan:  Urinary tract infection without hematuria, site unspecified - Plan: POCT Urinalysis Dipstick (Automated), Urine Culture     I discussed the assessment and treatment plan with the patient. The patient was provided an opportunity to ask questions and all were answered. The patient agreed with the plan and demonstrated an understanding of the instructions.   The patient was advised to call back or seek an in-person evaluation if the symptoms worsen or if the condition fails to improve as anticipated.     Review of Systems     Objective:   Physical Exam        Assessment & Plan:

## 2019-02-01 NOTE — Discharge Summary (Signed)
Patient ID: [name] MRN: [id number] DOB/AGE: 1966-06-03 53 y.o.  Admit date: 01/30/2019 Discharge date: 02/01/2019  Admission Diagnoses:  Active Problems:   Low back pain   Discharge Diagnoses:  Active Problems:   Low back pain  status post Procedure(s): Anterior Lumbar Interbody Fusion L5-S1 ABDOMINAL EXPOSURE  Past Medical History:  Diagnosis Date   Anxiety    Asthma    seasonal   Headache    Pelvic pain     Surgeries: Procedure(s): Anterior Lumbar Interbody Fusion L5-S1 ABDOMINAL EXPOSURE on 01/30/2019   Consultants: Vascular assist with approach, otherwise no consults  Discharged Condition: Improved  Hospital Course: [name] is an 53 y.o. female who was admitted 01/30/2019 for operative treatment of L5/S1 Degenerative disc disease. Patient failed conservative treatments (please see the history and physical for the specifics) and had severe unremitting pain that affects sleep, daily activities and work/hobbies. After pre-op clearance, the patient was taken to the operating room on 01/30/2019 and underwent  Procedure(s): Anterior Lumbar Interbody Fusion L5-S1 ABDOMINAL EXPOSURE.    Patient was given perioperative antibiotics:  Anti-infectives (From admission, onward)   Start     Dose/Rate Route Frequency Ordered Stop   01/30/19 1700  ceFAZolin (ANCEF) IVPB 1 g/50 mL premix     1 g 100 mL/hr over 30 Minutes Intravenous Every 8 hours 01/30/19 1413 01/31/19 0053   01/30/19 0652  ceFAZolin (ANCEF) 2-4 GM/100ML-% IVPB    Note to Pharmacy:  Janene Harvey   : cabinet override      01/30/19 0652 01/30/19 0850   01/30/19 0646  ceFAZolin (ANCEF) IVPB 2g/100 mL premix     2 g 200 mL/hr over 30 Minutes Intravenous 30 min pre-op 01/30/19 0646 01/30/19 0850       Patient was given sequential compression devices and early ambulation to prevent DVT.   Patient benefited maximally from hospital stay and there were no complications. At the time of  discharge, the patient was urinating/moving their bowels without difficulty, tolerating a regular diet, pain is controlled with oral pain medications and they have been cleared by PT/OT.   Recent vital signs: No data found.   Recent laboratory studies:  Recent Labs    01/30/19 1424  WBC 12.6*  HGB 12.2  HCT 36.8  PLT 229  CREATININE 0.83     Discharge Medications:   Allergies as of 01/31/2019   No Known Allergies     Medication List    STOP taking these medications   acetaminophen 500 MG tablet Commonly known as:  TYLENOL   ALPRAZolam 1 MG tablet Commonly known as:  XANAX   amoxicillin-clavulanate 875-125 MG tablet Commonly known as:  AUGMENTIN   ibuprofen 800 MG tablet Commonly known as:  ADVIL   Melatonin 3 MG Caps   multivitamin tablet     TAKE these medications   diazepam 5 MG tablet Commonly known as:  VALIUM Take 1 tablet (5 mg total) by mouth every 8 (eight) hours as needed for up to 5 days for anxiety or muscle spasms.   enoxaparin 40 MG/0.4ML injection Commonly known as:  LOVENOX Inject 0.4 mLs (40 mg total) into the skin daily. 10 day supply 1 injection per day   fluticasone 50 MCG/ACT nasal spray Commonly known as:  FLONASE Place 2 sprays into both nostrils daily.   Fluticasone Propionate (Inhal) 100 MCG/BLIST Aepb Commonly known as:  Flovent Diskus INHALE 100 MCG INTO THE LUNGS DAILY. What changed:    how much to  take  how to take this  when to take this  additional instructions   montelukast 10 MG tablet Commonly known as:  SINGULAIR Take 1 tablet (10 mg total) by mouth at bedtime.   ondansetron 4 MG tablet Commonly known as:  Zofran Take 1 tablet (4 mg total) by mouth every 8 (eight) hours as needed for nausea or vomiting.   ProAir HFA 108 (90 Base) MCG/ACT inhaler Generic drug:  albuterol TAKE 2 PUFFS BY MOUTH EVERY 6 HOURS AS NEEDED FOR WHEEZE OR SHORTNESS OF BREATH What changed:  See the new instructions.   rizatriptan 10  MG disintegrating tablet Commonly known as:  MAXALT-MLT Take 1 tablet (10 mg total) by mouth as needed for migraine. May repeat in 2 hours if needed   traMADol 50 MG tablet Commonly known as:  ULTRAM Take 50 mg by mouth 3 (three) times daily as needed.   valACYclovir 500 MG tablet Commonly known as:  VALTREX Take 1 tablet (500 mg total) by mouth 2 (two) times daily. What changed:    when to take this  reasons to take this       Diagnostic Studies: Dg Chest 1 View  Result Date: 01/30/2019 CLINICAL DATA:  Pain EXAM: CHEST  1 VIEW COMPARISON:  None. FINDINGS: The heart size and mediastinal contours are within normal limits. Both lungs are clear. The visualized skeletal structures are unremarkable. There is mild gaseous distention of the partially visualized stomach. IMPRESSION: No active disease. Electronically Signed   By: Katherine Mantle M.D.   On: 01/30/2019 18:02   Dg Lumbar Spine 2-3 Views  Result Date: 01/30/2019 CLINICAL DATA:  Intraoperative imaging for L5-S1 anterior fusion. EXAM: LUMBAR SPINE - 2-3 VIEW; DG C-ARM 61-120 MIN COMPARISON:  None. FINDINGS: Two fluoroscopic spot views of the lower lumbar spine demonstrate anterior plate and screws interbody spacer in place at L5-S1. No acute abnormality. IMPRESSION: Intraoperative imaging for L5-S1 anterior fusion. Electronically Signed   By: Drusilla Kanner M.D.   On: 01/30/2019 13:14   Dg Abd 2 Views  Result Date: 01/30/2019 CLINICAL DATA:  Abdominal pain and distension following spine surgery today EXAM: ABDOMEN - 2 VIEW COMPARISON:  CT abdomen and pelvis 02/03/2017 FINDINGS: Gaseous distention of stomach. Air-filled nondistended loops of small bowel throughout abdomen. Increased stool throughout colon. No bowel dilatation, bowel wall thickening or free air. Bones demineralized. Prior anterior L5-S1 fusion. Surgical clips in the RIGHT upper quadrant from cholecystectomy. IMPRESSION: Increased stool in colon. Gaseous distention of  stomach. Electronically Signed   By: Ulyses Southward M.D.   On: 01/30/2019 18:03   Dg C-arm 1-60 Min  Result Date: 01/30/2019 CLINICAL DATA:  Intraoperative imaging for L5-S1 anterior fusion. EXAM: LUMBAR SPINE - 2-3 VIEW; DG C-ARM 61-120 MIN COMPARISON:  None. FINDINGS: Two fluoroscopic spot views of the lower lumbar spine demonstrate anterior plate and screws interbody spacer in place at L5-S1. No acute abnormality. IMPRESSION: Intraoperative imaging for L5-S1 anterior fusion. Electronically Signed   By: Drusilla Kanner M.D.   On: 01/30/2019 13:14   Vas [place] Lower Extremity Venous (dvt)  Result Date: 01/31/2019  Lower Venous Study Indications: S/p ALIF surgery.  Performing Technologist: Jeb Levering RDMS, RVT  Examination Guidelines: A complete evaluation includes B-mode imaging, spectral Doppler, color Doppler, and power Doppler as needed of all accessible portions of each vessel. Bilateral testing is considered an integral part of a complete examination. Limited examinations for reoccurring indications may be performed as noted.  +---------+---------------+---------+-----------+----------+-------+  RIGHT  Compressibility Phasicity Spontaneity Properties Summary  +---------+---------------+---------+-----------+----------+-------+  CFV       Full            Yes       Yes                             +---------+---------------+---------+-----------+----------+-------+  SFJ       Full                                                      +---------+---------------+---------+-----------+----------+-------+  FV Prox   Full                                                      +---------+---------------+---------+-----------+----------+-------+  FV Mid    Full                                                      +---------+---------------+---------+-----------+----------+-------+  FV Distal Full                                                      +---------+---------------+---------+-----------+----------+-------+   PFV       Full                                                      +---------+---------------+---------+-----------+----------+-------+  POP       Full            Yes       Yes                             +---------+---------------+---------+-----------+----------+-------+  PTV       Full                                                      +---------+---------------+---------+-----------+----------+-------+  PERO      Full                                                      +---------+---------------+---------+-----------+----------+-------+   +---------+---------------+---------+-----------+----------+-------+  LEFT      Compressibility Phasicity Spontaneity Properties Summary  +---------+---------------+---------+-----------+----------+-------+  CFV       Full            Yes       Yes                             +---------+---------------+---------+-----------+----------+-------+  SFJ       Full                                                      +---------+---------------+---------+-----------+----------+-------+  FV Prox   Full                                                      +---------+---------------+---------+-----------+----------+-------+  FV Mid    Full                                                      +---------+---------------+---------+-----------+----------+-------+  FV Distal Full                                                      +---------+---------------+---------+-----------+----------+-------+  PFV       Full                                                      +---------+---------------+---------+-----------+----------+-------+  POP       Full            Yes       Yes                             +---------+---------------+---------+-----------+----------+-------+  PTV       Full                                                      +---------+---------------+---------+-----------+----------+-------+  PERO      Full                                                       +---------+---------------+---------+-----------+----------+-------+     Summary: Right: There is no evidence of deep vein thrombosis in the lower extremity. No cystic structure found in the popliteal fossa. Left: There is no evidence of deep vein thrombosis in the lower extremity. No cystic structure found in the popliteal fossa.  *See table(s) above for measurements and observations. Electronically signed by Lemar Livings MD on 01/31/2019 at 4:29:41 PM.    Final    Dg Or Local Abdomen  Result Date: 01/30/2019 CLINICAL DATA:  Anterior lower lumbar fusion. Looking for retained instruments. EXAM: OR LOCAL ABDOMEN COMPARISON:  CT scan of the abdomen and pelvis dated 02/03/2017 FINDINGS: There are no retained surgical  instruments. Hardware for anterior fusion at L5-S1. Postsurgical air in the left side of the pelvis to the expected degree. Bowel gas pattern is normal. IMPRESSION: No retained surgical instruments after anterior fusion at L5-S1. Report was called to the operating room by Dr. Jena GaussMaxwell at 12:03 p.m. Electronically Signed   By: Francene BoyersJames  Maxwell M.D.   On: 01/30/2019 12:04    Discharge Instructions    Incentive spirometry RT   Complete by:  As directed       Follow-up Information    Venita LickBrooks, Dahari, MD In 2 weeks.   Specialty:  Orthopedic Surgery Why:  If symptoms worsen, For suture removal, For wound re-check Contact information: [address] [place] [address] [phone]           Discharge Plan:  discharge to home  Disposition: Stable    Signed: Leonette MonarchAmanda  N Jovian Lembcke for [facility] Anniebelle Devore PA-C [facility] [phone] 02/01/2019, 12:19 PM

## 2019-02-03 LAB — URINE CULTURE
MICRO NUMBER:: 458707
Result:: NO GROWTH
SPECIMEN QUALITY:: ADEQUATE

## 2019-02-04 ENCOUNTER — Encounter: Payer: Self-pay | Admitting: *Deleted

## 2019-02-05 ENCOUNTER — Encounter: Payer: Self-pay | Admitting: Family Medicine

## 2019-02-06 MED ORDER — CIPROFLOXACIN HCL 500 MG PO TABS: 500.0000 mg | ORAL_TABLET | Freq: Two times a day (BID) | ORAL | 0 refills | Status: DC

## 2019-02-06 NOTE — Telephone Encounter (Signed)
Dr. Fry please advise. Thanks  

## 2019-02-06 NOTE — Telephone Encounter (Signed)
Call in another Cipro #14

## 2019-03-07 ENCOUNTER — Encounter: Payer: Self-pay | Admitting: Family Medicine

## 2019-03-08 NOTE — Telephone Encounter (Signed)
That sounds great. I agree with tapering off the Xanax. I would start by taking 0.5 mg for 2 weeks, then 0.25 mg for 2 weeks, and then stopping.

## 2019-03-08 NOTE — Telephone Encounter (Signed)
Dr. Fry please advise. Thanks  

## 2019-04-04 ENCOUNTER — Encounter: Payer: Self-pay | Admitting: Family Medicine

## 2019-04-04 ENCOUNTER — Other Ambulatory Visit: Payer: Self-pay | Admitting: Family Medicine

## 2019-04-05 NOTE — Telephone Encounter (Signed)
Dr. Fry please advise on refill. thanks 

## 2019-04-08 NOTE — Telephone Encounter (Signed)
Call in Xanax 1 mg to take at bedtime, #30 with 5 rf

## 2019-04-09 ENCOUNTER — Encounter: Payer: Self-pay | Admitting: Family Medicine

## 2019-04-09 NOTE — Telephone Encounter (Signed)
Pt calling to check status. Please advise  [phone]

## 2019-04-10 NOTE — Telephone Encounter (Signed)
Call in #90 with one rf 

## 2019-04-10 NOTE — Telephone Encounter (Signed)
Dr. Fry please advise 

## 2019-04-11 ENCOUNTER — Other Ambulatory Visit: Payer: Self-pay

## 2019-04-11 ENCOUNTER — Ambulatory Visit (INDEPENDENT_AMBULATORY_CARE_PROVIDER_SITE_OTHER): Payer: 59 | Admitting: Mental Health

## 2019-04-11 DIAGNOSIS — F331 Major depressive disorder, recurrent, moderate: Secondary | ICD-10-CM | POA: Diagnosis not present

## 2019-04-11 MED ORDER — ALPRAZOLAM 1 MG PO TABS: 1.0000 mg | ORAL_TABLET | Freq: Two times a day (BID) | ORAL | 5 refills | Status: DC | PRN

## 2019-04-11 NOTE — Telephone Encounter (Signed)
I sent it in myself

## 2019-04-11 NOTE — Progress Notes (Signed)
         [facility] Counselor/Therapist Progress Note   Patient ID: [name], MRN: [id number]  Date: 04/11/2019  Timespent: 50 minutes  Treatment Type: Individual  Mental Status Exam:   Appearance:   Casual     Behavior:  Appropriate  Motor:  Normal  Speech/Language:   Normal Rate  Affect:  Full Range  Mood:  anxious  Thought process:  Coherent and Relevant  Thought content:    Logical  Perceptual disturbances:    Normal  Orientation:  Full (Time, Place, and Person)  Attention:  Good  Concentration:  good  Memory:  Immediate  Fund of knowledge:   Good  Insight:    Good  Judgment:   Good  Impulse Control:  good    Reported Symptoms: Some feelings of depression, anxiety most days, rumination, fatigue, anhedonia, some sleep disturbances  Risk Assessment: Danger to Self:  No Self-injurious Behavior: No Danger to Others: No Duty to Warn:no Physical Aggression / Violence:No  Access to Firearms a concern: No  Gang Involvement:No   Patient / guardian was educated about steps to take if suicide or homicide risk level increases between visits. While future psychiatric events cannot be accurately predicted, the patient does not currently require acute inpatient psychiatric care and does not currently meet [facility] involuntary commitment criteria.  Subjective: Patient engaged in session.  She shared progress and changes.  She stated that she was able to have her needed surgery and is currently in physical therapy.  She stated that her mother was supportive by helping her following the surgery, however, shared some continued relational strain, feels her mother ultimately will focus on herself giving examples.  Ways to communicate in the relationship were explored.  She processed some feelings of needing a stronger support system, does not feel the need to date at this time but, friendships remain but due to the [place] outbreak, she has limited contact.  She shared  how she is gaining strength physically and is able to walk approximately 1 to 2 miles per day separated by resting periods. Patient was encouraged to continue to focus on her health as she has been consistent in this area remains essential.  Encouraged self-care related to sleep hygiene.  Currently in the process of not having 1 of her medications that assisted with sleep.  We explored some strategies to promote improved sleep patterns including diaphragmatic breathing exercises to be utilize between sessions.  Diagnosis:   ICD-10-CM   1. Major depressive disorder, recurrent episode, moderate (HCC)  F33.1     Plan:   1.  Patient to continue to engage in individual counseling 2-4 times a month or as needed. 2.  Patient to identify and apply CBT, coping skills learned in session to decrease depression and anxiety symptoms. 3.  Patient to contact this office, go to the local ED or call 911 if a crisis or emergency develops between visits.  Anson [place], [facility]

## 2019-04-15 NOTE — Telephone Encounter (Signed)
Rx called into [facility] in [place].

## 2019-04-16 NOTE — Telephone Encounter (Signed)
Call in Xanax 0.5 mg to take bid prn anxiety, #60 with 2 rf. Cancel the 1 mg refills

## 2019-04-17 MED ORDER — ALPRAZOLAM 0.5 MG PO TABS: 0.5000 mg | ORAL_TABLET | Freq: Two times a day (BID) | ORAL | 2 refills | Status: DC | PRN

## 2019-04-17 NOTE — Telephone Encounter (Signed)
CMA's are no longer able to call in this mediation. I have pended the Rx. Please send it electronically.

## 2019-04-22 ENCOUNTER — Ambulatory Visit (INDEPENDENT_AMBULATORY_CARE_PROVIDER_SITE_OTHER): Payer: 59 | Admitting: Mental Health

## 2019-04-22 ENCOUNTER — Other Ambulatory Visit: Payer: Self-pay

## 2019-04-22 DIAGNOSIS — F331 Major depressive disorder, recurrent, moderate: Secondary | ICD-10-CM

## 2019-04-22 NOTE — Progress Notes (Signed)
         [facility] Counselor/Therapist Progress Note   Patient ID: [name], MRN: [id number]  Date: 04/22/2019  Timespent: 54 minutes  Treatment Type: Individual  Mental Status Exam:   Appearance:   Casual     Behavior:  Appropriate  Motor:  Normal  Speech/Language:   Normal Rate  Affect:  Full Range  Mood:  anxious  Thought process:  Coherent and Relevant  Thought content:    Logical  Perceptual disturbances:    Normal  Orientation:  Full (Time, Place, and Person)  Attention:  Good  Concentration:  good  Memory:  Immediate  Fund of knowledge:   Good  Insight:    Good  Judgment:   Good  Impulse Control:  good    Reported Symptoms: Some feelings of depression, anxiety most days, rumination, fatigue, anhedonia, some sleep disturbances  Risk Assessment: Danger to Self:  No Self-injurious Behavior: No Danger to Others: No Duty to Warn:no Physical Aggression / Violence:No  Access to Firearms a concern: No  Gang Involvement:No   Patient / guardian was educated about steps to take if suicide or homicide risk level increases between visits. While future psychiatric events cannot be accurately predicted, the patient does not currently require acute inpatient psychiatric care and does not currently meet [facility] involuntary commitment criteria.  Subjective: Patient engaged in session.  She shared progress and changes.  She shared how she continues to attend physical therapy following her surgery.  She stated that she is making strides in the recovery.  Shared some feelings of loneliness and isolation due to the viral pandemic and her continued time out of work.  Continues to have some financial stress and processed some ways she is attempted to cope.  Shared how she plans to possibly see a friend in the next month, however, is unable to do so due to the pandemic.  Patient shared feelings related.  Patient expressed hopes for her future in the coming months,  possibly being able to work again at some point.  She reports some improved sleep patterns.  Continues to set boundaries and some relationships.  Ways to continue to cope and care for herself were explored.  Interventions: CBT, supportive therapy  Diagnosis:   ICD-10-CM   1. Major depressive disorder, recurrent episode, moderate (HCC)  F33.1     Plan:   1.  Patient to continue to engage in individual counseling 2-4 times a month or as needed. 2.  Patient to identify and apply CBT, coping skills learned in session to decrease depression and anxiety symptoms. 3.  Patient to contact this office, go to the local ED or call 911 if a crisis or emergency develops between visits.  Anson [place], [facility]

## 2019-04-30 ENCOUNTER — Ambulatory Visit: Payer: 59 | Admitting: Mental Health

## 2019-05-01 ENCOUNTER — Ambulatory Visit: Payer: 59 | Admitting: Family Medicine

## 2019-05-06 ENCOUNTER — Other Ambulatory Visit: Payer: Self-pay

## 2019-05-06 ENCOUNTER — Ambulatory Visit (INDEPENDENT_AMBULATORY_CARE_PROVIDER_SITE_OTHER): Payer: 59 | Admitting: Mental Health

## 2019-05-06 DIAGNOSIS — F331 Major depressive disorder, recurrent, moderate: Secondary | ICD-10-CM

## 2019-05-06 NOTE — Progress Notes (Signed)
         [facility] Counselor/Therapist Progress Note   Patient ID: JAMMY STLOUIS, MRN: [id number]  Date: 05/06/19  Timespent: 54 minutes  Treatment Type: Individual  Mental Status Exam:   Appearance:   Casual     Behavior:  Appropriate  Motor:  Normal  Speech/Language:   Normal Rate  Affect:  Full Range  Mood:  anxious  Thought process:  Coherent and Relevant  Thought content:    Logical  Perceptual disturbances:    Normal  Orientation:  Full (Time, Place, and Person)  Attention:  Good  Concentration:  good  Memory:  intact  Fund of knowledge:   Good  Insight:    Good  Judgment:   Good  Impulse Control:  good    Reported Symptoms: Some feelings of depression, anxiety most days, rumination, fatigue, anhedonia, some sleep disturbances  Risk Assessment: Danger to Self:  No Self-injurious Behavior: No Danger to Others: No Duty to Warn:no Physical Aggression / Violence:No  Access to Firearms a concern: No  Gang Involvement:No   Patient / guardian was educated about steps to take if suicide or homicide risk level increases between visits. While future psychiatric events cannot be accurately predicted, the patient does not currently require acute inpatient psychiatric care and does not currently meet [facility] involuntary commitment criteria.  Subjective: Patient engaged in session.  Discussed progress.  She continues to cope with fatigue most days due to her medical issues and continued recovery.  She has identified a way to engage in swimming exercise and plans to continue to follow through.  She stated she plans to take it slowly as she has significantly decreased stamina.  Continues to cope with financial stress as she remains out of work although she has applied for jobs.  Provide support as she processed feelings of sadness, anxiety related to the stressors.  Assisted her in identifying positive, hopeful self talk encouraged her to continue to identify  between sessions with continued follow through on self-care such as walking her dog to get out of her house, attending medical appointments, engaging in exercise when possible.  Interventions: CBT, supportive therapy  Diagnosis:   ICD-10-CM   1. Major depressive disorder, recurrent episode, moderate (HCC)  F33.1     Plan:   1.  Patient to continue to engage in individual counseling 2-4 times a month or as needed. 2.  Patient to identify and apply CBT, coping skills learned in session to decrease depression and anxiety symptoms. 3.  Patient to contact this office, go to the local ED or call 911 if a crisis or emergency develops between visits.  Anson [place], [facility]

## 2019-05-10 ENCOUNTER — Other Ambulatory Visit: Payer: Self-pay

## 2019-05-10 ENCOUNTER — Encounter: Payer: Self-pay | Admitting: Psychiatry

## 2019-05-10 ENCOUNTER — Other Ambulatory Visit: Payer: Self-pay | Admitting: Psychiatry

## 2019-05-10 ENCOUNTER — Ambulatory Visit (INDEPENDENT_AMBULATORY_CARE_PROVIDER_SITE_OTHER): Payer: 59 | Admitting: Psychiatry

## 2019-05-10 VITALS — BP 112/62 | HR 73 | Ht 67.0 in | Wt 122.0 lb

## 2019-05-10 DIAGNOSIS — F332 Major depressive disorder, recurrent severe without psychotic features: Secondary | ICD-10-CM

## 2019-05-10 DIAGNOSIS — F419 Anxiety disorder, unspecified: Secondary | ICD-10-CM | POA: Diagnosis not present

## 2019-05-10 MED ORDER — BUPROPION HCL ER (XL) 150 MG PO TB24: 150.0000 mg | ORAL_TABLET | Freq: Every day | ORAL | 2 refills | Status: DC

## 2019-05-10 NOTE — Progress Notes (Signed)
[facility] MD/PA/NP Initial Note  05/12/2019 11:36 AM [name]  MRN:  [id number]  Chief Complaint:  Chief Complaint    Depression; Anxiety      HPI: Pt is a 53 yo female being seen for initial evaluation for tx of depression and anxiety. She is referred by her therapist, [facility] For[facility]ris Andrews, [facility]. She reports long-standing h/o anxiety and depression since being a teenager.   A year ago had a severe accident at work and has been out of work since that time. Had neck and back surgery in May. Was in PT and was discharged 10 days ago and has been having difficulty to do exercises on her own at home.   Seven years ago was engaged and caring for fiance's teenagers since their mother had mental health and substance abuse issues. Relationship ended at the same time of severe discontinuation s/s with Cymbalta to include SI. Reports that she had severe s/s for the following year.   She reports that she has been having increased depression. Mood has been consistently low. She reports that her low energy and motivation and has been neglecting personal hygiene. Reports difficulty with sleep and reports that Alprazolam is helpful for falling asleep. Gabapentin has been helpful for sleep as well. Estimates sleeping about 5-6 hours total. Appetite is slightly decreased. She reports concentration has been "non-existent." Reports that she has not been able to read due to impaired concentration. Some diminished interest and enjoyment in things. Denies SI. Denies SIB.   Reports situational anxiety. Reports that she has taken Alprazolam prn for 6 years. Reports some worry and anxious thoughts. Reports some rumination. Reports rarely needing Xanax prn during the day. Reports that she has had panic attacks with attempts to reduce Xanax. Denies obsessive thoughts or compulsions. Denies social anxiety.   Denies any past manic s/s. Denies paranoia. Denies AH or VH.  Born [place] raised in [place]. Has a younger  brother who lives in [place]. Parents divorced when she was 12. Mother remarried when pt was 53 yo and reports that pt had a positive relationship with her father-in-law. Reports relationship with her mother is "complicated."  Married and then divorced. Reports that she was in a few long-term relationships. Was considering IVF in her late 2730's and was then laid off. Single. Lives alone with her dog. Reports family lives in [place] and moved back to [place] a couple of years ago. Has had increased isolation with the pandemic. 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 Reports that she typically works long days. Describes herself as introverted. Enjoys swimming and yoga.   Past Psychiatric Medication Trials: Gabapentin- prescribed for nerve pain. Needs 600 mg to help with insomnia. Has been helpful for anxiety and pain.  Cymbalta- Gained 20 lbs in 3 months. Had severe discontinuation s/s.  Zoloft- Felt groggy and "spacey." Sexual side effects.  Prozac- felt groggy and spacey. "All I wanted to do was cry." Wellbutrin XL- Has been helpful for depression and has taken it periodically. Took 150 mg po qd. Last took around 2018.  Wellbutrin SR- Felt that 100 mg was ineffective. Alprazolam- Taken prn for 6 years. Diazepam Trazodone- jitteriness. Paradoxical effect.   Visit Diagnosis:    ICD-10-CM   1. Severe episode of recurrent major depressive disorder, without psychotic features (HCC)  F33.2 DISCONTINUED: buPROPion (WELLBUTRIN XL) 150 MG 24 hr tablet  2. Anxiety  F41.9     Past Psychiatric History: Sees Elio ForgetChris Andrews,  LPC. Has also seen a psychologist. Denies any h/o psych hospitalization. Has seen Dr. Evelene CroonKaur in the past.   Past Medical History:  Past Medical History:  Diagnosis Date  . Anxiety   . Asthma    seasonal  . Headache   . Pelvic pain     Past Surgical History:  Procedure Laterality Date  . ABDOMINAL EXPOSURE N/A 01/30/2019   Procedure:  ABDOMINAL EXPOSURE;  Surgeon: Nada LibmanBrabham, Vance W, MD;  [facility];  Service: Vascular;  Laterality: N/A;  . ANTERIOR LUMBAR FUSION N/A 01/30/2019   Procedure: Anterior Lumbar Interbody Fusion L5-S1;  Surgeon: Venita LickBrooks, Dahari, MD;  Location: [facility] OR;  Service: Orthopedics;  Laterality: N/A;  180min  . CERVICAL BIOPSY  W/ LOOP ELECTRODE EXCISION     NOV 2014  . CHOLECYSTECTOMY    . LAPAROSCOPIC SALPINGO OOPHERECTOMY Bilateral 03/16/2017   Procedure: LAPAROSCOPIC SALPINGO OOPHORECTOMY;  Surgeon: [name], Timothy P, MD;  Location: [facility];  Service: Gynecology;  Laterality: Bilateral;  . OPEN REDUCTION INTERNAL FIXATION (ORIF) DISTAL RADIAL FRACTURE Right 05/02/2018   Procedure: OPEN REDUCTION INTERNAL FIXATION (ORIF) DISTAL RADIAL FRACTURE;  Surgeon: Bradly Bienenstockrtmann, Fred, MD;  Location: [facility] OR;  Service: Orthopedics;  Laterality: Right;  . PELVIC LAPAROSCOPY      Family Psychiatric History: Mother takes Wellbutrin XL and Trazodone.   Family History:  Family History  Problem Relation Age of Onset  . Hypertension Mother   . Anxiety disorder Mother   . Depression Mother   . Breast cancer Other        GREAT AUNT    Social History:  Social History   Socioeconomic History  . Marital status: Divorced    Spouse name: Not on file  . Number of children: Not on file  . Years of education: Not on file  . Highest education level: Not on file  Occupational History  . Not on file  Social Needs  . Financial resource strain: Not on file  . Food insecurity    Worry: Not on file    Inability: Not on file  . Transportation needs    Medical: Not on file    Non-medical: Not on file  Tobacco Use  . Smoking status: Never Smoker  . Smokeless tobacco: Never Used  Substance and Sexual Activity  . Alcohol use: Not Currently    Alcohol/week: 1.0 standard drinks    Types: 1 Glasses of wine per week  . Drug use: No  . Sexual activity: Not Currently  Lifestyle  . Physical activity    Days  per week: Not on file    Minutes per session: Not on file  . Stress: Not on file  Relationships  . Social Musicianconnections    Talks on phone: Not on file    Gets together: Not on file    Attends religious service: Not on file    Active member of club or organization: Not on file    Attends meetings of clubs or organizations: Not on file    Relationship status: Not on file  Other Topics Concern  . Not on file  Social History Narrative  . Not on file    Allergies: No Known Allergies  Metabolic Disorder Labs: No results found for: HGBA1C, MPG No results found for: PROLACTIN Lab Results  Component Value Date   CHOL 210 (H) 03/15/2018   TRIG 93.0 03/15/2018   HDL 69.50 03/15/2018   CHOLHDL 3 03/15/2018   VLDL 18.6 03/15/2018   LDLCALC 121 (H)  03/15/2018   Lab Results  Component Value Date   TSH 1.63 03/15/2018   TSH 1.00 04/21/2016    Therapeutic Level Labs: No results found for: LITHIUM No results found for: VALPROATE No components found for:  CBMZ  Current Medications: Current Outpatient Medications  Medication Sig Dispense Refill  . ALPRAZolam (XANAX) 1 MG tablet Take 1 mg by mouth at bedtime.    . fluticasone (FLONASE) 50 MCG/ACT nasal spray Place 2 sprays into both nostrils daily. (Patient taking differently: Place 2 sprays into both nostrils daily as needed. ) 16 g 6  . Fluticasone Propionate, Inhal, (FLOVENT DISKUS) 100 MCG/BLIST AEPB INHALE 100 MCG INTO THE LUNGS DAILY. (Patient taking differently: Take 100 mcg by mouth daily. ) 60 each 5  . gabapentin (NEURONTIN) 300 MG capsule Take 300 mg by mouth 3 (three) times daily.    . montelukast (SINGULAIR) 10 MG tablet Take 1 tablet (10 mg total) by mouth at bedtime. 90 tablet 3  . PROAIR HFA 108 (90 Base) MCG/ACT inhaler TAKE 2 PUFFS BY MOUTH EVERY 6 HOURS AS NEEDED FOR WHEEZE OR SHORTNESS OF BREATH (Patient taking differently: Inhale 2 puffs into the lungs every 6 (six) hours as needed for shortness of breath. ) 8.5  Inhaler 5  . ALPRAZolam (XANAX) 0.5 MG tablet Take 1 tablet (0.5 mg total) by mouth 2 (two) times daily as needed for anxiety. 60 tablet 2  . buPROPion (WELLBUTRIN XL) 150 MG 24 hr tablet TAKE 1 TABLET BY MOUTH EVERY DAY 90 tablet 0  . ciprofloxacin (CIPRO) 500 MG tablet Take 1 tablet (500 mg total) by mouth 2 (two) times daily. 14 tablet 0  . enoxaparin (LOVENOX) 40 MG/0.4ML injection Inject 0.4 mLs (40 mg total) into the skin daily. 10 day supply 1 injection per day (Patient not taking: Reported on 05/10/2019) 10 Syringe 0  . enoxaparin (LOVENOX) 40 MG/0.4ML injection Lovenox 40 mg/0.4 mL subcutaneous syringe  Inject 0.4 mL every day by subcutaneous route as directed for 10 days.    . ondansetron (ZOFRAN) 4 MG tablet Take 1 tablet (4 mg total) by mouth every 8 (eight) hours as needed for nausea or vomiting. (Patient not taking: Reported on 05/10/2019) 20 tablet 0  . ondansetron (ZOFRAN) 4 MG tablet Zofran 4 mg tablet  Take 2 tablets every 8 hours by oral route as needed for 5 days.  Take 1-2 tabs every 8 hours as needed for nausea    . rizatriptan (MAXALT-MLT) 10 MG disintegrating tablet Take 1 tablet (10 mg total) by mouth as needed for migraine. May repeat in 2 hours if needed 30 tablet 3  . traMADol (ULTRAM) 50 MG tablet Take 50 mg by mouth 3 (three) times daily as needed.    . valACYclovir (VALTREX) 500 MG tablet Take 1 tablet (500 mg total) by mouth 2 (two) times daily. (Patient taking differently: Take 500 mg by mouth 2 (two) times daily as needed (outbreak). ) 180 tablet 3   No current facility-administered medications for this visit.     Medication Side Effects: none  Orders placed this visit:  No orders of the defined types were placed in this encounter.   Psychiatric Specialty Exam:  Review of Systems  Musculoskeletal: Positive for back pain.  All other systems reviewed and are negative.   Blood pressure 112/62, pulse 73, height 5\' 7"  (1.702 m), weight 122 lb (55.3 kg), last  menstrual period 02/19/2009.Body mass index is 19.11 kg/m.  General Appearance: Casual  Eye Contact:  Good  Speech:  Clear and Coherent and Normal Rate  Volume:  Normal  Mood:  Anxious and Depressed  Affect:  Depressed and Anxious  Thought Process:  Coherent  Orientation:  Full (Time, Place, and Person)  Thought Content: Logical   Suicidal Thoughts:  No  Homicidal Thoughts:  No  Memory:  WNL  Judgement:  Good  Insight:  Good  Psychomotor Activity:  Normal  Concentration:  Concentration: Good  Recall:  Good  Fund of Knowledge: Good  Language: Good  Assets:  Communication Skills Desire for Improvement Resilience  ADL's:  Intact  Cognition: WNL  Prognosis:  Good   Screenings:  PHQ2-9     Office Visit from 06/13/2017 in [place] at [facility] Total Score  0      Receiving Psychotherapy: Yes   Treatment Plan/Recommendations: Pt seen for 60 minutes and greater than 50% of the visit spent counseling pt re: mood and anxiety s/s and potential tx options. Discussed different mechanisms of action of different medications and potential benefits, risks, and side effects. Discussed re-starting Wellbutrin XL since this has been effective and well tolerated in the past for current target s/s to include depressed mood, low energy, and low motivation. Recommend continuing Alprazolam 1 mg po QHS for anxiety and insomnia. Discussed that this provider could assume management of Alprazolam in the future if she and her PCP prefer this. Recommend continuing gabapentin as prescribed by surgeon. Discussed that this provider could continue gabapentin for anxiety when gabapentin is no longer indicated for pain since pt reports that gabapentin has been helpful for her anxiety. Recommend continuing therapy with Lanetta Inch, LPC. Pt to f/u in 4 weeks or sooner if clinically indicated. Patient advised to contact office with any questions, adverse effects, or acute worsening in signs and  symptoms.     [name], PMHNP

## 2019-05-14 ENCOUNTER — Ambulatory Visit (INDEPENDENT_AMBULATORY_CARE_PROVIDER_SITE_OTHER): Payer: 59 | Admitting: Mental Health

## 2019-05-14 ENCOUNTER — Other Ambulatory Visit: Payer: Self-pay

## 2019-05-14 DIAGNOSIS — F331 Major depressive disorder, recurrent, moderate: Secondary | ICD-10-CM | POA: Diagnosis not present

## 2019-05-14 DIAGNOSIS — F419 Anxiety disorder, unspecified: Secondary | ICD-10-CM | POA: Diagnosis not present

## 2019-05-14 NOTE — Progress Notes (Signed)
         [facility] Counselor/Therapist Progress Note   Patient ID: [name], MRN: [id number]  Date: 05/14/2019  Timespent: 54 minutes  Treatment Type: Individual  Mental Status Exam:   Appearance:   Casual     Behavior:  Appropriate  Motor:  Normal  Speech/Language:   Normal Rate  Affect:  Full Range  Mood:  anxious, depressed, pleasant  Thought process:  Coherent and Relevant  Thought content:    Logical  Perceptual disturbances:    Normal  Orientation:  Full (Time, Place, and Person)  Attention:  Good  Concentration:  good  Memory:  Immediate  Fund of knowledge:   Good  Insight:    Good  Judgment:   Good  Impulse Control:  good    Reported Symptoms: Some feelings of depression, anxiety most days, rumination, fatigue, anhedonia, some sleep disturbances  Risk Assessment: Danger to Self:  No Self-injurious Behavior: No Danger to Others: No Duty to Warn:no Physical Aggression / Violence:No  Access to Firearms a concern: No  Gang Involvement:No   Patient / guardian was educated about steps to take if suicide or homicide risk level increases between visits. While future psychiatric events cannot be accurately predicted, the patient does not currently require acute inpatient psychiatric care and does not currently meet [facility] involuntary commitment criteria.  Subjective: Patient engaged in session discussing progress since last visit.  She shared how she continues to cope with low motivation, anxiety and depressed mood.  She continues to struggle financially as she has been out of work and she also continues to cope with pain associated with her injuries.  She shared how she is applying for a position with the [employer] and hopes to get an interview.  Ways she is attempted to increase her motivation and her overall mood was shared.  We continue to explore ways she is effectively taken some steps towards self-care and cope.  She has been able to get out at  times to go for walks with her dog and has also recently started trying to get in a pool and swim.  She shared how she is sore today and reminds herself to give herself time to adjust.  She continues to set boundaries and some relationships, no recent significant relational strain with her mother.  Assisted her in identifying calming self talk to utilize to cope between sessions.  Interventions: CBT, supportive therapy  Diagnosis:   ICD-10-CM   1. Major depressive disorder, recurrent episode, moderate (HCC)  F33.1   2. Anxiety  F41.9     Plan:   1.  Patient to continue to engage in individual counseling 2-4 times a month or as needed. 2.  Patient to identify and apply CBT, coping skills learned in session to decrease depression and anxiety symptoms. 3.  Patient to contact this office, go to the local ED or call 911 if a crisis or emergency develops between visits.  Anson [place], [facility]

## 2019-05-20 ENCOUNTER — Ambulatory Visit: Payer: 59 | Admitting: Mental Health

## 2019-05-21 ENCOUNTER — Other Ambulatory Visit: Payer: Self-pay

## 2019-05-21 ENCOUNTER — Ambulatory Visit (INDEPENDENT_AMBULATORY_CARE_PROVIDER_SITE_OTHER): Payer: 59 | Admitting: Mental Health

## 2019-05-21 DIAGNOSIS — F331 Major depressive disorder, recurrent, moderate: Secondary | ICD-10-CM | POA: Diagnosis not present

## 2019-05-21 NOTE — Progress Notes (Signed)
         [facility] Counselor/Therapist Progress Note   Patient ID: [name], MRN: [id number]  Date: 05/21/2019  Timespent: 54 minutes  Treatment Type: Individual  Mental Status Exam:   Appearance:   Casual     Behavior:  Appropriate  Motor:  Normal  Speech/Language:   Normal Rate  Affect:  Full Range  Mood:  anxious, depressed, pleasant  Thought process:  Coherent and Relevant  Thought content:    Logical  Perceptual disturbances:    Normal  Orientation:  Full (Time, Place, and Person)  Attention:  Good  Concentration:  good  Memory:  Immediate  Fund of knowledge:   Good  Insight:    Good  Judgment:   Good  Impulse Control:  good    Reported Symptoms: Some feelings of depression, anxiety most days, rumination, fatigue, anhedonia, some sleep disturbances  Risk Assessment: Danger to Self:  No Self-injurious Behavior: No Danger to Others: No Duty to Warn:no Physical Aggression / Violence:No  Access to Firearms a concern: No  Gang Involvement:No   Patient / guardian was educated about steps to take if suicide or homicide risk level increases between visits. While future psychiatric events cannot be accurately predicted, the patient does not currently require acute inpatient psychiatric care and does not currently meet [facility] involuntary commitment criteria.  Subjective: Patient engaged in session discussing progress since last visit.  She continues to make attempts to exercise carefully swimming at a local pool.  She continues to allow herself to gradually attempt to increase strength and exercise due to her continued recovery.  She shared the relationship with her mother which remains strained at times.  She shared some recent specifics related to how her mother can attempt to triangulate relationships where patient feels she is often a focus of negativity instigated by her mother's comments.  Patient was able to identify and process her own self  believes she has worked to establish versus her mother's views / negative statements.  Patient shared history relating to childhood and adolescence giving some specific examples of how her mother's behaviors affected her.  Patient feels that she is struggled academically due to possible learning delays but was never assessed.  Patient shared her struggle with attention, focus and distractibility throughout her life and her attempts to cope.  We continue to explore identify ways to care for herself daily.  She is taking medications as prescribed from [name], NP at this practice and feels they have been helpful thus far.    Interventions: CBT, supportive therapy  Diagnosis:   ICD-10-CM   1. Major depressive disorder, recurrent episode, moderate (HCC)  F33.1     Plan:   1.  Patient to continue to engage in individual counseling 2-4 times a month or as needed. 2.  Patient to identify and apply CBT, coping skills learned in session to decrease depression and anxiety symptoms. 3.  Patient to contact this office, go to the local ED or call 911 if a crisis or emergency develops between visits.  Anson [place], [facility]

## 2019-05-24 ENCOUNTER — Other Ambulatory Visit: Payer: Self-pay

## 2019-05-24 ENCOUNTER — Encounter: Payer: Self-pay | Admitting: Psychiatry

## 2019-05-24 ENCOUNTER — Ambulatory Visit (INDEPENDENT_AMBULATORY_CARE_PROVIDER_SITE_OTHER): Payer: 59 | Admitting: Psychiatry

## 2019-05-24 VITALS — BP 91/61 | HR 78

## 2019-05-24 DIAGNOSIS — F419 Anxiety disorder, unspecified: Secondary | ICD-10-CM

## 2019-05-24 DIAGNOSIS — F331 Major depressive disorder, recurrent, moderate: Secondary | ICD-10-CM

## 2019-05-24 MED ORDER — GABAPENTIN 300 MG PO CAPS: ORAL_CAPSULE | ORAL | 0 refills | Status: DC

## 2019-05-24 MED ORDER — HYDROXYZINE HCL 10 MG PO TABS: 10.0000 mg | ORAL_TABLET | Freq: Three times a day (TID) | ORAL | 0 refills | Status: DC | PRN

## 2019-05-24 NOTE — Progress Notes (Signed)
[name] [id number] 12/11/1965 53 y.o.  Subjective:   Patient ID:  [name] is a 53 y.o. (DOB [birth year]) female.  Chief Complaint:  Chief Complaint  Patient presents with  . Anxiety  . Depression    HPI [name] presents to the office today for follow-up of depression. She reports "the Wellbutrin is definitely helping with the depression and the depression fog." She has noticed more anxiety since depression has improved. She reports that Gabapentin has helped with anxiety. Reports that she has also had some increased situational stressors. She reports that she continues to experience depression. Awakened with a panic attack last night at 3 am. Reports that she had difficulty returning to sleep. Reports that she has not been able to sleep without medication. Appetite has been ok and has not experienced decreased appetite. Energy and motivation are fair. She reports that she has had chronic difficulty with concentration. Reports some difficulty formulating her thoughts. Reports feeling overwhelmed thinking about what she needs to accomplish. Reports that her "mindless spending" has decreased. She reports that she is completing more chores. Denies SI.   Past Psychiatric Medication Trials: Gabapentin- prescribed for nerve pain. Needs 600 mg to help with insomnia. Has been helpful for anxiety and pain.  Cymbalta- Gained 20 lbs in 3 months. Had severe discontinuation s/s.  Zoloft- Felt groggy and "spacey." Sexual side effects.  Prozac- felt groggy and spacey. "All I wanted to do was cry." Wellbutrin XL- Has been helpful for depression and has taken it periodically. Took 150 mg po qd. Last took around 2018.  Wellbutrin SR- Felt that 100 mg was ineffective. Alprazolam- Taken prn for 6 years. Diazepam Trazodone- jitteriness. Paradoxical effect.  Hydroxyzine- effective for anxiety   Review of Systems:  Review of Systems  Gastrointestinal: Positive for  constipation.  Musculoskeletal: Negative for gait problem.  Neurological: Negative for tremors.  Psychiatric/Behavioral:       Please refer to HPI    Medications: I have reviewed the patient's current medications.  Current Outpatient Medications  Medication Sig Dispense Refill  . ALPRAZolam (XANAX) 0.5 MG tablet Take 1 tablet (0.5 mg total) by mouth 2 (two) times daily as needed for anxiety. 60 tablet 2  . ALPRAZolam (XANAX) 1 MG tablet Take 1 mg by mouth at bedtime.    . ASHWAGANDHA PO Take by mouth.    Marland Kitchen buPROPion (WELLBUTRIN XL) 150 MG 24 hr tablet TAKE 1 TABLET BY MOUTH EVERY DAY 90 tablet 0  . enoxaparin (LOVENOX) 40 MG/0.4ML injection Inject 0.4 mLs (40 mg total) into the skin daily. 10 day supply 1 injection per day 10 Syringe 0  . enoxaparin (LOVENOX) 40 MG/0.4ML injection Lovenox 40 mg/0.4 mL subcutaneous syringe  Inject 0.4 mL every day by subcutaneous route as directed for 10 days.    . fluticasone (FLONASE) 50 MCG/ACT nasal spray Place 2 sprays into both nostrils daily. (Patient taking differently: Place 2 sprays into both nostrils daily as needed. ) 16 g 6  . Fluticasone Propionate, Inhal, (FLOVENT DISKUS) 100 MCG/BLIST AEPB INHALE 100 MCG INTO THE LUNGS DAILY. (Patient taking differently: Take 100 mcg by mouth daily. ) 60 each 5  . gabapentin (NEURONTIN) 300 MG capsule Take 1 capsule po twice daily and 2 capsules at bedtime 120 capsule 0  . montelukast (SINGULAIR) 10 MG tablet Take 1 tablet (10 mg total) by mouth at bedtime. 90 tablet 3  . ondansetron (ZOFRAN) 4 MG tablet Take 1 tablet (4 mg total) by mouth every  8 (eight) hours as needed for nausea or vomiting. 20 tablet 0  . ondansetron (ZOFRAN) 4 MG tablet Zofran 4 mg tablet  Take 2 tablets every 8 hours by oral route as needed for 5 days.  Take 1-2 tabs every 8 hours as needed for nausea    . PROAIR HFA 108 (90 Base) MCG/ACT inhaler TAKE 2 PUFFS BY MOUTH EVERY 6 HOURS AS NEEDED FOR WHEEZE OR SHORTNESS OF BREATH (Patient  taking differently: Inhale 2 puffs into the lungs every 6 (six) hours as needed for shortness of breath. ) 8.5 Inhaler 5  . rizatriptan (MAXALT-MLT) 10 MG disintegrating tablet Take 1 tablet (10 mg total) by mouth as needed for migraine. May repeat in 2 hours if needed 30 tablet 3  . traMADol (ULTRAM) 50 MG tablet Take 50 mg by mouth 3 (three) times daily as needed.    . valACYclovir (VALTREX) 500 MG tablet Take 1 tablet (500 mg total) by mouth 2 (two) times daily. (Patient taking differently: Take 500 mg by mouth 2 (two) times daily as needed (outbreak). ) 180 tablet 3  . ciprofloxacin (CIPRO) 500 MG tablet Take 1 tablet (500 mg total) by mouth 2 (two) times daily. 14 tablet 0  . hydrOXYzine (ATARAX/VISTARIL) 10 MG tablet Take 1 tablet (10 mg total) by mouth 3 (three) times daily as needed. 30 tablet 0   No current facility-administered medications for this visit.     Medication Side Effects: None  Allergies: No Known Allergies  Past Medical History:  Diagnosis Date  . Anxiety   . Asthma    seasonal  . Headache   . Pelvic pain     Family History  Problem Relation Age of Onset  . Hypertension Mother   . Anxiety disorder Mother   . Depression Mother   . Breast cancer Other        GREAT AUNT    Social History   Socioeconomic History  . Marital status: Divorced    Spouse name: Not on file  . Number of children: Not on file  . Years of education: Not on file  . Highest education level: Not on file  Occupational History  . Not on file  Social Needs  . Financial resource strain: Not on file  . Food insecurity    Worry: Not on file    Inability: Not on file  . Transportation needs    Medical: Not on file    Non-medical: Not on file  Tobacco Use  . Smoking status: Never Smoker  . Smokeless tobacco: Never Used  Substance and Sexual Activity  . Alcohol use: Not Currently    Alcohol/week: 1.0 standard drinks    Types: 1 Glasses of wine per week  . Drug use: No  . Sexual  activity: Not Currently  Lifestyle  . Physical activity    Days per week: Not on file    Minutes per session: Not on file  . Stress: Not on file  Relationships  . Social Musicianconnections    Talks on phone: Not on file    Gets together: Not on file    Attends religious service: Not on file    Active member of club or organization: Not on file    Attends meetings of clubs or organizations: Not on file    Relationship status: Not on file  . Intimate partner violence    Fear of current or ex partner: Not on file    Emotionally abused: Not on file  Physically abused: Not on file    Forced sexual activity: Not on file  Other Topics Concern  . Not on file  Social History Narrative  . Not on file    Past Medical History, Surgical history, Social history, and Family history were reviewed and updated as appropriate.   Please see review of systems for further details on the patient's review from today.   Objective:   Physical Exam:  BP 91/61   Pulse 78   LMP 02/19/2009 (LMP Unknown)   Physical Exam Constitutional:      General: She is not in acute distress.    Appearance: She is well-developed.  Musculoskeletal:        General: No deformity.  Neurological:     Mental Status: She is alert and oriented to person, place, and time.     Coordination: Coordination normal.  Psychiatric:        Attention and Perception: Attention and perception normal. She does not perceive auditory or visual hallucinations.        Mood and Affect: Mood is anxious. Affect is not labile, blunt, angry or inappropriate.        Speech: Speech normal.        Behavior: Behavior normal.        Thought Content: Thought content normal. Thought content does not include homicidal or suicidal ideation. Thought content does not include homicidal or suicidal plan.        Cognition and Memory: Cognition and memory normal.        Judgment: Judgment normal.     Comments: Mood presents as less depressed compared to  previous exam Insight intact. No delusions.      Lab Review:     Component Value Date/Time   NA 136 01/23/2019 1459   K 3.6 01/23/2019 1459   CL 107 01/23/2019 1459   CO2 21 (L) 01/23/2019 1459   GLUCOSE 86 01/23/2019 1459   BUN 13 01/23/2019 1459   CREATININE 0.83 01/30/2019 1424   CREATININE 0.83 03/13/2017 1211   CALCIUM 8.8 (L) 01/23/2019 1459   PROT 7.1 03/15/2018 0846   ALBUMIN 4.3 03/15/2018 0846   AST 13 03/15/2018 0846   ALT 9 03/15/2018 0846   ALKPHOS 97 03/15/2018 0846   BILITOT 0.3 03/15/2018 0846   GFRNONAA >60 01/30/2019 1424   GFRAA >60 01/30/2019 1424       Component Value Date/Time   WBC 12.6 (H) 01/30/2019 1424   RBC 4.06 01/30/2019 1424   HGB 12.2 01/30/2019 1424   HCT 36.8 01/30/2019 1424   PLT 229 01/30/2019 1424   MCV 90.6 01/30/2019 1424   MCH 30.0 01/30/2019 1424   MCHC 33.2 01/30/2019 1424   RDW 13.2 01/30/2019 1424   LYMPHSABS 1.3 03/15/2018 0846   MONOABS 0.5 03/15/2018 0846   EOSABS 0.1 03/15/2018 0846   BASOSABS 0.0 03/15/2018 0846    No results found for: POCLITH, LITHIUM   No results found for: PHENYTOIN, PHENOBARB, VALPROATE, CBMZ   .res Assessment: Plan:   Will increase gabapentin to 300 mg twice daily and 600 mg at bedtime to improve anxiety and insomnia.  Discussed that further titration may be appropriate to adequately control anxiety signs and symptoms. Will restart Hydroxyzine and patient reports that this has been helpful for her anxiety in the past.  Discussed potential benefits, risks, and side effects of hydroxyzine. Discussed that more time is needed to determine full response to recent initiation of Wellbutrin XL 150 g daily, however there has  been a partial response and depressive signs and symptoms already.  Will therefore continue Wellbutrin XL 150 mg p.o. every morning.  Recommended continuing psychotherapy with Elio Forget, LPC. Patient to follow-up with this provider in 2 to 3 weeks or sooner if clinically  indicated. Patient advised to contact office with any questions, adverse effects, or acute worsening in signs and symptoms.  [name] was seen today for anxiety and depression.  Diagnoses and all orders for this visit:  Anxiety -     gabapentin (NEURONTIN) 300 MG capsule; Take 1 capsule po twice daily and 2 capsules at bedtime -     hydrOXYzine (ATARAX/VISTARIL) 10 MG tablet; Take 1 tablet (10 mg total) by mouth 3 (three) times daily as needed.  Major depressive disorder, recurrent episode, moderate (HCC)     Please see After Visit Summary for patient specific instructions.  Future Appointments  Date Time Provider Department Center  05/28/2019 10:00 AM Waldron Session, [facility] CP-CP None  06/05/2019 10:00 AM Waldron Session, [facility]-CP None  06/11/2019 10:00 AM Waldron Session, [facility] CP-CP None  06/11/2019 11:00 AM Corie Chiquito, PMHNP CP-CP None  06/19/2019 10:00 AM Waldron Session, [facility] CP-CP None    No orders of the defined types were placed in this encounter.   -------------------------------

## 2019-05-27 ENCOUNTER — Ambulatory Visit: Payer: 59 | Admitting: Mental Health

## 2019-05-28 ENCOUNTER — Ambulatory Visit (INDEPENDENT_AMBULATORY_CARE_PROVIDER_SITE_OTHER): Payer: 59 | Admitting: Mental Health

## 2019-05-28 ENCOUNTER — Other Ambulatory Visit: Payer: Self-pay

## 2019-05-28 DIAGNOSIS — F331 Major depressive disorder, recurrent, moderate: Secondary | ICD-10-CM | POA: Diagnosis not present

## 2019-05-28 NOTE — Progress Notes (Signed)
         [facility] Counselor/Therapist Progress Note   Patient ID: [name], MRN: [id number]  Date: 05/29/2019  Timespent: 54 minutes  Treatment Type: Individual  Mental Status Exam:   Appearance:   Casual     Behavior:  Appropriate  Motor:  Normal  Speech/Language:   Normal Rate  Affect:  Full Range  Mood:  anxious, depressed, pleasant  Thought process:  Coherent and Relevant  Thought content:    Logical  Perceptual disturbances:    Normal  Orientation:  Full (Time, Place, and Person)  Attention:  Good  Concentration:  good  Memory:  Immediate  Fund of knowledge:   Good  Insight:    Good  Judgment:   Good  Impulse Control:  good    Reported Symptoms: Some feelings of depression, anxiety most days, rumination, fatigue, anhedonia, some sleep disturbances  Risk Assessment: Danger to Self:  No Self-injurious Behavior: No Danger to Others: No Duty to Warn:no Physical Aggression / Violence:No  Access to Firearms a concern: No  Gang Involvement:No   Patient / guardian was educated about steps to take if suicide or homicide risk level increases between visits. While future psychiatric events cannot be accurately predicted, the patient does not currently require acute inpatient psychiatric care and does not currently meet [facility] involuntary commitment criteria.  Subjective: Patient arrived on time for today's session.  She shared that continued stress in her life related to financial and medical issues.  She shared how she has been unable to utilize some of her support system.  She shared how she has a long friendship history with 1 of her closest friends in which she cannot visit due to the viral pandemic.  She continues to make some attempts to get out when possible, for appointments and to engage in swimming exercise at times.  Processed some feelings related to the relationship with her mother.  Continue to share some relevant history related to how she  has had to establish and maintain boundaries in this relationship.  Ways to clearly communicate and be assertive in this relationship for identified as a need and further identified how to put into practice when needed.  We explored calming self talk due to some anxiety related to being uncertain about her future related to employment and financial security.  Encouraged her to continue between sessions.   Interventions: CBT, supportive therapy  Diagnosis:   ICD-10-CM   1. Major depressive disorder, recurrent episode, moderate (HCC)  F33.1     Plan:   1.  Patient to continue to engage in individual counseling 2-4 times a month or as needed. 2.  Patient to identify and apply CBT, coping skills learned in session to decrease depression and anxiety symptoms. 3.  Patient to contact this office, go to the local ED or call 911 if a crisis or emergency develops between visits.  Anson [place], [facility]

## 2019-06-05 ENCOUNTER — Ambulatory Visit: Payer: 59 | Admitting: Mental Health

## 2019-06-11 ENCOUNTER — Encounter: Payer: Self-pay | Admitting: Psychiatry

## 2019-06-11 ENCOUNTER — Other Ambulatory Visit: Payer: Self-pay

## 2019-06-11 ENCOUNTER — Ambulatory Visit (INDEPENDENT_AMBULATORY_CARE_PROVIDER_SITE_OTHER): Payer: 59 | Admitting: Mental Health

## 2019-06-11 ENCOUNTER — Ambulatory Visit (INDEPENDENT_AMBULATORY_CARE_PROVIDER_SITE_OTHER): Payer: 59 | Admitting: Psychiatry

## 2019-06-11 VITALS — BP 102/66 | HR 69

## 2019-06-11 DIAGNOSIS — F419 Anxiety disorder, unspecified: Secondary | ICD-10-CM | POA: Diagnosis not present

## 2019-06-11 DIAGNOSIS — F332 Major depressive disorder, recurrent severe without psychotic features: Secondary | ICD-10-CM | POA: Diagnosis not present

## 2019-06-11 DIAGNOSIS — G4700 Insomnia, unspecified: Secondary | ICD-10-CM

## 2019-06-11 DIAGNOSIS — F331 Major depressive disorder, recurrent, moderate: Secondary | ICD-10-CM | POA: Diagnosis not present

## 2019-06-11 MED ORDER — GABAPENTIN 300 MG PO CAPS: ORAL_CAPSULE | ORAL | 1 refills | Status: DC

## 2019-06-11 MED ORDER — BUPROPION HCL ER (XL) 150 MG PO TB24: 300.0000 mg | ORAL_TABLET | Freq: Every day | ORAL | 0 refills | Status: DC

## 2019-06-11 NOTE — Progress Notes (Signed)
[name] [id number] [birth year] 53 y.o.  Subjective:   Patient ID:  [name] is a 53 y.o. (DOB 08-Dec-1965) female.  Chief Complaint:  Chief Complaint  Patient presents with  . Depression  . Anxiety  . Insomnia    HPI [name] presents to the office today for follow-up of depression and anxiety. Pt reports, "I'm still fighting depression." Reports she continues to experience depressed mood and low motivation. Energy remains low. Has been falling behind on errands and household chores. She reports that her sleep has been ok- "not great, but ok." Appetite has been the same. She reports concentration remains impaired- "I'm all over the map." Reports frequent anxious thoughts and thinking about "all the unknowns." Able to fall asleep with Xanax and Gabapentin and wakes up around 3 am with anxious thoughts. Reports that she is able to turn off anxious thoughts at times. Notices some mild physical s/s with increased anxiety at times with increased heart rate and chest tightness. Denies SI.  Reports feelings of frustration with continuing to have significant pain.    Past Psychiatric Medication Trials: Gabapentin- prescribed for nerve pain. Needs 600 mg to help with insomnia. Has been helpful for anxiety and pain.  Cymbalta- Gained 20 lbs in 3 months. Had severe discontinuation s/s.  Zoloft- Felt groggy and "spacey." Sexual side effects.  Prozac- felt groggy and spacey. "All I wanted to do was cry." Wellbutrin XL- Has been helpful for depression and has taken it periodically. Took 150 mg po qd. Last took around 2018.  Wellbutrin SR- Felt that 100 mg was ineffective. Alprazolam- Taken prn for 6 years. Diazepam Trazodone- jitteriness. Paradoxical effect. Hydroxyzine- effective for anxiety   Review of Systems:  Review of Systems  Musculoskeletal: Positive for back pain. Negative for gait problem.  Neurological: Negative for tremors.   Psychiatric/Behavioral:       Please refer to HPI    Medications: I have reviewed the patient's current medications.  Current Outpatient Medications  Medication Sig Dispense Refill  . ALPRAZolam (XANAX) 0.5 MG tablet Take 1 tablet (0.5 mg total) by mouth 2 (two) times daily as needed for anxiety. 60 tablet 2  . ASHWAGANDHA PO Take by mouth.    Marland Kitchen buPROPion (WELLBUTRIN XL) 150 MG 24 hr tablet Take 2 tablets (300 mg total) by mouth daily. 180 tablet 0  . fluticasone (FLONASE) 50 MCG/ACT nasal spray Place 2 sprays into both nostrils daily. (Patient taking differently: Place 2 sprays into both nostrils daily as needed. ) 16 g 6  . Fluticasone Propionate, Inhal, (FLOVENT DISKUS) 100 MCG/BLIST AEPB INHALE 100 MCG INTO THE LUNGS DAILY. (Patient taking differently: Take 100 mcg by mouth daily. ) 60 each 5  . gabapentin (NEURONTIN) 300 MG capsule Take 1 capsule po TID and 2 capsules at bedtime 150 capsule 1  . hydrOXYzine (ATARAX/VISTARIL) 10 MG tablet Take 1 tablet (10 mg total) by mouth 3 (three) times daily as needed. 30 tablet 0  . montelukast (SINGULAIR) 10 MG tablet Take 1 tablet (10 mg total) by mouth at bedtime. 90 tablet 3  . ondansetron (ZOFRAN) 4 MG tablet Take 1 tablet (4 mg total) by mouth every 8 (eight) hours as needed for nausea or vomiting. 20 tablet 0  . ondansetron (ZOFRAN) 4 MG tablet Zofran 4 mg tablet  Take 2 tablets every 8 hours by oral route as needed for 5 days.  Take 1-2 tabs every 8 hours as needed for nausea    . PROAIR HFA  108 (90 Base) MCG/ACT inhaler TAKE 2 PUFFS BY MOUTH EVERY 6 HOURS AS NEEDED FOR WHEEZE OR SHORTNESS OF BREATH (Patient taking differently: Inhale 2 puffs into the lungs every 6 (six) hours as needed for shortness of breath. ) 8.5 Inhaler 5  . rizatriptan (MAXALT-MLT) 10 MG disintegrating tablet Take 1 tablet (10 mg total) by mouth as needed for migraine. May repeat in 2 hours if needed 30 tablet 3  . ALPRAZolam (XANAX) 1 MG tablet Take 1 mg by mouth at  bedtime.    . ciprofloxacin (CIPRO) 500 MG tablet Take 1 tablet (500 mg total) by mouth 2 (two) times daily. 14 tablet 0  . enoxaparin (LOVENOX) 40 MG/0.4ML injection Inject 0.4 mLs (40 mg total) into the skin daily. 10 day supply 1 injection per day (Patient not taking: Reported on 06/11/2019) 10 Syringe 0  . enoxaparin (LOVENOX) 40 MG/0.4ML injection Lovenox 40 mg/0.4 mL subcutaneous syringe  Inject 0.4 mL every day by subcutaneous route as directed for 10 days.    . traMADol (ULTRAM) 50 MG tablet Take 50 mg by mouth 3 (three) times daily as needed.    . valACYclovir (VALTREX) 500 MG tablet Take 1 tablet (500 mg total) by mouth 2 (two) times daily. (Patient taking differently: Take 500 mg by mouth 2 (two) times daily as needed (outbreak). ) 180 tablet 3   No current facility-administered medications for this visit.     Medication Side Effects: None  Allergies: No Known Allergies  Past Medical History:  Diagnosis Date  . Anxiety   . Asthma    seasonal  . Headache   . Pelvic pain     Family History  Problem Relation Age of Onset  . Hypertension Mother   . Anxiety disorder Mother   . Depression Mother   . Breast cancer Other        GREAT AUNT    Social History   Socioeconomic History  . Marital status: Divorced    Spouse name: Not on file  . Number of children: Not on file  . Years of education: Not on file  . Highest education level: Not on file  Occupational History  . Not on file  Social Needs  . Financial resource strain: Not on file  . Food insecurity    Worry: Not on file    Inability: Not on file  . Transportation needs    Medical: Not on file    Non-medical: Not on file  Tobacco Use  . Smoking status: Never Smoker  . Smokeless tobacco: Never Used  Substance and Sexual Activity  . Alcohol use: Not Currently    Alcohol/week: 1.0 standard drinks    Types: 1 Glasses of wine per week  . Drug use: No  . Sexual activity: Not Currently  Lifestyle  .  Physical activity    Days per week: Not on file    Minutes per session: Not on file  . Stress: Not on file  Relationships  . Social Musicianconnections    Talks on phone: Not on file    Gets together: Not on file    Attends religious service: Not on file    Active member of club or organization: Not on file    Attends meetings of clubs or organizations: Not on file    Relationship status: Not on file  . Intimate partner violence    Fear of current or ex partner: Not on file    Emotionally abused: Not on file  Physically abused: Not on file    Forced sexual activity: Not on file  Other Topics Concern  . Not on file  Social History Narrative  . Not on file    Past Medical History, Surgical history, Social history, and Family history were reviewed and updated as appropriate.   Please see review of systems for further details on the patient's review from today.   Objective:   Physical Exam:  BP 102/66   Pulse 69   LMP 02/19/2009 (LMP Unknown)   Physical Exam Constitutional:      General: She is not in acute distress.    Appearance: She is well-developed.  Musculoskeletal:        General: No deformity.  Neurological:     Mental Status: She is alert and oriented to person, place, and time.     Coordination: Coordination normal.  Psychiatric:        Attention and Perception: Attention and perception normal. She does not perceive auditory or visual hallucinations.        Mood and Affect: Mood is anxious and depressed. Affect is not labile, blunt, angry or inappropriate.        Speech: Speech normal.        Behavior: Behavior normal.        Thought Content: Thought content normal. Thought content does not include homicidal or suicidal ideation. Thought content does not include homicidal or suicidal plan.        Cognition and Memory: Cognition and memory normal.        Judgment: Judgment normal.     Comments: Insight intact. No delusions.      Lab Review:     Component Value  Date/Time   NA 136 01/23/2019 1459   K 3.6 01/23/2019 1459   CL 107 01/23/2019 1459   CO2 21 (L) 01/23/2019 1459   GLUCOSE 86 01/23/2019 1459   BUN 13 01/23/2019 1459   CREATININE 0.83 01/30/2019 1424   CREATININE 0.83 03/13/2017 1211   CALCIUM 8.8 (L) 01/23/2019 1459   PROT 7.1 03/15/2018 0846   ALBUMIN 4.3 03/15/2018 0846   AST 13 03/15/2018 0846   ALT 9 03/15/2018 0846   ALKPHOS 97 03/15/2018 0846   BILITOT 0.3 03/15/2018 0846   GFRNONAA >60 01/30/2019 1424   GFRAA >60 01/30/2019 1424       Component Value Date/Time   WBC 12.6 (H) 01/30/2019 1424   RBC 4.06 01/30/2019 1424   HGB 12.2 01/30/2019 1424   HCT 36.8 01/30/2019 1424   PLT 229 01/30/2019 1424   MCV 90.6 01/30/2019 1424   MCH 30.0 01/30/2019 1424   MCHC 33.2 01/30/2019 1424   RDW 13.2 01/30/2019 1424   LYMPHSABS 1.3 03/15/2018 0846   MONOABS 0.5 03/15/2018 0846   EOSABS 0.1 03/15/2018 0846   BASOSABS 0.0 03/15/2018 0846    No results found for: POCLITH, LITHIUM   No results found for: PHENYTOIN, PHENOBARB, VALPROATE, CBMZ   .res Assessment: Plan:   Discussed potential benefits, risks, and side effects of increasing Wellbutrin XL for depression and Gabapentin for anxiety. Pt agrees to medication changes.   Will increase Wellbutrin XL to 300 mg po qd for depression. Pt to contact office if she experiences worsening anxiety, insomnia, or irritability.  Will increase Gabapentin to 300 mg po TID and 600 mg po QHS for anxiety and insomnia.  Recommend continuing psychotherapy with Elio Forget, LPC.  Pt to f/u with this provider in 4 weeks or sooner if clinically indicated.  Patient advised to contact office with any questions, adverse effects, or acute worsening in signs and symptoms.  Tracy OreChristina was seen today for depression, anxiety and insomnia.  Diagnoses and all orders for this visit:  Severe episode of recurrent major depressive disorder, without psychotic features (HCC) -     buPROPion (WELLBUTRIN  XL) 150 MG 24 hr tablet; Take 2 tablets (300 mg total) by mouth daily.  Anxiety -     gabapentin (NEURONTIN) 300 MG capsule; Take 1 capsule po TID and 2 capsules at bedtime  Insomnia, unspecified type     Please see After Visit Summary for patient specific instructions.  Future Appointments  Date Time Provider Department Center  06/19/2019 10:00 AM [facility]ron SessionAndrews, [facility] None  06/25/2019  9:00 AM [facility]  07/02/2019 10:00 AM [facility] SessionAndrews, [facility]    No orders of the defined types were placed in this encounter.   -------------------------------

## 2019-06-11 NOTE — Progress Notes (Signed)
         [facility] Counselor/Therapist Progress Note   Patient ID: [name], MRN: [id number]  Date: 06/11/2019  Timespent: 51 minutes  Treatment Type: Individual  Mental Status Exam:   Appearance:   Casual     Behavior:  Appropriate  Motor:  Normal  Speech/Language:   Normal Rate  Affect:  Full Range  Mood:  anxious, depressed, pleasant  Thought process:  Coherent and Relevant  Thought content:    Logical  Perceptual disturbances:    Normal  Orientation:  Full (Time, Place, and Person)  Attention:  Good  Concentration:  good  Memory:  Immediate  Fund of knowledge:   Good  Insight:    Good  Judgment:   Good  Impulse Control:  good    Reported Symptoms: Some feelings of depression, anxiety most days, rumination, fatigue, anhedonia, some sleep disturbances  Risk Assessment: Danger to Self:  No Self-injurious Behavior: No Danger to Others: No Duty to Warn:no Physical Aggression / Violence:No  Access to Firearms a concern: No  Gang Involvement:No   Patient / guardian was educated about steps to take if suicide or homicide risk level increases between visits. While future psychiatric events cannot be accurately predicted, the patient does not currently require acute inpatient psychiatric care and does not currently meet [facility] involuntary commitment criteria.  Subjective: Patient arrived on time for today's session.  Patient shared recent progress and events.  She shared how she continues to cope with chronic pain levels.  She shared how she was visiting with a friend and due to sitting for too long began to notice elevating pain levels.  She is still very concerned about her future, her ability to work at functional levels due to her medical issues and subsequent pain levels.  Time was spent allowing her to process the various relationships in her life some of which are supportive and others which are more distressing to her.  Continues to have  relationship strain with her mother where she continues to set boundaries as needed.  Time was spent allowing her to process feelings related, provided support, understanding as well as ways to care for herself.  She continues to make efforts when possible to engage in some swimming exercise.  We encouraged her to continue when possible.  Interventions: CBT, supportive therapy  Diagnosis:   ICD-10-CM   1. Major depressive disorder, recurrent episode, moderate (HCC)  F33.1     Plan:   1.  Patient to continue to engage in individual counseling 2-4 times a month or as needed. 2.  Patient to identify and apply CBT, coping skills learned in session to decrease depression and anxiety symptoms. 3.  Patient to contact this office, go to the local ED or call 911 if a crisis or emergency develops between visits.  Anson Oregon, [facility]

## 2019-06-15 ENCOUNTER — Other Ambulatory Visit: Payer: Self-pay | Admitting: Psychiatry

## 2019-06-15 DIAGNOSIS — F419 Anxiety disorder, unspecified: Secondary | ICD-10-CM

## 2019-06-19 ENCOUNTER — Ambulatory Visit (INDEPENDENT_AMBULATORY_CARE_PROVIDER_SITE_OTHER): Payer: 59 | Admitting: Mental Health

## 2019-06-19 ENCOUNTER — Encounter: Payer: Self-pay | Admitting: Gynecology

## 2019-06-19 ENCOUNTER — Other Ambulatory Visit: Payer: Self-pay

## 2019-06-19 DIAGNOSIS — F331 Major depressive disorder, recurrent, moderate: Secondary | ICD-10-CM

## 2019-06-19 NOTE — Progress Notes (Signed)
         [facility] Counselor/Therapist Progress Note   Patient ID: [name], MRN: [id number]  Date: 06/19/2019  Timespent: 50 minutes  Treatment Type: Individual  Mental Status Exam:   Appearance:   Casual     Behavior:  Appropriate  Motor:  Normal  Speech/Language:   Normal Rate  Affect:  Full Range  Mood:  anxious, depressed, pleasant  Thought process:  Coherent and Relevant  Thought content:    Logical  Perceptual disturbances:    Normal  Orientation:  Full (Time, Place, and Person)  Attention:  Good  Concentration:  good  Memory:  Immediate  Fund of knowledge:   Good  Insight:    Good  Judgment:   Good  Impulse Control:  good    Reported Symptoms: Some feelings of depression, anxiety most days, rumination, fatigue, anhedonia, some sleep disturbances  Risk Assessment: Danger to Self:  No Self-injurious Behavior: No Danger to Others: No Duty to Warn:no Physical Aggression / Violence:No  Access to Firearms a concern: No  Gang Involvement:No   Patient / guardian was educated about steps to take if suicide or homicide risk level increases between visits. While future psychiatric events cannot be accurately predicted, the patient does not currently require acute inpatient psychiatric care and does not currently meet [facility] involuntary commitment criteria.  Subjective: Patient arrived on time for today's session.  She reports a recent back pain.  She tries to get enough rest when her back begins to have increased pain.  We reviewed diaphragmatic breathing with mindfulness concepts to manage stress, pain levels.  Discussed relationships, specifically with out of her mother.  She shared a recent interaction where she had to set boundaries with her mother.  She shared how her mother will often project herself as a victim in their relationship to other family members.  Patient shared the various relationships with other family members and how this is  changed their relationship dynamic.  Patient is able to clarify some statements her mother can make from time to time about her with some of the closer family members as needed.  She shared her attempts to problem solve to navigate options as she continues to have financial stress.  Undecided about her future due to her medical conditions she makes attempts to plan and set goals.  We reviewed self-care in the areas of attempting exercise when possible and reaching out to her supports.  Interventions: CBT, supportive therapy  Diagnosis:   ICD-10-CM   1. Major depressive disorder, recurrent episode, moderate (HCC)  F33.1     Plan:   1.  Patient to continue to engage in individual counseling 2-4 times a month or as needed. 2.  Patient to identify and apply CBT, coping skills learned in session to decrease depression and anxiety symptoms. 3.  Patient to contact this office, go to the local ED or call 911 if a crisis or emergency develops between visits.  Anson [place], [facility]

## 2019-06-25 ENCOUNTER — Ambulatory Visit: Payer: 59 | Admitting: Mental Health

## 2019-07-02 ENCOUNTER — Ambulatory Visit: Payer: 59 | Admitting: Mental Health

## 2019-07-03 ENCOUNTER — Telehealth: Payer: Self-pay | Admitting: Family Medicine

## 2019-07-03 NOTE — Telephone Encounter (Signed)
Spoke with patient. Informed her that we do not draw labs for other doctors. Patient verbalized understanding. Patient made an appointment to discuss with Dr. Sarajane Jews so he can be in the loop.

## 2019-07-03 NOTE — Telephone Encounter (Signed)
Copied from [place] [id number]. Topic: General - Other >> Jul 02, 2019  1:36 PM Keene Breath wrote: Reason for CRM: Patient called to ask the nurse to call her regarding some lab work that she needs drawn.  Patient stated that she has the paperwork with her from her other doctor.  Please call patient so she can discuss what needs to be done.  CB# [phone] >> Jul 03, 2019  7:59 AM Leota Jacobsen T wrote: Called pt and was unable to leave a msg >> Jul 02, 2019  2:25 PM Cox, Melburn Hake, CMA wrote: We do not draw labs for outside providers. Patient can go to whichever lab is on the orders, [facility] or [place].

## 2019-07-04 ENCOUNTER — Telehealth (INDEPENDENT_AMBULATORY_CARE_PROVIDER_SITE_OTHER): Payer: 59 | Admitting: Family Medicine

## 2019-07-04 ENCOUNTER — Other Ambulatory Visit: Payer: Self-pay | Admitting: Family Medicine

## 2019-07-04 ENCOUNTER — Encounter: Payer: Self-pay | Admitting: Family Medicine

## 2019-07-04 ENCOUNTER — Other Ambulatory Visit: Payer: Self-pay

## 2019-07-04 DIAGNOSIS — M545 Low back pain, unspecified: Secondary | ICD-10-CM

## 2019-07-04 DIAGNOSIS — G8929 Other chronic pain: Secondary | ICD-10-CM

## 2019-07-04 NOTE — Progress Notes (Signed)
Virtual Visit via Video Note  I connected with the patient on 07/04/19 at 10:00 AM EDT by a video enabled telemedicine application and verified that I am speaking with the correct person using two identifiers.  Location patient: home Location provider:work or home office Persons participating in the virtual visit: patient, provider  I discussed the limitations of evaluation and management by telemedicine and the availability of in person appointments. The patient expressed understanding and agreed to proceed.   HPI: Here asking for assistance on labwork. She had lumbar fusion surgery in May per Dr. Melina Schools, and she recovered well and went through the usual PT. However since then she has continued to have intermittent Love back pain that radiates to the legs when she sits for long periods. Standing and walking do not bother her. She was referred to Dr. Suella Broad, and he is planning on performing a nerve block procedure. Prior to that they wanted some labs to be drawn, and they had her ask me to do this. In addition to being out of work on disability, [name] is dealing with a fair amount of depression, and she is seeing [name] NP at [facility] for this.    ROS: See pertinent positives and negatives per HPI.  Past Medical History:  Diagnosis Date  . Anxiety   . Asthma    seasonal  . Headache   . Pelvic pain     Past Surgical History:  Procedure Laterality Date  . ABDOMINAL EXPOSURE N/A 01/30/2019   Procedure: ABDOMINAL EXPOSURE;  Surgeon: Serafina Mitchell, MD;  Location: [facility] OR;  Service: Vascular;  Laterality: N/A;  . ANTERIOR LUMBAR FUSION N/A 01/30/2019   Procedure: Anterior Lumbar Interbody Fusion L5-S1;  Surgeon: Melina Schools, MD;  Location: [place];  Service: Orthopedics;  Laterality: N/A;  163min  . CERVICAL BIOPSY  W/ LOOP ELECTRODE EXCISION     NOV 2014  . CHOLECYSTECTOMY    . LAPAROSCOPIC SALPINGO OOPHERECTOMY Bilateral 03/16/2017   Procedure: LAPAROSCOPIC  SALPINGO OOPHORECTOMY;  Surgeon: Anastasio Auerbach, MD;  Location: [place];  Service: Gynecology;  Laterality: Bilateral;  . OPEN REDUCTION INTERNAL FIXATION (ORIF) DISTAL RADIAL FRACTURE Right 05/02/2018   Procedure: OPEN REDUCTION INTERNAL FIXATION (ORIF) DISTAL RADIAL FRACTURE;  Surgeon: Iran Planas, MD;  Location: [place];  Service: Orthopedics;  Laterality: Right;  . PELVIC LAPAROSCOPY      Family History  Problem Relation Age of Onset  . Hypertension Mother   . Anxiety disorder Mother   . Depression Mother   . Breast cancer Other        GREAT AUNT     Current Outpatient Medications:  .  ALPRAZolam (XANAX) 0.5 MG tablet, Take 1 tablet (0.5 mg total) by mouth 2 (two) times daily as needed for anxiety., Disp: 60 tablet, Rfl: 2 .  ALPRAZolam (XANAX) 1 MG tablet, Take 1 mg by mouth at bedtime., Disp: , Rfl:  .  ASHWAGANDHA PO, Take by mouth., Disp: , Rfl:  .  buPROPion (WELLBUTRIN XL) 150 MG 24 hr tablet, Take 2 tablets (300 mg total) by mouth daily., Disp: 180 tablet, Rfl: 0 .  enoxaparin (LOVENOX) 40 MG/0.4ML injection, Lovenox 40 mg/0.4 mL subcutaneous syringe  Inject 0.4 mL every day by subcutaneous route as directed for 10 days., Disp: , Rfl:  .  fluticasone (FLONASE) 50 MCG/ACT nasal spray, Place 2 sprays into both nostrils daily. (Patient taking differently: Place 2 sprays into both nostrils daily as needed. ), Disp: 16 g, Rfl:  6 .  Fluticasone Propionate, Inhal, (FLOVENT DISKUS) 100 MCG/BLIST AEPB, INHALE 100 MCG INTO THE LUNGS DAILY. (Patient taking differently: Take 100 mcg by mouth daily. ), Disp: 60 each, Rfl: 5 .  gabapentin (NEURONTIN) 300 MG capsule, Take 1 capsule po TID and 2 capsules at bedtime, Disp: 150 capsule, Rfl: 1 .  hydrOXYzine (ATARAX/VISTARIL) 10 MG tablet, TAKE 1 TABLET BY MOUTH THREE TIMES A DAY AS NEEDED, Disp: 30 tablet, Rfl: 0 .  montelukast (SINGULAIR) 10 MG tablet, Take 1 tablet (10 mg total) by mouth at bedtime., Disp: 90 tablet, Rfl:  3 .  ondansetron (ZOFRAN) 4 MG tablet, Take 1 tablet (4 mg total) by mouth every 8 (eight) hours as needed for nausea or vomiting., Disp: 20 tablet, Rfl: 0 .  ondansetron (ZOFRAN) 4 MG tablet, Zofran 4 mg tablet  Take 2 tablets every 8 hours by oral route as needed for 5 days.  Take 1-2 tabs every 8 hours as needed for nausea, Disp: , Rfl:  .  PROAIR HFA 108 (90 Base) MCG/ACT inhaler, TAKE 2 PUFFS BY MOUTH EVERY 6 HOURS AS NEEDED FOR WHEEZE OR SHORTNESS OF BREATH (Patient taking differently: Inhale 2 puffs into the lungs every 6 (six) hours as needed for shortness of breath. ), Disp: 8.5 Inhaler, Rfl: 5 .  rizatriptan (MAXALT-MLT) 10 MG disintegrating tablet, Take 1 tablet (10 mg total) by mouth as needed for migraine. May repeat in 2 hours if needed, Disp: 30 tablet, Rfl: 3 .  traMADol (ULTRAM) 50 MG tablet, Take 50 mg by mouth 3 (three) times daily as needed., Disp: , Rfl:  .  valACYclovir (VALTREX) 500 MG tablet, Take 1 tablet (500 mg total) by mouth 2 (two) times daily. (Patient taking differently: Take 500 mg by mouth 2 (two) times daily as needed (outbreak). ), Disp: 180 tablet, Rfl: 3 .  ciprofloxacin (CIPRO) 500 MG tablet, Take 1 tablet (500 mg total) by mouth 2 (two) times daily., Disp: 14 tablet, Rfl: 0 .  enoxaparin (LOVENOX) 40 MG/0.4ML injection, Inject 0.4 mLs (40 mg total) into the skin daily. 10 day supply 1 injection per day (Patient not taking: Reported on 06/11/2019), Disp: 10 Syringe, Rfl: 0  EXAM:  VITALS per patient if applicable:  GENERAL: alert, oriented, appears well and in no acute distress  HEENT: atraumatic, conjunttiva clear, no obvious abnormalities on inspection of external nose and ears  NECK: normal movements of the head and neck  LUNGS: on inspection no signs of respiratory distress, breathing rate appears normal, no obvious gross SOB, gasping or wheezing  CV: no obvious cyanosis  MS: moves all visible extremities without noticeable abnormality  PSYCH/NEURO:  pleasant and cooperative, no obvious depression or anxiety, speech and thought processing grossly intact  ASSESSMENT AND PLAN: Love back pain. I am glad she is getting the nerve block, and I told her that the chances of this being successful are excellent. We will order a BMET and relay those results to Dr. Ethelene Hal.  Gershon Crane, MD  Discussed the following assessment and plan:  Chronic bilateral Love back pain without sciatica - Plan: Basic metabolic panel     I discussed the assessment and treatment plan with the patient. The patient was provided an opportunity to ask questions and all were answered. The patient agreed with the plan and demonstrated an understanding of the instructions.   The patient was advised to call back or seek an in-person evaluation if the symptoms worsen or if the condition fails to improve as  anticipated.

## 2019-07-09 ENCOUNTER — Other Ambulatory Visit: Payer: Self-pay

## 2019-07-09 ENCOUNTER — Ambulatory Visit (INDEPENDENT_AMBULATORY_CARE_PROVIDER_SITE_OTHER): Payer: 59 | Admitting: Mental Health

## 2019-07-09 ENCOUNTER — Encounter: Payer: Self-pay | Admitting: Psychiatry

## 2019-07-09 ENCOUNTER — Ambulatory Visit (INDEPENDENT_AMBULATORY_CARE_PROVIDER_SITE_OTHER): Payer: 59 | Admitting: Psychiatry

## 2019-07-09 VITALS — BP 97/73 | HR 87

## 2019-07-09 DIAGNOSIS — G4700 Insomnia, unspecified: Secondary | ICD-10-CM

## 2019-07-09 DIAGNOSIS — F331 Major depressive disorder, recurrent, moderate: Secondary | ICD-10-CM

## 2019-07-09 DIAGNOSIS — F419 Anxiety disorder, unspecified: Secondary | ICD-10-CM

## 2019-07-09 MED ORDER — GABAPENTIN 400 MG PO CAPS: ORAL_CAPSULE | ORAL | 1 refills | Status: DC

## 2019-07-09 MED ORDER — BUPROPION HCL ER (XL) 150 MG PO TB24: 300.0000 mg | ORAL_TABLET | Freq: Every day | ORAL | 0 refills | Status: DC

## 2019-07-09 MED ORDER — HYDROXYZINE HCL 10 MG PO TABS: 10.0000 mg | ORAL_TABLET | Freq: Three times a day (TID) | ORAL | 0 refills | Status: DC | PRN

## 2019-07-09 NOTE — Progress Notes (Signed)
[name] [id number] [birth year] 53 y.o.  Subjective:   Patient ID:  [name] is a 53 y.o. (DOB Jan 09, 1966) female.  Chief Complaint:  Chief Complaint  Patient presents with  . Anxiety  . Depression    HPI [name] presents to the office today for follow-up of depression, anxiety, and insomnia.   She reports that she has been doing well with Wellbutrin XL in terms of mood. She has noticed some improvement in concentration and self-care. Energy and motivation have improved some. Has been walking more and doing PT. Reports that mood is less depressed, however she continues to have some depression in response to her situation and pain. She reports adequate sleep. Reports that her appetite "ebbs and flows." Denies SI.   She reports that Gabapentin has been helpful for her anxiety and has had some partial improvement in pain s/s. She reports that her anxiety "comes and goes. It's definitely better than it was." Continues to have some worry about the future. Occ panic s/s. Denies recent panic attacks.   Past Psychiatric Medication Trials: Gabapentin- prescribed for nerve pain. Needs 600 mg to help with insomnia. Has been helpful for anxiety and pain.  Cymbalta- Gained 20 lbs in 3 months. Had severe discontinuation s/s.  Zoloft- Felt groggy and "spacey." Sexual side effects.  Prozac- felt groggy and spacey. "All I wanted to do was cry." Wellbutrin XL- Has been helpful for depression and has taken it periodically. Took 150 mg po qd. Last took around 2018.  Wellbutrin SR- Felt that 100 mg was ineffective. Alprazolam- Taken prn for 6 years. Diazepam Trazodone- jitteriness. Paradoxical effect. Hydroxyzine- effective for anxiety  Review of Systems:  Review of Systems  Gastrointestinal: Negative.   Musculoskeletal: Positive for back pain. Negative for gait problem.  Neurological: Negative for tremors and headaches.  Psychiatric/Behavioral:       Please  refer to HPI   Reports that she has been found to have a nerve issue in her spine and has been scheduled for a nerve block. She reports that her Ortho specialist recommended considering increase in Gabapentin for nerve pain.    Medications: I have reviewed the patient's current medications.  Current Outpatient Medications  Medication Sig Dispense Refill  . ALPRAZolam (XANAX) 0.5 MG tablet Take 1 tablet (0.5 mg total) by mouth 2 (two) times daily as needed for anxiety. 60 tablet 2  . ASHWAGANDHA PO Take by mouth.    Marland Kitchen buPROPion (WELLBUTRIN XL) 150 MG 24 hr tablet Take 2 tablets (300 mg total) by mouth daily. 180 tablet 0  . fluticasone (FLONASE) 50 MCG/ACT nasal spray SPRAY 2 SPRAYS INTO EACH NOSTRIL EVERY DAY 48 mL 2  . Fluticasone Propionate, Inhal, (FLOVENT DISKUS) 100 MCG/BLIST AEPB INHALE 100 MCG INTO THE LUNGS DAILY. (Patient taking differently: Take 100 mcg by mouth daily. ) 60 each 5  . hydrOXYzine (ATARAX/VISTARIL) 10 MG tablet Take 1 tablet (10 mg total) by mouth 3 (three) times daily as needed. 30 tablet 0  . montelukast (SINGULAIR) 10 MG tablet Take 1 tablet (10 mg total) by mouth at bedtime. 90 tablet 3  . ondansetron (ZOFRAN) 4 MG tablet Take 1 tablet (4 mg total) by mouth every 8 (eight) hours as needed for nausea or vomiting. 20 tablet 0  . ondansetron (ZOFRAN) 4 MG tablet Zofran 4 mg tablet  Take 2 tablets every 8 hours by oral route as needed for 5 days.  Take 1-2 tabs every 8 hours as needed for nausea    .  PROAIR HFA 108 (90 Base) MCG/ACT inhaler TAKE 2 PUFFS BY MOUTH EVERY 6 HOURS AS NEEDED FOR WHEEZE OR SHORTNESS OF BREATH (Patient taking differently: Inhale 2 puffs into the lungs every 6 (six) hours as needed for shortness of breath. ) 8.5 Inhaler 5  . rizatriptan (MAXALT-MLT) 10 MG disintegrating tablet Take 1 tablet (10 mg total) by mouth as needed for migraine. May repeat in 2 hours if needed 30 tablet 3  . traMADol (ULTRAM) 50 MG tablet Take 50 mg by mouth 3 (three)  times daily as needed.    . valACYclovir (VALTREX) 500 MG tablet Take 1 tablet (500 mg total) by mouth 2 (two) times daily. (Patient taking differently: Take 500 mg by mouth 2 (two) times daily as needed (outbreak). ) 180 tablet 3  . ALPRAZolam (XANAX) 1 MG tablet Take 1 mg by mouth at bedtime.    . ciprofloxacin (CIPRO) 500 MG tablet Take 1 tablet (500 mg total) by mouth 2 (two) times daily. 14 tablet 0  . enoxaparin (LOVENOX) 40 MG/0.4ML injection Inject 0.4 mLs (40 mg total) into the skin daily. 10 day supply 1 injection per day (Patient not taking: Reported on 06/11/2019) 10 Syringe 0  . enoxaparin (LOVENOX) 40 MG/0.4ML injection Lovenox 40 mg/0.4 mL subcutaneous syringe  Inject 0.4 mL every day by subcutaneous route as directed for 10 days.    Marland Kitchen. gabapentin (NEURONTIN) 400 MG capsule Take 1 capsule po TID and 2 caps po QHS 150 capsule 1   No current facility-administered medications for this visit.     Medication Side Effects: Other: Some difficulty with sleep initiation if she takes Wellbutrin XL too late.   Allergies: No Known Allergies  Past Medical History:  Diagnosis Date  . Anxiety   . Asthma    seasonal  . Headache   . Pelvic pain     Family History  Problem Relation Age of Onset  . Hypertension Mother   . Anxiety disorder Mother   . Depression Mother   . Breast cancer Other        GREAT AUNT    Social History   Socioeconomic History  . Marital status: Divorced    Spouse name: Not on file  . Number of children: Not on file  . Years of education: Not on file  . Highest education level: Not on file  Occupational History  . Not on file  Social Needs  . Financial resource strain: Not on file  . Food insecurity    Worry: Not on file    Inability: Not on file  . Transportation needs    Medical: Not on file    Non-medical: Not on file  Tobacco Use  . Smoking status: Never Smoker  . Smokeless tobacco: Never Used  Substance and Sexual Activity  . Alcohol use:  Not Currently    Alcohol/week: 1.0 standard drinks    Types: 1 Glasses of wine per week  . Drug use: No  . Sexual activity: Not Currently  Lifestyle  . Physical activity    Days per week: Not on file    Minutes per session: Not on file  . Stress: Not on file  Relationships  . Social Musicianconnections    Talks on phone: Not on file    Gets together: Not on file    Attends religious service: Not on file    Active member of club or organization: Not on file    Attends meetings of clubs or organizations: Not on file  Relationship status: Not on file  . Intimate partner violence    Fear of current or ex partner: Not on file    Emotionally abused: Not on file    Physically abused: Not on file    Forced sexual activity: Not on file  Other Topics Concern  . Not on file  Social History Narrative  . Not on file    Past Medical History, Surgical history, Social history, and Family history were reviewed and updated as appropriate.   Please see review of systems for further details on the patient's review from today.   Objective:   Physical Exam:  BP 97/73   Pulse 87   LMP 02/19/2009 (LMP Unknown)   Physical Exam Constitutional:      General: She is not in acute distress.    Appearance: She is well-developed.  Musculoskeletal:        General: No deformity.  Neurological:     Mental Status: She is alert and oriented to person, place, and time.     Coordination: Coordination normal.  Psychiatric:        Attention and Perception: Attention and perception normal. She does not perceive auditory or visual hallucinations.        Mood and Affect: Mood is anxious. Affect is not labile, blunt, angry or inappropriate.        Speech: Speech normal.        Behavior: Behavior normal.        Thought Content: Thought content normal. Thought content is not paranoid or delusional. Thought content does not include homicidal or suicidal ideation. Thought content does not include homicidal or  suicidal plan.        Cognition and Memory: Cognition and memory normal.        Judgment: Judgment normal.     Comments: Mood presents as less depressed Insight intact. No delusions.      Lab Review:     Component Value Date/Time   NA 136 01/23/2019 1459   K 3.6 01/23/2019 1459   CL 107 01/23/2019 1459   CO2 21 (L) 01/23/2019 1459   GLUCOSE 86 01/23/2019 1459   BUN 13 01/23/2019 1459   CREATININE 0.83 01/30/2019 1424   CREATININE 0.83 03/13/2017 1211   CALCIUM 8.8 (L) 01/23/2019 1459   PROT 7.1 03/15/2018 0846   ALBUMIN 4.3 03/15/2018 0846   AST 13 03/15/2018 0846   ALT 9 03/15/2018 0846   ALKPHOS 97 03/15/2018 0846   BILITOT 0.3 03/15/2018 0846   GFRNONAA >60 01/30/2019 1424   GFRAA >60 01/30/2019 1424       Component Value Date/Time   WBC 12.6 (H) 01/30/2019 1424   RBC 4.06 01/30/2019 1424   HGB 12.2 01/30/2019 1424   HCT 36.8 01/30/2019 1424   PLT 229 01/30/2019 1424   MCV 90.6 01/30/2019 1424   MCH 30.0 01/30/2019 1424   MCHC 33.2 01/30/2019 1424   RDW 13.2 01/30/2019 1424   LYMPHSABS 1.3 03/15/2018 0846   MONOABS 0.5 03/15/2018 0846   EOSABS 0.1 03/15/2018 0846   BASOSABS 0.0 03/15/2018 0846    No results found for: POCLITH, LITHIUM   No results found for: PHENYTOIN, PHENOBARB, VALPROATE, CBMZ   .res Assessment: Plan:   Agree with orthopedic specialist regarding potential benefits with higher doses of gabapentin in terms of both pain and anxiety, particularly since she has not been experiencing any tolerability issues with gabapentin.  Patient expresses some concern about possible increased drowsiness with increasing dosage.  Discussed that  gabapentin may be gradually increased since she continues to have 300 mg capsules remaining.  Discussed gradually transitioning over to 400 mg capsules and may take up to 400 mg 3 times daily and 800 mg at bedtime for anxiety and to possibly improvement of pain. Will continue Wellbutrin XL for depression. Recommend  continuing alprazolam. Recommend continuing psychotherapy with [name], LPC. Patient to follow-up with this provider in 2 months or sooner if clinically indicated. Patient advised to contact office with any questions, adverse effects, or acute worsening in signs and symptoms.  Hadja was seen today for anxiety and depression.  Diagnoses and all orders for this visit:  Anxiety -     gabapentin (NEURONTIN) 400 MG capsule; Take 1 capsule po TID and 2 caps po QHS -     hydrOXYzine (ATARAX/VISTARIL) 10 MG tablet; Take 1 tablet (10 mg total) by mouth 3 (three) times daily as needed.  Insomnia, unspecified type -     gabapentin (NEURONTIN) 400 MG capsule; Take 1 capsule po TID and 2 caps po QHS  Moderate recurrent major depression (HCC) -     buPROPion (WELLBUTRIN XL) 150 MG 24 hr tablet; Take 2 tablets (300 mg total) by mouth daily.     Please see After Visit Summary for patient specific instructions.  Future Appointments  Date Time Provider Department Center  07/09/2019 11:00 AM Waldron Session, [facility] None  07/16/2019 10:00 AM Waldron Session, [facility] None  07/23/2019 10:00 AM Waldron Session, [facility] CP-[facility] None  09/10/2019 10:30 AM Corie Chiquito, PMHNP CP-CP None    No orders of the defined types were placed in this encounter.   -------------------------------

## 2019-07-09 NOTE — Progress Notes (Signed)
         [facility] Counselor/Therapist Progress Note   Patient ID: [name], MRN: [id number]  Date: 07/14/2019  Timespent: 51 minutes  Treatment Type: Individual therapy  Mental Status Exam:   Appearance:   Casual     Behavior:  Appropriate  Motor:  Normal  Speech/Language:   Normal Rate, rhythm   Affect:  Full Range  Mood:  anxious, depressed, pleasant, engaging  Thought process:  Coherent and Relevant  Thought content:    Logical, goal directed  Perceptual disturbances:    Normal  Orientation:  Full (Time, Place, and Person)  Attention:  Good  Concentration:  good  Memory:  intact  Fund of knowledge:   Good  Insight:    Good  Judgment:   Good  Impulse Control:  good    Reported Symptoms:  Depressed mood, anxiety most days, rumination, fatigue, anhedonia, some sleep disturbances  Risk Assessment: Danger to Self:  No Self-injurious Behavior: No Danger to Others: No Duty to Warn:no Physical Aggression / Violence:No  Access to Firearms a concern: No  Gang Involvement:No   Patient / guardian was educated about steps to take if suicide or homicide risk level increases between visits. While future psychiatric events cannot be accurately predicted, the patient does not currently require acute inpatient psychiatric care and does not currently meet [facility] involuntary commitment criteria.  Subjective: Patient arrived on time for today's session.  She shared progress, specifically how she had to set boundaries and limits with her mother.  She shared how this relationship continues to be stressful at times.  She stated that she also spoke with her aunt who has been influenced by her mother in a way that can portray patient in an unfavorable manner.  This is been a frequent pattern of her mother for many years.  Patient was able to be assertive in sharing the struggle of her significant medical issues and ongoing financial stress to bring clarity to the  situation.  Patient shared how she continues to cope with significant pain levels and she continues to go to her doctor appointments.  Provide support as she processed feelings related to this ongoing struggle.  Through guided discovery, assisted patient in identifying how she has been able to advocate for herself while also coping with many significant stressors over the last year.  Encouraged patient to identify self strengths specifically in these areas between sessions.   Interventions: CBT, supportive therapy  Diagnosis:   ICD-10-CM   1. Major depressive disorder, recurrent episode, moderate ([place])  F33.1      Plan: Patient is to use CBT and coping skills to help manage her symptoms more effectively therefore improving her functioning evidenced by having a more hopeful thoughts about her future, utilizing her support system when possible, reporting decreased rumination with improved sleep patterns. She will increase her assertiveness in advocating for her medical and employment needs. She will improve setting realistic boundaries when needs in relationships.  (progressing)   Long-term goal:   Identify the major life complexities from the past and present that form the basis for present feelings of anxiety and depression. Short-term goal:  Enhance ability to handle effectively maintain self care while coping with pain associated with medical conditions. Patient will improve her        Positive, realistic and idealistic self talk to keep feelings of anxiety and depression more infrequent.      Anson [place], [facility]

## 2019-07-16 ENCOUNTER — Ambulatory Visit: Payer: 59 | Admitting: Mental Health

## 2019-07-23 ENCOUNTER — Other Ambulatory Visit: Payer: Self-pay

## 2019-07-23 ENCOUNTER — Ambulatory Visit (INDEPENDENT_AMBULATORY_CARE_PROVIDER_SITE_OTHER): Payer: 59 | Admitting: Mental Health

## 2019-07-23 DIAGNOSIS — F419 Anxiety disorder, unspecified: Secondary | ICD-10-CM | POA: Diagnosis not present

## 2019-07-23 NOTE — Progress Notes (Signed)
[facility] Counselor/Therapist Progress Note   Patient ID: [name], MRN: [id number]  Date: 07/23/2019  Timespent: 52 minutes  Treatment Type: Individual therapy  Mental Status Exam:   Appearance:   Casual     Behavior:  Appropriate  Motor:  Normal  Speech/Language:   Normal Rate, rhythm   Affect:  Full Range  Mood:  anxious, depressed, pleasant   Thought process:  Coherent and Relevant  Thought content:    Logical, goal directed  Perceptual disturbances:    none  Orientation:  Full (Time, Place, and Person)  Attention:  Good  Concentration:  good  Memory:  intact  Fund of knowledge:   Good  Insight:    Good  Judgment:   Good  Impulse Control:  good    Reported Symptoms:  Depressed mood, anxiety most days, rumination, fatigue, anhedonia, some sleep disturbances  Risk Assessment: Danger to Self:  No Self-injurious Behavior: No Danger to Others: No Duty to Warn:no Physical Aggression / Violence:No  Access to Firearms a concern: No  Gang Involvement:No   Patient / guardian was educated about steps to take if suicide or homicide risk level increases between visits. While future psychiatric events cannot be accurately predicted, the patient does not currently require acute inpatient psychiatric care and does not currently meet [facility] involuntary commitment criteria.  Subjective: Patient arrived on time for today's session and noticeable distress.  She stated that she is having increased back pain today. She is to get a nerve block to possibly relieve pain. She was able to follow through w/ talking to her doctor as discussed last session.  She is to have the procedure next week and reported that her anxiety has decreased somewhat about the procedure.  We discussed thought blocking as a skill to employ to manage intrusive anxiety producing thoughts.  Assisted her in challenging some thoughts in today's session.  She went on to discuss the  situation involving a friend she talks to most days.  She stated that she is concerned due to some recent family related issues, but is unaware of exactly what her friend is going through.  She continues to cope with her ongoing medical concerns as well as financial stress.  She has taken some recent steps and confronting her mother as needed when she feels that she is being judged or ridiculed.  Encouraged patient to continue to utilize diaphragmatic breathing while integrating mindfulness concepts to decrease holding onto thoughts.  She plans to follow through between sessions.  Interventions: CBT, supportive therapy  Diagnosis:   ICD-10-CM   1. Anxiety  F41.9      Plan:  Patient is to apply thought blocking skill to decrease her anxiety about her upcoming medical appointment for her nerve block.  Patient to remind herself of calming, realistic self talk reminding herself that she is getting care from highly qualified doctors.  Patient to follow through on her diaphragmatic breathing exercises to keep stress and anxiety low.    Long-term goal:   Identify the major life complexities from the past and present that form the basis for present feelings of anxiety and depression. Short-term goal:  Enhance ability to handle effectively maintain self care while coping with pain associated with medical conditions. Patient will improve her        Positive, realistic and idealistic self talk to keep feelings of anxiety and depression more infrequent.   Assessment of progress:  progressing   Cristal Deer  Rosana Berger, [facility]

## 2019-07-29 ENCOUNTER — Other Ambulatory Visit: Payer: 59

## 2019-08-02 ENCOUNTER — Other Ambulatory Visit: Payer: Self-pay | Admitting: Psychiatry

## 2019-08-02 DIAGNOSIS — F419 Anxiety disorder, unspecified: Secondary | ICD-10-CM

## 2019-08-06 ENCOUNTER — Ambulatory Visit: Payer: 59 | Admitting: Mental Health

## 2019-08-08 ENCOUNTER — Ambulatory Visit: Payer: 59 | Admitting: Mental Health

## 2019-08-08 ENCOUNTER — Ambulatory Visit (INDEPENDENT_AMBULATORY_CARE_PROVIDER_SITE_OTHER): Payer: 59 | Admitting: Mental Health

## 2019-08-08 ENCOUNTER — Other Ambulatory Visit: Payer: Self-pay

## 2019-08-08 DIAGNOSIS — F331 Major depressive disorder, recurrent, moderate: Secondary | ICD-10-CM

## 2019-08-08 DIAGNOSIS — F419 Anxiety disorder, unspecified: Secondary | ICD-10-CM | POA: Diagnosis not present

## 2019-08-08 NOTE — Progress Notes (Signed)
         [facility] Counselor/Therapist Progress Note   Patient ID: [name], MRN: [id number]  Date: 08/08/2019  Timespent: 50 minutes  Treatment Type: Individual therapy  Mental Status Exam:   Appearance:   Casual     Behavior:  Appropriate  Motor:  Normal  Speech/Language:   Normal Rate, rhythm   Affect:  Full Range  Mood:  anxious, depressed, pleasant   Thought process:  Coherent and Relevant  Thought content:    Logical, goal directed  Perceptual disturbances:    none  Orientation:  Full (Time, Place, and Person)  Attention:  Good  Concentration:  good  Memory:  intact  Fund of knowledge:   Good  Insight:    Good  Judgment:   Good  Impulse Control:  good    Reported Symptoms:  Depressed mood, anxiety most days, rumination, fatigue, anhedonia, some sleep disturbances  Risk Assessment: Danger to Self:  No Self-injurious Behavior: No Danger to Others: No Duty to Warn:no Physical Aggression / Violence:No  Access to Firearms a concern: No  Gang Involvement:No   Patient / guardian was educated about steps to take if suicide or homicide risk level increases between visits. While future psychiatric events cannot be accurately predicted, the patient does not currently require acute inpatient psychiatric care and does not currently meet [facility] involuntary commitment criteria.  Subjective:  Patient arrived on time for today's session.  She stated she was able to receive her injection which was helpful with some pain levels.  She noticed the effects are temporary but is hopeful to have some relief over the next few days.  Continue to cope with financial stress and some relational stress specifically with her mother and aunt.  She shared how she attempted to set some boundaries in these relationships and feels it was helpful.  Through guided discovery, patient continues to identify being at odds with her future as she is uncertain with her career, financial  plans.  She would like to at some point trying to meet someone, a life partner however, due to the complexity of her life at this point this is delayed.  We reviewed coping, thought stopping, diaphragmatic breathing and we explore ways she will attempt to care for herself between sessions.  Interventions: CBT, supportive therapy  Diagnosis:   ICD-10-CM   1. Major depressive disorder, recurrent episode, moderate (HCC)  F33.1   2. Anxiety  F41.9      Plan:  Patient is to apply thought blocking skill to decrease her anxiety about her upcoming medical appointment for her nerve block.  Patient to remind herself of calming, realistic self talk reminding herself that she is getting care from highly qualified doctors.  Patient to follow through on her diaphragmatic breathing exercises to keep stress and anxiety low.    Long-term goal:   Identify the major life complexities from the past and present that form the basis for present feelings of anxiety and depression. Short-term goal:  Enhance ability to handle effectively maintain self care while coping with pain associated with medical conditions. Patient will improve her        Positive, realistic and idealistic self talk to keep feelings of anxiety and depression more infrequent.   Assessment of progress:  progressing   Anson [place], [facility]

## 2019-08-09 ENCOUNTER — Other Ambulatory Visit: Payer: Self-pay | Admitting: Psychiatry

## 2019-08-09 DIAGNOSIS — F419 Anxiety disorder, unspecified: Secondary | ICD-10-CM

## 2019-08-09 NOTE — Progress Notes (Signed)
Error

## 2019-08-12 DIAGNOSIS — Z981 Arthrodesis status: Secondary | ICD-10-CM | POA: Insufficient documentation

## 2019-08-14 ENCOUNTER — Other Ambulatory Visit: Payer: Self-pay

## 2019-08-15 ENCOUNTER — Other Ambulatory Visit (INDEPENDENT_AMBULATORY_CARE_PROVIDER_SITE_OTHER): Payer: No Typology Code available for payment source

## 2019-08-15 ENCOUNTER — Ambulatory Visit (INDEPENDENT_AMBULATORY_CARE_PROVIDER_SITE_OTHER): Payer: 59

## 2019-08-15 DIAGNOSIS — Z23 Encounter for immunization: Secondary | ICD-10-CM | POA: Diagnosis not present

## 2019-08-15 DIAGNOSIS — G8929 Other chronic pain: Secondary | ICD-10-CM

## 2019-08-15 DIAGNOSIS — M545 Low back pain: Secondary | ICD-10-CM

## 2019-08-15 LAB — BASIC METABOLIC PANEL
BUN: 12 mg/dL (ref 6–23)
CO2: 28 mEq/L (ref 19–32)
Calcium: 9.7 mg/dL (ref 8.4–10.5)
Chloride: 103 mEq/L (ref 96–112)
Creatinine, Ser: 1.05 mg/dL (ref 0.40–1.20)
GFR: 55.07 mL/min — ABNORMAL LOW (ref >60.00)
Glucose, Bld: 98 mg/dL (ref 70–99)
Potassium: 3.9 mEq/L (ref 3.5–5.1)
Sodium: 140 mEq/L (ref 135–145)

## 2019-08-20 ENCOUNTER — Other Ambulatory Visit: Payer: Self-pay

## 2019-08-20 ENCOUNTER — Ambulatory Visit (INDEPENDENT_AMBULATORY_CARE_PROVIDER_SITE_OTHER): Payer: 59 | Admitting: Mental Health

## 2019-08-20 DIAGNOSIS — F331 Major depressive disorder, recurrent, moderate: Secondary | ICD-10-CM

## 2019-08-20 NOTE — Progress Notes (Signed)
[facility] Counselor/Therapist Progress Note   Patient ID: [name], MRN: [id number]  Date: 08/20/2019  Timespent: 54 minutes  Treatment Type: Individual therapy  Mental Status Exam:   Appearance:   Casual     Behavior:  Appropriate  Motor:  Normal  Speech/Language:   Normal Rate, rhythm   Affect:  Full Range  Mood:  anxious, depressed, pleasant   Thought process:  Coherent and Relevant  Thought content:    Logical, goal directed  Perceptual disturbances:    none  Orientation:  Full (Time, Place, and Person)  Attention:  Good  Concentration:  good  Memory:  intact  Fund of knowledge:   Good  Insight:    Good  Judgment:   Good  Impulse Control:  good    Reported Symptoms:  Depressed mood, anxiety most days, rumination, fatigue, anhedonia, some sleep disturbances  Risk Assessment: Danger to Self:  No Self-injurious Behavior: No Danger to Others: No Duty to Warn:no Physical Aggression / Violence:No  Access to Firearms a concern: No  Gang Involvement:No   Patient / guardian was educated about steps to take if suicide or homicide risk level increases between visits. While future psychiatric events cannot be accurately predicted, the patient does not currently require acute inpatient psychiatric care and does not currently meet [facility] involuntary commitment criteria.  Subjective:  Patient arrived on time for today's session. Her dog has been ill, she is getting treatment and patient is hopeful she will have a full recovery soon.  Patient continues to cope with various stressors relating to health, financial, employment and family related.  Time spent to allow patient to detail recent progress, changes and ongoing stressors which remain in all these areas.  She stated she was able to express her feelings with some family members, namely with her mother and aunt as well as her brother.  She stated that she is getting more support emotionally from  her brother now who is calling more often and less judgment from her aunt and mother as they have more of a clear understanding of her stressors and their intensity.  She has been resourceful, trying to reach out to local resources to assist with financial needs, to keep up with monthly bills.  Was able to identify thoughts and feelings related to how she is at odds with her future, being able to possibly work at some point while also readying herself for the possibility that her physical ailments may be a persistent barrier for years to come.  Utilizing thought stopping at times between sessions we reviewed with patient to decrease rumination which has been ongoing as well.  She has had some success at times and plans to continue between sessions.  Other ways to cope and care for herself were explored, some reviewed.  Interventions: CBT, supportive therapy  Diagnosis:   ICD-10-CM   1. Major depressive disorder, recurrent episode, moderate (HCC)  F33.1      Plan:  Patient is to apply thought blocking skill to decrease her anxiety about her upcoming medical appointment for her nerve block.  Patient to remind herself of calming, realistic self talk reminding herself that she is getting care from highly qualified doctors.  Patient to follow through on her diaphragmatic breathing exercises to keep stress and anxiety low.    Long-term goal:   Identify the major life complexities from the past and present that form the basis for present feelings of anxiety and depression.  Short-term goal:  Enhance ability to handle effectively maintain self care while coping with pain associated with medical conditions. Patient will improve her        Positive, realistic and idealistic self talk to keep feelings of anxiety and depression more infrequent.   Assessment of progress:  progressing   Anson [place], [facility]

## 2019-08-21 ENCOUNTER — Telehealth (INDEPENDENT_AMBULATORY_CARE_PROVIDER_SITE_OTHER): Payer: 59 | Admitting: Family Medicine

## 2019-08-21 ENCOUNTER — Encounter: Payer: Self-pay | Admitting: Family Medicine

## 2019-08-21 DIAGNOSIS — J019 Acute sinusitis, unspecified: Secondary | ICD-10-CM

## 2019-08-21 MED ORDER — AMOXICILLIN-POT CLAVULANATE 875-125 MG PO TABS: 1.0000 | ORAL_TABLET | Freq: Two times a day (BID) | ORAL | 0 refills | Status: DC

## 2019-08-21 MED ORDER — METHYLPREDNISOLONE 4 MG PO TBPK: ORAL_TABLET | ORAL | 0 refills | Status: DC

## 2019-08-21 NOTE — Progress Notes (Signed)
Virtual Visit via Video Note  I connected with the patient on 08/21/19 at 11:00 AM EST by a video enabled telemedicine application and verified that I am speaking with the correct person using two identifiers.  Location patient: home Location provider:work or home office Persons participating in the virtual visit: patient, provider  I discussed the limitations of evaluation and management by telemedicine and the availability of in person appointments. The patient expressed understanding and agreed to proceed.   HPI: Here for 5 days of sinus pressure, headache, and PND. No fever or ST or cough or SOB. No body aches or NVD. Taking Mucinex and using Flonase.    ROS: See pertinent positives and negatives per HPI.  Past Medical History:  Diagnosis Date  . Anxiety   . Asthma    seasonal  . Headache   . Pelvic pain     Past Surgical History:  Procedure Laterality Date  . ABDOMINAL EXPOSURE N/A 01/30/2019   Procedure: ABDOMINAL EXPOSURE;  Surgeon: Nada Libman, MD;  Location: [facility] OR;  Service: Vascular;  Laterality: N/A;  . ANTERIOR LUMBAR FUSION N/A 01/30/2019   Procedure: Anterior Lumbar Interbody Fusion L5-S1;  Surgeon: Venita Lick, MD;  Location: [facility] OR;  Service: Orthopedics;  Laterality: N/A;   . CERVICAL BIOPSY  W/ LOOP ELECTRODE EXCISION     NOV 2014  . CHOLECYSTECTOMY    . LAPAROSCOPIC SALPINGO OOPHERECTOMY Bilateral 03/16/2017   Procedure: LAPAROSCOPIC SALPINGO OOPHORECTOMY;  Surgeon: Dara Lords, MD;  Location:  SURGERY CENTER;  Service: Gynecology;  Laterality: Bilateral;  . OPEN REDUCTION INTERNAL FIXATION (ORIF) DISTAL RADIAL FRACTURE Right 05/02/2018   Procedure: OPEN REDUCTION INTERNAL FIXATION (ORIF) DISTAL RADIAL FRACTURE;  Surgeon: Bradly Bienenstock, MD;  Location: [facility] OR;  Service: Orthopedics;  Laterality: Right;  . PELVIC LAPAROSCOPY      Family History  Problem Relation Age of Onset  . Hypertension Mother   . Anxiety disorder Mother   .  Depression Mother   . Breast cancer Other        GREAT AUNT     Current Outpatient Medications:  .  ALPRAZolam (XANAX) 0.5 MG tablet, Take 1 tablet (0.5 mg total) by mouth 2 (two) times daily as needed for anxiety., Disp: 60 tablet, Rfl: 2 .  ALPRAZolam (XANAX) 1 MG tablet, Take 1 mg by mouth at bedtime., Disp: , Rfl:  .  amoxicillin-clavulanate (AUGMENTIN) 875-125 MG tablet, Take 1 tablet by mouth 2 (two) times daily., Disp: 20 tablet, Rfl: 0 .  ASHWAGANDHA PO, Take by mouth., Disp: , Rfl:  .  buPROPion (WELLBUTRIN XL) 150 MG 24 hr tablet, Take 2 tablets (300 mg total) by mouth daily., Disp: 180 tablet, Rfl: 0 .  fluticasone (FLONASE) 50 MCG/ACT nasal spray, SPRAY 2 SPRAYS INTO EACH NOSTRIL EVERY DAY, Disp: 48 mL, Rfl: 2 .  Fluticasone Propionate, Inhal, (FLOVENT DISKUS) 100 MCG/BLIST AEPB, INHALE 100 MCG INTO THE LUNGS DAILY. (Patient taking differently: Take 100 mcg by mouth daily. ), Disp: 60 each, Rfl: 5 .  gabapentin (NEURONTIN) 400 MG capsule, Take 1 capsule po TID and 2 caps po QHS, Disp: 150 capsule, Rfl: 1 .  hydrOXYzine (ATARAX/VISTARIL) 10 MG tablet, TAKE 1 TABLET BY MOUTH THREE TIMES A DAY AS NEEDED, Disp: 30 tablet, Rfl: 0 .  methylPREDNISolone (MEDROL DOSEPAK) 4 MG TBPK tablet, As directed, Disp: 21 tablet, Rfl: 0 .  montelukast (SINGULAIR) 10 MG tablet, Take 1 tablet (10 mg total) by mouth at bedtime., Disp: 90 tablet, Rfl: 3 .  ondansetron (ZOFRAN) 4 MG tablet, Take 1 tablet (4 mg total) by mouth every 8 (eight) hours as needed for nausea or vomiting., Disp: 20 tablet, Rfl: 0 .  ondansetron (ZOFRAN) 4 MG tablet, Zofran 4 mg tablet  Take 2 tablets every 8 hours by oral route as needed for 5 days.  Take 1-2 tabs every 8 hours as needed for nausea, Disp: , Rfl:  .  PROAIR HFA 108 (90 Base) MCG/ACT inhaler, TAKE 2 PUFFS BY MOUTH EVERY 6 HOURS AS NEEDED FOR WHEEZE OR SHORTNESS OF BREATH (Patient taking differently: Inhale 2 puffs into the lungs every 6 (six) hours as needed for  shortness of breath. ), Disp: 8.5 Inhaler, Rfl: 5 .  rizatriptan (MAXALT-MLT) 10 MG disintegrating tablet, Take 1 tablet (10 mg total) by mouth as needed for migraine. May repeat in 2 hours if needed, Disp: 30 tablet, Rfl: 3 .  traMADol (ULTRAM) 50 MG tablet, Take 50 mg by mouth 3 (three) times daily as needed., Disp: , Rfl:  .  valACYclovir (VALTREX) 500 MG tablet, Take 1 tablet (500 mg total) by mouth 2 (two) times daily. (Patient taking differently: Take 500 mg by mouth 2 (two) times daily as needed (outbreak). ), Disp: 180 tablet, Rfl: 3  EXAM:  VITALS per patient if applicable:  GENERAL: alert, oriented, appears well and in no acute distress  HEENT: atraumatic, conjunttiva clear, no obvious abnormalities on inspection of external nose and ears  NECK: normal movements of the head and neck  LUNGS: on inspection no signs of respiratory distress, breathing rate appears normal, no obvious gross SOB, gasping or wheezing  CV: no obvious cyanosis  MS: moves all visible extremities without noticeable abnormality  PSYCH/NEURO: pleasant and cooperative, no obvious depression or anxiety, speech and thought processing grossly intact  ASSESSMENT AND PLAN: Sinusitis, treat with Augmentin and a Medrol dose pack.  Alysia Penna, MD  Discussed the following assessment and plan:  No diagnosis found.     I discussed the assessment and treatment plan with the patient. The patient was provided an opportunity to ask questions and all were answered. The patient agreed with the plan and demonstrated an understanding of the instructions.   The patient was advised to call back or seek an in-person evaluation if the symptoms worsen or if the condition fails to improve as anticipated.

## 2019-08-23 ENCOUNTER — Ambulatory Visit: Payer: Self-pay | Admitting: *Deleted

## 2019-08-23 ENCOUNTER — Encounter: Payer: Self-pay | Admitting: Family Medicine

## 2019-08-23 NOTE — Telephone Encounter (Signed)
Patient states she woke yesterday- started coughing, no fever, fatigue.Patient is treating with sudafed, inhaler, Tylenol, muscinex. Patient states she is normally gets sinus infection this time of year. Discussed treating symptoms, possible COVID testing and recommendations of COVID + as far as treating symptoms, reaching out to PCP and when to go to ED. Patient states she has everything she needs at home now- she is going to continue to treat symptoms and will reach out for changes. Patient thinks the cough triggers asthma attack with made her anxiety go up causing some panic. Patient to continue her home care and will send note for review by PCP. ( Did offer appointment with Saturday clinic PCP- patient declined for now.  Reason for Disposition . [1] Reasonable improvement on antibiotic AND [2] no fever or pain  Answer Assessment - Initial Assessment Questions 1. ANTIBIOTIC: "What antibiotic are you receiving?" "How many times per day?"     Augmentin- bid 2. ONSET: "When was the antibiotic started?"     Patient feels better than yesterday and this morning 3. PAIN: "How bad is the sinus pain?"   (Scale 1-10; mild, moderate or severe)   - MILD (1-3): doesn't interfere with normal activities    - MODERATE (4-7): interferes with normal activities (e.g., work or school) or awakens from sleep   - SEVERE (8-10): excruciating pain and patient unable to do any normal activities        Mild/moderate 4. FEVER: "Do you have a fever?" If so, ask: "What is it, how was it measured, and when did it start?"      No fever 5. SYMPTOMS: "Are there any other symptoms you're concerned about?" If so, ask: "When did it start?"     Cough, asthma attack, fatigue  Protocols used: SINUS INFECTION ON ANTIBIOTIC FOLLOW-UP CALL-A-AH

## 2019-08-23 NOTE — Telephone Encounter (Signed)
Pt stated she needs to speak with a nurse because her sinus infection is not getting any better and she is fatigued. Pt stated she is concerned about being fatigued and would like a call back. Cb# [phone]   Attempted to call patient- left message

## 2019-08-26 ENCOUNTER — Other Ambulatory Visit: Payer: Self-pay | Admitting: Family Medicine

## 2019-08-26 ENCOUNTER — Encounter: Payer: Self-pay | Admitting: Family Medicine

## 2019-08-26 NOTE — Telephone Encounter (Signed)
Patient has an appointment on 08/27/2019

## 2019-08-26 NOTE — Telephone Encounter (Signed)
FYI

## 2019-08-26 NOTE — Telephone Encounter (Signed)
Spoke to pt and she stated she has a lot of pain and pressure in sinuses. Pt is complaining of some SOB but think it may be from Asthma.  PT stated that her cough is better and she stated she has been taking Mucinex and her asthma medication. Pt denies fever. Pt last temp check was last night but denies temp. Pt stated her baseline is 97 and temp went to 98.5 but nothing more. Pt denies being tested for Covid-19 and flu. Pt declined appt. Will wait to see if sx worsen. Pt is scared about going off of prednisone.

## 2019-08-27 ENCOUNTER — Encounter: Payer: Self-pay | Admitting: Family Medicine

## 2019-08-27 ENCOUNTER — Encounter: Payer: 59 | Admitting: Family Medicine

## 2019-08-27 NOTE — Telephone Encounter (Signed)
FYI

## 2019-08-30 ENCOUNTER — Other Ambulatory Visit: Payer: Self-pay | Admitting: Psychiatry

## 2019-08-30 ENCOUNTER — Ambulatory Visit: Payer: 59 | Admitting: Psychiatry

## 2019-08-30 DIAGNOSIS — F419 Anxiety disorder, unspecified: Secondary | ICD-10-CM

## 2019-08-30 DIAGNOSIS — G4700 Insomnia, unspecified: Secondary | ICD-10-CM

## 2019-09-04 ENCOUNTER — Ambulatory Visit: Payer: 59

## 2019-09-10 ENCOUNTER — Other Ambulatory Visit: Payer: Self-pay

## 2019-09-10 ENCOUNTER — Ambulatory Visit (INDEPENDENT_AMBULATORY_CARE_PROVIDER_SITE_OTHER): Payer: Self-pay | Admitting: Mental Health

## 2019-09-10 ENCOUNTER — Encounter

## 2019-09-10 ENCOUNTER — Ambulatory Visit (INDEPENDENT_AMBULATORY_CARE_PROVIDER_SITE_OTHER): Payer: 59 | Admitting: Psychiatry

## 2019-09-10 ENCOUNTER — Encounter: Payer: Self-pay | Admitting: Psychiatry

## 2019-09-10 VITALS — BP 96/63 | HR 71 | Wt 120.0 lb

## 2019-09-10 DIAGNOSIS — F419 Anxiety disorder, unspecified: Secondary | ICD-10-CM

## 2019-09-10 DIAGNOSIS — F902 Attention-deficit hyperactivity disorder, combined type: Secondary | ICD-10-CM

## 2019-09-10 DIAGNOSIS — F331 Major depressive disorder, recurrent, moderate: Secondary | ICD-10-CM

## 2019-09-10 DIAGNOSIS — G4700 Insomnia, unspecified: Secondary | ICD-10-CM

## 2019-09-10 MED ORDER — HYDROXYZINE HCL 10 MG PO TABS: 10.0000 mg | ORAL_TABLET | Freq: Three times a day (TID) | ORAL | 0 refills | Status: DC | PRN

## 2019-09-10 MED ORDER — GABAPENTIN 400 MG PO CAPS: ORAL_CAPSULE | ORAL | 1 refills | Status: DC

## 2019-09-10 MED ORDER — BUPROPION HCL ER (XL) 150 MG PO TB24: 450.0000 mg | ORAL_TABLET | Freq: Every day | ORAL | 0 refills | Status: DC

## 2019-09-10 NOTE — Progress Notes (Signed)
         [facility] Counselor/Therapist Progress Note   Patient ID: [name], MRN: [id number]  Date: 09/10/2019  Timespent: 35 minutes  Treatment Type: Individual therapy  Mental Status Exam:   Appearance:   Casual     Behavior:  Appropriate  Motor:  Normal  Speech/Language:   Normal Rate, rhythm   Affect:  Full Range  Mood:  anxious, depressed, pleasant   Thought process:  Coherent and Relevant  Thought content:    Logical, goal directed  Perceptual disturbances:    none  Orientation:  Full (Time, Place, and Person)  Attention:  Good  Concentration:  good  Memory:  intact  Fund of knowledge:   Good  Insight:    Good  Judgment:   Good  Impulse Control:  good    Reported Symptoms:  Depressed mood, anxiety most days, rumination, fatigue, anhedonia, some sleep disturbances  Risk Assessment: Danger to Self:  No Self-injurious Behavior: No Danger to Others: No Duty to Warn:no Physical Aggression / Violence:No  Access to Firearms a concern: No  Gang Involvement:No   Patient / guardian was educated about steps to take if suicide or homicide risk level increases between visits. While future psychiatric events cannot be accurately predicted, the patient does not currently require acute inpatient psychiatric care and does not currently meet [facility] involuntary commitment criteria.  Subjective:  Patient shared progress.  She stated that her anxiety has been elevated, going on to share the continued stress related to her medical conditions, financial stress.  Patient shared how she is trying to seek out community resources to assist her financially so that she will not do his addiction at the end of the month.  She continues to navigate with her attorneys as she tries to plan for her future as medication continues.  She shared how she has tried to utilize her support system but that the stress and anxiety recently has been overwhelming, tearful at times  throughout the day and having interrupted sleeping at night most recently with a panic attack waking her around 2 AM.  Assisted her with refocusing on priorities on a daily and weekly basis versus looking to forehead as this can elevate the anxiety.  She plans to attend her coming medication management appointment to further share needs.     Interventions: CBT, supportive therapy  Diagnosis:   ICD-10-CM   1. Major depressive disorder, recurrent episode, moderate (HCC)  F33.1   2. Anxiety  F41.9      Plan:  Patient is to apply thought blocking skill to decrease her anxiety about her upcoming medical appointment for her nerve block.  Patient to remind herself of utilizing calming self talk to keep stress low.  Patient to utilize her support system.  Long-term goal:   Identify the major life complexities from the past and present that form the basis for present feelings of anxiety and depression. Short-term goal:  Enhance ability to handle effectively maintain self care while coping with pain associated with medical conditions. Patient will improve her        Positive, realistic and idealistic self talk to keep feelings of anxiety and depression more infrequent.   Assessment of progress:  progressing   Anson [place], [facility]

## 2019-09-10 NOTE — Progress Notes (Signed)
[name] [id number] Aug 26, 1966 53 y.o.  Subjective:   Patient ID:  [name] is a 53 y.o. (DOB 1966/01/28) female.  Chief Complaint:  Chief Complaint  Patient presents with  . Anxiety  . Depression  . Insomnia    HPI [name] presents to the office today for follow-up of anxiety, depression, and insomnia. She reports that she is having multiple acute stressors which has resulted in increased anxiety and some panic s/s. Reports that stressors have triggered crying episodes and situational sadness. She reports that her s/s "are normal based on everything that I have going on." She reports limited improvement with Xanax. She reports that Gabapentin has been helpful for her anxiety. Sleep is improved with Gabapentin and Xanax. Reports awakening last with increased anxiety. Appetite was decreased for a period of time. She reports that appetite is now consistent with her baseline. Energy and motivation have been fair. Denies SI.   She questions if she may have ADD due to difficulty with concentration and focus. She reports that she has difficulty when there are multiple tasks to complete. She reports that these s/s are long-standing and recalls having difficulty in school and her grades did not reflect knowledge of subject matter. She reports that she has had to work to [employer] for concentration difficulties. Reports that she is easily distracted and feels overwhelmed with not knowing where to start and how to prioritize. Frequently loses and misplaces things. Difficulty keeping up with appointments. Also has difficulty not interrupting others. Reports starting tasks and not completing them. She reports some restlessness and fidgeting. Recalls difficulty sitting still in school.   Reports taking Xanax every night and taking prn during the day.   Past Psychiatric Medication Trials: Gabapentin- prescribed for nerve pain. Needs 600 mg to help with insomnia.  Has been helpful for anxiety and pain.  Cymbalta- Gained 20 lbs in 3 months. Had severe discontinuation s/s.  Zoloft- Felt groggy and "spacey." Sexual side effects.  Prozac- felt groggy and spacey. "All I wanted to do was cry." Wellbutrin XL- Has been helpful for depression and has taken it periodically. Took 150 mg po qd. Last took around 2018.  Wellbutrin SR- Felt that 100 mg was ineffective. Alprazolam- Taken prn for 6 years. Diazepam Trazodone- jitteriness. Paradoxical effect. Hydroxyzine- effective for anxiety   PHQ2-9     Telemedicine from 07/04/2019 in [facility] at [employer] from 06/13/2017 in [facility] at [facility] Total Score  3  0       Review of Systems:  Review of Systems  Musculoskeletal: Positive for back pain. Negative for gait problem.  Neurological: Negative for tremors.  Psychiatric/Behavioral:       Please refer to HPI    Medications: I have reviewed the patient's current medications.  Current Outpatient Medications  Medication Sig Dispense Refill  . ALPRAZolam (XANAX) 1 MG tablet Take 1 mg by mouth at bedtime.    . ASHWAGANDHA PO Take by mouth.    Marland Kitchen buPROPion (WELLBUTRIN XL) 150 MG 24 hr tablet Take 3 tablets (450 mg total) by mouth daily. 270 tablet 0  . fluticasone (FLONASE) 50 MCG/ACT nasal spray SPRAY 2 SPRAYS INTO EACH NOSTRIL EVERY DAY 48 mL 2  . Fluticasone Propionate, Inhal, (FLOVENT DISKUS) 100 MCG/BLIST AEPB INHALE 100 MCG INTO THE LUNGS DAILY. (Patient taking differently: Take 100 mcg by mouth daily. ) 60 each 5  . gabapentin (NEURONTIN) 400 MG capsule TAKE 1 CAPSULE BY MOUTH 3 TIMES  A DAY AND 2 CAPSULES AT BEDTIME 150 capsule 1  . hydrOXYzine (ATARAX/VISTARIL) 10 MG tablet Take 1 tablet (10 mg total) by mouth 3 (three) times daily as needed. 30 tablet 0  . ibuprofen (ADVIL) 800 MG tablet Take 800 mg by mouth every 8 (eight) hours as needed.    . meloxicam (MOBIC) 15 MG tablet Mobic 15 mg tablet   1 tablet  every day by oral route.    . montelukast (SINGULAIR) 10 MG tablet Take 1 tablet (10 mg total) by mouth at bedtime. 90 tablet 3  . ondansetron (ZOFRAN) 4 MG tablet Take 1 tablet (4 mg total) by mouth every 8 (eight) hours as needed for nausea or vomiting. 20 tablet 0  . ondansetron (ZOFRAN) 4 MG tablet Zofran 4 mg tablet  Take 2 tablets every 8 hours by oral route as needed for 5 days.  Take 1-2 tabs every 8 hours as needed for nausea    . PROAIR HFA 108 (90 Base) MCG/ACT inhaler TAKE 2 PUFFS BY MOUTH EVERY 6 HOURS AS NEEDED FOR WHEEZE OR SHORTNESS OF BREATH (Patient taking differently: Inhale 2 puffs into the lungs every 6 (six) hours as needed for shortness of breath. ) 8.5 Inhaler 5  . rizatriptan (MAXALT-MLT) 10 MG disintegrating tablet Take 1 tablet (10 mg total) by mouth as needed for migraine. May repeat in 2 hours if needed 30 tablet 3  . traMADol (ULTRAM) 50 MG tablet Take 50 mg by mouth 3 (three) times daily as needed.    . ALPRAZolam (XANAX) 0.5 MG tablet Take 1 tablet (0.5 mg total) by mouth 2 (two) times daily as needed for anxiety. 60 tablet 2  . amoxicillin-clavulanate (AUGMENTIN) 875-125 MG tablet Take 1 tablet by mouth 2 (two) times daily. (Patient not taking: Reported on 09/10/2019) 20 tablet 0  . methylPREDNISolone (MEDROL DOSEPAK) 4 MG TBPK tablet As directed (Patient not taking: Reported on 09/10/2019) 21 tablet 0  . valACYclovir (VALTREX) 500 MG tablet Take 1 tablet (500 mg total) by mouth 2 (two) times daily. (Patient not taking: Reported on 09/10/2019) 180 tablet 3   No current facility-administered medications for this visit.    Medication Side Effects: None  Allergies: No Known Allergies  Past Medical History:  Diagnosis Date  . Anxiety   . Asthma    seasonal  . Headache   . Pelvic pain     Family History  Problem Relation Age of Onset  . Hypertension Mother   . Anxiety disorder Mother   . Depression Mother   . Breast cancer Other        GREAT AUNT  .  ADD / ADHD Father     Social History   Socioeconomic History  . Marital status: Divorced    Spouse name: Not on file  . Number of children: Not on file  . Years of education: Not on file  . Highest education level: Not on file  Occupational History  . Not on file  Tobacco Use  . Smoking status: Never Smoker  . Smokeless tobacco: Never Used  Substance and Sexual Activity  . Alcohol use: Not Currently    Alcohol/week: 1.0 standard drinks    Types: 1 Glasses of wine per week  . Drug use: No  . Sexual activity: Not Currently  Other Topics Concern  . Not on file  Social History Narrative  . Not on file   Social Determinants of Health   Financial Resource Strain:   . Difficulty of  Paying Living Expenses: Not on file  Food Insecurity:   . Worried About Charity fundraiser in the Last Year: Not on file  . Ran Out of Food in the Last Year: Not on file  Transportation Needs:   . Lack of Transportation (Medical): Not on file  . Lack of Transportation (Non-Medical): Not on file  Physical Activity:   . Days of Exercise per Week: Not on file  . Minutes of Exercise per Session: Not on file  Stress:   . Feeling of Stress : Not on file  Social Connections:   . Frequency of Communication with Friends and Family: Not on file  . Frequency of Social Gatherings with Friends and Family: Not on file  . Attends Religious Services: Not on file  . Active Member of Clubs or Organizations: Not on file  . Attends Archivist Meetings: Not on file  . Marital Status: Not on file  Intimate Partner Violence:   . Fear of Current or Ex-Partner: Not on file  . Emotionally Abused: Not on file  . Physically Abused: Not on file  . Sexually Abused: Not on file    Past Medical History, Surgical history, Social history, and Family history were reviewed and updated as appropriate.   Please see review of systems for further details on the patient's review from today.   Objective:   Physical  Exam:  BP 96/63   Pulse 71   Wt 120 lb (54.4 kg)   LMP 02/19/2009 (LMP Unknown)   BMI 18.79 kg/m   Physical Exam Constitutional:      General: She is not in acute distress.    Appearance: She is well-developed.  Musculoskeletal:        General: No deformity.  Neurological:     Mental Status: She is alert and oriented to person, place, and time.     Coordination: Coordination normal.  Psychiatric:        Attention and Perception: Attention and perception normal. She does not perceive auditory or visual hallucinations.        Mood and Affect: Mood is anxious. Mood is not depressed. Affect is not labile, blunt, angry or inappropriate.        Speech: Speech normal.        Behavior: Behavior normal.        Thought Content: Thought content normal. Thought content is not paranoid or delusional. Thought content does not include homicidal or suicidal ideation. Thought content does not include homicidal or suicidal plan.        Cognition and Memory: Cognition and memory normal.        Judgment: Judgment normal.     Comments: Insight intact     Lab Review:     Component Value Date/Time   NA 140 08/15/2019 1007   K 3.9 08/15/2019 1007   CL 103 08/15/2019 1007   CO2 28 08/15/2019 1007   GLUCOSE 98 08/15/2019 1007   BUN 12 08/15/2019 1007   CREATININE 1.05 08/15/2019 1007   CREATININE 0.83 03/13/2017 1211   CALCIUM 9.7 08/15/2019 1007   PROT 7.1 03/15/2018 0846   ALBUMIN 4.3 03/15/2018 0846   AST 13 03/15/2018 0846   ALT 9 03/15/2018 0846   ALKPHOS 97 03/15/2018 0846   BILITOT 0.3 03/15/2018 0846   GFRNONAA >60 01/30/2019 1424   GFRAA >60 01/30/2019 1424       Component Value Date/Time   WBC 12.6 (H) 01/30/2019 1424   RBC 4.06 01/30/2019 1424  HGB 12.2 01/30/2019 1424   HCT 36.8 01/30/2019 1424   PLT 229 01/30/2019 1424   MCV 90.6 01/30/2019 1424   MCH 30.0 01/30/2019 1424   MCHC 33.2 01/30/2019 1424   RDW 13.2 01/30/2019 1424   LYMPHSABS 1.3 03/15/2018 0846    MONOABS 0.5 03/15/2018 0846   EOSABS 0.1 03/15/2018 0846   BASOSABS 0.0 03/15/2018 0846    No results found for: POCLITH, LITHIUM   No results found for: PHENYTOIN, PHENOBARB, VALPROATE, CBMZ   .res Assessment: Plan:   Patient seen for 30 minutes and greater than 50% of visit spent counseling patient regarding signs and symptoms of attention deficit disorder and discussing that some of her reported signs and symptoms are consistent with attention deficit disorder.  Discussed different treatment options for ADD and potential benefits, risks, and side effects.  Patient reports that she would prefer to start with increase in Wellbutrin XL prior to starting a stimulant since she has been tolerating Wellbutrin XL without any significant difficulty and increasing Wellbutrin may also improve depressive signs and symptoms. Will continue gabapentin since patient reports that this has been effective for anxiety and insomnia. Will continue hydroxyzine as needed for anxiety. Patient asks if this provider could assume management of alprazolam.  Discussed that she contact her PCP, who is currently managing alprazolam, to ask if he would like to continue managing alprazolam or transfer management of alprazolam to this provider. Recommend continuing psychotherapy with Elio Forgethris Andrews, LPC. Patient to follow-up with this provider in 3 months or sooner if clinically indicated. Patient advised to contact office with any questions, adverse effects, or acute worsening in signs and symptoms.  [name] was seen today for anxiety, depression and insomnia.  Diagnoses and all orders for this visit:  Moderate recurrent major depression (HCC) -     buPROPion (WELLBUTRIN XL) 150 MG 24 hr tablet; Take 3 tablets (450 mg total) by mouth daily.  Anxiety -     hydrOXYzine (ATARAX/VISTARIL) 10 MG tablet; Take 1 tablet (10 mg total) by mouth 3 (three) times daily as needed. -     gabapentin (NEURONTIN) 400 MG capsule; TAKE 1  CAPSULE BY MOUTH 3 TIMES A DAY AND 2 CAPSULES AT BEDTIME  Insomnia, unspecified type -     gabapentin (NEURONTIN) 400 MG capsule; TAKE 1 CAPSULE BY MOUTH 3 TIMES A DAY AND 2 CAPSULES AT BEDTIME  Attention deficit hyperactivity disorder (ADHD), combined type     Please see After Visit Summary for patient specific instructions.  Future Appointments  Date Time Provider Department Center  09/24/2019 10:00 AM [place] None  10/03/2019 11:00 AM [place] SessionAndrews, Christopher, [facility] None  10/17/2019 10:00 AM [place] SessionAndrews, Christopher, [facility]    No orders of the defined types were placed in this encounter.   -------------------------------

## 2019-09-24 ENCOUNTER — Ambulatory Visit (INDEPENDENT_AMBULATORY_CARE_PROVIDER_SITE_OTHER): Payer: Self-pay | Admitting: Mental Health

## 2019-09-24 ENCOUNTER — Other Ambulatory Visit: Payer: Self-pay

## 2019-09-24 DIAGNOSIS — F331 Major depressive disorder, recurrent, moderate: Secondary | ICD-10-CM

## 2019-09-24 DIAGNOSIS — F419 Anxiety disorder, unspecified: Secondary | ICD-10-CM

## 2019-09-24 DIAGNOSIS — F902 Attention-deficit hyperactivity disorder, combined type: Secondary | ICD-10-CM

## 2019-09-24 NOTE — Progress Notes (Signed)
[facility] Counselor/Therapist Progress Note   Patient ID: ARELENE MORONI, MRN: [id number]  Date: 09/24/2019  Timespent: 51 minutes  Treatment Type: Individual therapy  Mental Status Exam:   Appearance:   Casual     Behavior:  Appropriate  Motor:  Normal  Speech/Language:   Normal Rate, rhythm   Affect:  Full Range  Mood:  anxious, depressed, pleasant   Thought process:  Coherent and Relevant  Thought content:    Logical, goal directed  Perceptual disturbances:    none  Orientation:  Full (Time, Place, and Person)  Attention:  Good  Concentration:  good  Memory:  intact  Fund of knowledge:   Good  Insight:    Good  Judgment:   Good  Impulse Control:  good    Reported Symptoms:  Depressed mood, anxiety most days, rumination, fatigue, anhedonia, some sleep disturbances  Risk Assessment: Danger to Self:  No Self-injurious Behavior: No Danger to Others: No Duty to Warn:no Physical Aggression / Violence:No  Access to Firearms a concern: No  Gang Involvement:No   Patient / guardian was educated about steps to take if suicide or homicide risk level increases between visits. While future psychiatric events cannot be accurately predicted, the patient does not currently require acute inpatient psychiatric care and does not currently meet [facility] involuntary commitment criteria.  Subjective:  Patient arrived on time for today's session.  Continues to deal with several social stressors, financial, health/medical and family related.  She stated that her mind has been highly distracted recently giving various examples, difficulty focusing concentrating feeling "scattered".  This is been ongoing for years to a degree but increased in severity over the past few months due to the level of stressors in which she is currently coping.  She has been able to set some boundaries with family members reports these relationships have improved and pleasant interactions via  phone over the Christmas holiday was shared.  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  Continue to cope with chronic pain patient continues to utilize strategies given by doctors to try to ease her daily pain levels including a recent injection which has been minimally effective up to this point.  Patient remains hopeful, shares how she tries to remain optimistic and follows through with making attempts to keep this mindset as her various stressors persist.  Provide support, continuing to work with patient from a strength-based cognitive behavioral framework.  Encouraged patient to continue mindfulness toward calming self talk to keep anxiety and stress as low as possible during this time while also following through with all medical appointments and other areas toward self-care.   Patient shared progress.  She stated that her anxiety has been elevated, going on to share the continued stress related to her medical conditions, financial stress.  Patient shared how she is trying to seek out community resources to assist her financially so that she will not do his addiction at the end of the month.  She continues to navigate with her attorneys as she tries to plan for her future as medication continues.  She shared how she has tried to utilize her support system but that the stress and anxiety recently has been overwhelming, tearful at times throughout the day and having interrupted sleeping at night most recently with a panic attack waking her around 2 AM.  Assisted her with  refocusing on priorities on a daily and weekly basis versus looking to forehead as this can elevate the anxiety.  She plans to attend her coming medication management appointment to further share needs.     Interventions: CBT, supportive therapy  Diagnosis:   ICD-10-CM   1. Major depressive disorder, recurrent episode,  moderate (HCC)  F33.1   2. Anxiety  F41.9   3. Attention deficit hyperactivity disorder (ADHD), combined type  F90.2      Plan:  Patient is to apply thought blocking skill to decrease her anxiety about her upcoming medical appointment for her nerve block.  Patient to remind herself of utilizing calming self talk to keep stress low.  Patient to utilize her support system.  Long-term goal:   Identify the major life complexities from the past and present that form the basis for present feelings of anxiety and depression. Short-term goal:  Enhance ability to handle effectively maintain self care while coping with pain associated with medical conditions. Patient will improve her        Positive, realistic and idealistic self talk to keep feelings of anxiety and depression more infrequent.   Assessment of progress:  progressing   Anson [place], [facility]

## 2019-09-25 ENCOUNTER — Encounter

## 2019-09-25 ENCOUNTER — Ambulatory Visit (INDEPENDENT_AMBULATORY_CARE_PROVIDER_SITE_OTHER): Payer: Self-pay | Admitting: Psychiatry

## 2019-09-25 ENCOUNTER — Encounter: Payer: Self-pay | Admitting: Psychiatry

## 2019-09-25 ENCOUNTER — Ambulatory Visit: Payer: 59 | Admitting: Family Medicine

## 2019-09-25 DIAGNOSIS — F902 Attention-deficit hyperactivity disorder, combined type: Secondary | ICD-10-CM

## 2019-09-25 DIAGNOSIS — F419 Anxiety disorder, unspecified: Secondary | ICD-10-CM

## 2019-09-25 DIAGNOSIS — F331 Major depressive disorder, recurrent, moderate: Secondary | ICD-10-CM

## 2019-09-25 MED ORDER — METHYLPHENIDATE HCL ER 18 MG PO TB24: 18.0000 mg | ORAL_TABLET | Freq: Every day | ORAL | 0 refills | Status: DC

## 2019-09-25 NOTE — Progress Notes (Signed)
[name] [id number] January 14, 1966 53 y.o.  Virtual Visit via Telephone Note  I connected with pt on 09/25/19 at  1:00 PM EST by telephone and verified that I am speaking with the correct person using two identifiers.  I discussed the limitations, risks, security and privacy concerns of performing an evaluation and management service by telephone and the availability of in person appointments. I also discussed with the patient that there may be a patient responsible charge related to this service. The patient expressed understanding and agreed to proceed.   I discussed the assessment and treatment plan with the patient. The patient was provided an opportunity to ask questions and all were answered. The patient agreed with the plan and demonstrated an understanding of the instructions.   The patient was advised to call back or seek an in-person evaluation if the symptoms worsen or if the condition fails to improve as anticipated.  I provided 25 minutes of non-face-to-face time during this encounter.  The patient was located at home.  The provider was located at [facility].   Corie Chiquito, PMHNP   Subjective:   Patient ID:  [name] is a 53 y.o. (DOB 1966/02/05) female.  Chief Complaint:  Chief Complaint  Patient presents with  . ADD  . Follow-up    Anxiety, Depression    HPI [name] presents for follow-up of depression, anxiety, and insomnia. She reports that she had a recent spinal injection around the time Wellbutrin XL was increased and then experienced significant insomnia and increased anxiety. She reports that she decreased Wellbutrin XL to 300 mg and then to 150 mg po qd and "seem to be doing better overall with depression." She reports that her sleep has been gradually improving. She reports that she is coping with several stressors. She reports that her anxiety has improved. She reports that she has been having concentration  difficulties. Has been using reminders to ensure she takes medication on time. Reports that she has started multiple projects and not completed them. Denies SI.   Past Psychiatric Medication Trials: Gabapentin- prescribed for nerve pain. Needs 600 mg to help with insomnia. Has been helpful for anxiety and pain.  Cymbalta- Gained 20 lbs in 3 months. Had severe discontinuation s/s.  Zoloft- Felt groggy and "spacey." Sexual side effects.  Prozac- felt groggy and spacey. "All I wanted to do was cry." Wellbutrin XL- Has been helpful for depression and has taken it periodically. Took 150 mg po qd. Last took around 2018.  Wellbutrin SR- Felt that 100 mg was ineffective. Alprazolam- Taken prn for 6 years. Diazepam Trazodone- jitteriness. Paradoxical effect. Hydroxyzine- effective for anxiety  Review of Systems:  Review of Systems  Medications: I have reviewed the patient's current medications.  Current Outpatient Medications  Medication Sig Dispense Refill  . ALPRAZolam (XANAX) 1 MG tablet Take 1 mg by mouth at bedtime.    . ASHWAGANDHA PO Take by mouth.    Marland Kitchen buPROPion (WELLBUTRIN XL) 150 MG 24 hr tablet Take 3 tablets (450 mg total) by mouth daily. (Patient taking differently: Take 150 mg by mouth daily. ) 270 tablet 0  . fluticasone (FLONASE) 50 MCG/ACT nasal spray SPRAY 2 SPRAYS INTO EACH NOSTRIL EVERY DAY 48 mL 2  . gabapentin (NEURONTIN) 400 MG capsule TAKE 1 CAPSULE BY MOUTH 3 TIMES A DAY AND 2 CAPSULES AT BEDTIME 150 capsule 1  . hydrOXYzine (ATARAX/VISTARIL) 10 MG tablet Take 1 tablet (10 mg total) by mouth 3 (three) times daily  as needed. 30 tablet 0  . ibuprofen (ADVIL) 800 MG tablet Take 800 mg by mouth every 8 (eight) hours as needed.    . meloxicam (MOBIC) 15 MG tablet Mobic 15 mg tablet   1 tablet every day by oral route.    . montelukast (SINGULAIR) 10 MG tablet Take 1 tablet (10 mg total) by mouth at bedtime. 90 tablet 3  . ondansetron (ZOFRAN) 4 MG tablet Take 1 tablet (4 mg  total) by mouth every 8 (eight) hours as needed for nausea or vomiting. 20 tablet 0  . ondansetron (ZOFRAN) 4 MG tablet Zofran 4 mg tablet  Take 2 tablets every 8 hours by oral route as needed for 5 days.  Take 1-2 tabs every 8 hours as needed for nausea    . PROAIR HFA 108 (90 Base) MCG/ACT inhaler TAKE 2 PUFFS BY MOUTH EVERY 6 HOURS AS NEEDED FOR WHEEZE OR SHORTNESS OF BREATH (Patient taking differently: Inhale 2 puffs into the lungs every 6 (six) hours as needed for shortness of breath. ) 8.5 Inhaler 5  . rizatriptan (MAXALT-MLT) 10 MG disintegrating tablet Take 1 tablet (10 mg total) by mouth as needed for migraine. May repeat in 2 hours if needed 30 tablet 3  . traMADol (ULTRAM) 50 MG tablet Take 50 mg by mouth 3 (three) times daily as needed.    . ALPRAZolam (XANAX) 0.5 MG tablet Take 1 tablet (0.5 mg total) by mouth 2 (two) times daily as needed for anxiety. 60 tablet 2  . amoxicillin-clavulanate (AUGMENTIN) 875-125 MG tablet Take 1 tablet by mouth 2 (two) times daily. (Patient not taking: Reported on 09/10/2019) 20 tablet 0  . Fluticasone Propionate, Inhal, (FLOVENT DISKUS) 100 MCG/BLIST AEPB INHALE 100 MCG INTO THE LUNGS DAILY. (Patient taking differently: Take 100 mcg by mouth daily. ) 60 each 5  . methylphenidate 18 MG PO CR tablet Take 1 tablet (18 mg total) by mouth daily. 30 tablet 0  . methylPREDNISolone (MEDROL DOSEPAK) 4 MG TBPK tablet As directed (Patient not taking: Reported on 09/10/2019) 21 tablet 0  . valACYclovir (VALTREX) 500 MG tablet Take 1 tablet (500 mg total) by mouth 2 (two) times daily. (Patient not taking: Reported on 09/10/2019) 180 tablet 3   No current facility-administered medications for this visit.    Medication Side Effects: Other: Increased anxiety and insomnia with higher dose of Wellbutrin XL.   Allergies: No Known Allergies  Past Medical History:  Diagnosis Date  . Anxiety   . Asthma    seasonal  . Headache   . Pelvic pain     Family History   Problem Relation Age of Onset  . Hypertension Mother   . Anxiety disorder Mother   . Depression Mother   . Breast cancer Other        GREAT AUNT  . ADD / ADHD Father     Social History   Socioeconomic History  . Marital status: Divorced    Spouse name: Not on file  . Number of children: Not on file  . Years of education: Not on file  . Highest education level: Not on file  Occupational History  . Not on file  Tobacco Use  . Smoking status: Never Smoker  . Smokeless tobacco: Never Used  Substance and Sexual Activity  . Alcohol use: Not Currently    Alcohol/week: 1.0 standard drinks    Types: 1 Glasses of wine per week  . Drug use: No  . Sexual activity: Not Currently  Other  Topics Concern  . Not on file  Social History Narrative  . Not on file   Social Determinants of Health   Financial Resource Strain:   . Difficulty of Paying Living Expenses: Not on file  Food Insecurity:   . Worried About Programme researcher, broadcasting/film/videounning Out of Food in the Last Year: Not on file  . Ran Out of Food in the Last Year: Not on file  Transportation Needs:   . Lack of Transportation (Medical): Not on file  . Lack of Transportation (Non-Medical): Not on file  Physical Activity:   . Days of Exercise per Week: Not on file  . Minutes of Exercise per Session: Not on file  Stress:   . Feeling of Stress : Not on file  Social Connections:   . Frequency of Communication with Friends and Family: Not on file  . Frequency of Social Gatherings with Friends and Family: Not on file  . Attends Religious Services: Not on file  . Active Member of Clubs or Organizations: Not on file  . Attends BankerClub or Organization Meetings: Not on file  . Marital Status: Not on file  Intimate Partner Violence:   . Fear of Current or Ex-Partner: Not on file  . Emotionally Abused: Not on file  . Physically Abused: Not on file  . Sexually Abused: Not on file    Past Medical History, Surgical history, Social history, and Family history  were reviewed and updated as appropriate.   Please see review of systems for further details on the patient's review from today.   Objective:   Physical Exam:  LMP 02/19/2009 (LMP Unknown)   Physical Exam Neurological:     Mental Status: She is alert and oriented to person, place, and time.     Cranial Nerves: No dysarthria.  Psychiatric:        Attention and Perception: Perception normal. She is inattentive.        Mood and Affect: Mood is anxious.        Speech: Speech normal.        Behavior: Behavior is cooperative.        Thought Content: Thought content normal. Thought content is not paranoid or delusional. Thought content does not include homicidal or suicidal ideation. Thought content does not include homicidal or suicidal plan.        Cognition and Memory: Cognition and memory normal.        Judgment: Judgment normal.     Comments: Insight intact     Lab Review:     Component Value Date/Time   NA 140 08/15/2019 1007   K 3.9 08/15/2019 1007   CL 103 08/15/2019 1007   CO2 28 08/15/2019 1007   GLUCOSE 98 08/15/2019 1007   BUN 12 08/15/2019 1007   CREATININE 1.05 08/15/2019 1007   CREATININE 0.83 03/13/2017 1211   CALCIUM 9.7 08/15/2019 1007   PROT 7.1 03/15/2018 0846   ALBUMIN 4.3 03/15/2018 0846   AST 13 03/15/2018 0846   ALT 9 03/15/2018 0846   ALKPHOS 97 03/15/2018 0846   BILITOT 0.3 03/15/2018 0846   GFRNONAA >60 01/30/2019 1424   GFRAA >60 01/30/2019 1424       Component Value Date/Time   WBC 12.6 (H) 01/30/2019 1424   RBC 4.06 01/30/2019 1424   HGB 12.2 01/30/2019 1424   HCT 36.8 01/30/2019 1424   PLT 229 01/30/2019 1424   MCV 90.6 01/30/2019 1424   MCH 30.0 01/30/2019 1424   MCHC 33.2 01/30/2019 1424  RDW 13.2 01/30/2019 1424   LYMPHSABS 1.3 03/15/2018 0846   MONOABS 0.5 03/15/2018 0846   EOSABS 0.1 03/15/2018 0846   BASOSABS 0.0 03/15/2018 0846    No results found for: POCLITH, LITHIUM   No results found for: PHENYTOIN, PHENOBARB,  VALPROATE, CBMZ   .res Assessment: Plan:   Pt seen for 25 minutes and greater than 50% of session spent counseling pt re: tx options for ADD. Discussed potential benefits, risks, and side effects of stimulants with patient to include increased heart rate, palpitations, insomnia, increased anxiety, increased irritability, or decreased appetite.  Instructed patient to contact office if experiencing any significant tolerability issues.Discussed potential benefits, risks, and side effects of Concerta and pt agrees to trial of Concerta. Will start Concerta 18 mg po q am for ADD. Will start with low dose considering pt's h/o med sensitivity and discussed that dose may need to be increased further based upon response and tolerability.  Encouraged pt to contact office re: response in 1-2 weeks, particularly if she is experiencing no tolerability issues and only a partial response.  Recommend continuing all other medications as prescribed for depression and anxiety since pt reports some overally improvement in anxiety and mood s/s considering current stressors.  Recommend continuing psychotherapy with Lanetta Inch, LPC. Patient advised to contact office with any questions, adverse effects, or acute worsening in signs and symptoms. Pt to f/u in 4 weeks or sooner if clinically indicated.  Leoda was seen today for add and follow-up.  Diagnoses and all orders for this visit:  Attention deficit hyperactivity disorder (ADHD), combined type -     methylphenidate 18 MG PO CR tablet; Take 1 tablet (18 mg total) by mouth daily.  Moderate recurrent major depression ([place])  Anxiety    Please see After Visit Summary for patient specific instructions.  Future Appointments  Date Time Provider [place]  10/03/2019 11:00 AM [place], [facility] CP-CP None  10/17/2019 10:00 AM [place], [facility] CP-CP None    No orders of the defined types were placed in this encounter.      -------------------------------

## 2019-10-03 ENCOUNTER — Ambulatory Visit: Payer: Self-pay | Admitting: Mental Health

## 2019-10-03 ENCOUNTER — Other Ambulatory Visit: Payer: Self-pay | Admitting: Psychiatry

## 2019-10-03 DIAGNOSIS — F419 Anxiety disorder, unspecified: Secondary | ICD-10-CM

## 2019-10-04 ENCOUNTER — Encounter: Payer: Self-pay | Admitting: Family Medicine

## 2019-10-06 NOTE — Telephone Encounter (Signed)
Have Corie Chiquito prescribe this since she is her psychiatric provider

## 2019-10-07 ENCOUNTER — Telehealth: Payer: Self-pay | Admitting: Psychiatry

## 2019-10-07 DIAGNOSIS — F419 Anxiety disorder, unspecified: Secondary | ICD-10-CM

## 2019-10-07 NOTE — Telephone Encounter (Signed)
Patient left a voice mail stating that she would like a med change. Does pt need to have an appointment or can medicine be changed. Please call her at [phone]

## 2019-10-08 MED ORDER — ALPRAZOLAM 1 MG PO TABS: 1.0000 mg | ORAL_TABLET | Freq: Two times a day (BID) | ORAL | 2 refills | Status: DC | PRN

## 2019-10-08 NOTE — Addendum Note (Signed)
Addended by: Derenda Mis on: 10/08/2019 02:43 PM   Modules accepted: Orders

## 2019-10-10 ENCOUNTER — Other Ambulatory Visit: Payer: Self-pay | Admitting: Family Medicine

## 2019-10-10 ENCOUNTER — Encounter: Payer: Self-pay | Admitting: Family Medicine

## 2019-10-11 NOTE — Telephone Encounter (Signed)
I am not sure how quickly she could get one. I suggest she get on [email or web address] to get more information

## 2019-10-16 ENCOUNTER — Ambulatory Visit: Payer: Self-pay | Admitting: Psychiatry

## 2019-10-17 ENCOUNTER — Ambulatory Visit: Payer: Self-pay | Admitting: Mental Health

## 2019-10-24 ENCOUNTER — Ambulatory Visit (INDEPENDENT_AMBULATORY_CARE_PROVIDER_SITE_OTHER): Payer: 59 | Admitting: Mental Health

## 2019-10-24 ENCOUNTER — Other Ambulatory Visit: Payer: Self-pay

## 2019-10-24 DIAGNOSIS — F419 Anxiety disorder, unspecified: Secondary | ICD-10-CM | POA: Diagnosis not present

## 2019-10-24 DIAGNOSIS — F902 Attention-deficit hyperactivity disorder, combined type: Secondary | ICD-10-CM

## 2019-10-24 NOTE — Progress Notes (Signed)
[facility] Counselor/Therapist Progress Note   Patient ID: [name], MRN: [id number]  Date: 10/24/2019  Timespent: 51 minutes  Treatment Type: Individual therapy  Mental Status Exam:   Appearance:   Casual     Behavior:  Appropriate  Motor:  Normal  Speech/Language:   Normal Rate, rhythm   Affect:  Full Range  Mood:  anxious, depressed, pleasant   Thought process:  Coherent and Relevant  Thought content:    Logical, goal directed  Perceptual disturbances:    none  Orientation:  Full (Time, Place, and Person)  Attention:  Good  Concentration:  good  Memory:  intact  Fund of knowledge:   Good  Insight:    Good  Judgment:   Good  Impulse Control:  good    Reported Symptoms:  Depressed mood, anxiety most days, rumination, fatigue, anhedonia, some sleep disturbances  Risk Assessment: Danger to Self:  No Self-injurious Behavior: No Danger to Others: No Duty to Warn:no Physical Aggression / Violence:No  Access to Firearms a concern: No  Gang Involvement:No   Patient / guardian was educated about steps to take if suicide or homicide risk level increases between visits. While future psychiatric events cannot be accurately predicted, the patient does not currently require acute inpatient psychiatric care and does not currently meet [facility] involuntary commitment criteria.  Subjective:  Patient presented for session sharing progress since her last session where she continues to recover from recent surgery.  She shared family relationships how she has had to set some boundaries while also utilizing some friendships for needed support during this time.  Continues to navigate options with her attorney through recent mediation.  She shared how she was offered a job interview with her company and she engaged in this interview yesterday and shared details.  She stated she will learn if she obtains the job Friday and after negotiation.  Through discovery, she  identified the challenges of the past year and how it has changed her while being receptive to feedback related to her ability to thrive during this time.  She continues to have some support from her brother and shared recently how her father had a heart attack.  Both patient and her brother are not closely connected their father due to their father's lack of engagement in their lives for many years.  She went on to process feelings about the same.  Ways to continue to cope, care for herself during this time was explored.  Patient plans to follow through with goals set with the next few days related to employment options.    Interventions: CBT, supportive therapy  Diagnosis:   ICD-10-CM   1. Anxiety  F41.9   2. Attention deficit hyperactivity disorder (ADHD), combined type  F90.2      Plan:  Patient is to apply thought blocking skill to decrease her anxiety about her upcoming medical appointment for her nerve block.  Patient to remind herself of utilizing calming self talk to keep stress low.  Patient to utilize her support system.  Long-term goal:   Identify the major life complexities from the past and present that form the basis for present feelings of anxiety and depression. Short-term goal:  Enhance ability to handle effectively maintain self care while coping with pain associated with medical conditions. Patient will improve her        Positive, realistic and idealistic self talk to keep feelings of anxiety and depression more infrequent.  Assessment of progress:  progressing   [place], [facility]

## 2019-10-29 ENCOUNTER — Ambulatory Visit (INDEPENDENT_AMBULATORY_CARE_PROVIDER_SITE_OTHER): Payer: 59 | Admitting: Mental Health

## 2019-10-29 ENCOUNTER — Other Ambulatory Visit: Payer: Self-pay

## 2019-10-29 ENCOUNTER — Ambulatory Visit (INDEPENDENT_AMBULATORY_CARE_PROVIDER_SITE_OTHER): Payer: 59 | Admitting: Psychiatry

## 2019-10-29 ENCOUNTER — Encounter: Payer: Self-pay | Admitting: Psychiatry

## 2019-10-29 DIAGNOSIS — F419 Anxiety disorder, unspecified: Secondary | ICD-10-CM

## 2019-10-29 DIAGNOSIS — F902 Attention-deficit hyperactivity disorder, combined type: Secondary | ICD-10-CM

## 2019-10-29 DIAGNOSIS — F331 Major depressive disorder, recurrent, moderate: Secondary | ICD-10-CM

## 2019-10-29 DIAGNOSIS — G4700 Insomnia, unspecified: Secondary | ICD-10-CM | POA: Diagnosis not present

## 2019-10-29 MED ORDER — GABAPENTIN 400 MG PO CAPS: ORAL_CAPSULE | ORAL | 0 refills | Status: DC

## 2019-10-29 MED ORDER — METHYLPHENIDATE HCL ER 18 MG PO TB24: 18.0000 mg | ORAL_TABLET | Freq: Every day | ORAL | 0 refills | Status: DC

## 2019-10-29 MED ORDER — HYDROXYZINE HCL 10 MG PO TABS: 10.0000 mg | ORAL_TABLET | Freq: Three times a day (TID) | ORAL | 0 refills | Status: DC | PRN

## 2019-10-29 MED ORDER — BUPROPION HCL ER (XL) 150 MG PO TB24: 300.0000 mg | ORAL_TABLET | Freq: Every day | ORAL | 0 refills | Status: DC

## 2019-10-29 NOTE — Progress Notes (Signed)
RIM THATCH [id number] 03/21/1966 54 y.o.  Subjective:   Patient ID:  [name] is a 54 y.o. (DOB 07-20-1966) female.  Chief Complaint:  Chief Complaint  Patient presents with  . Follow-up    ADD, Depression, Anxiety    HPI [name] presents to the office today for follow-up of ADD, anxiety, and depression. She reports that Concerta has been very helpful and that she has been able to focus and be more productive. She reports that she is now taking Wellbutrin XL 150 mg po qd. Noticed some slight depression and attributes this to forgetting to take Gabapentin. She reports that when she resumed taking Gabapentin consistently that her mood improved. She reports that she is less anxious since starting Concerta. She reports that her sleep has been ok. Appetite decreased slightly when she first started Concerta and has returned to baseline after 2 weeks. Energy and motivation have improved after resuming gabapentin. Denies SI.   Reports that she has not been exercising consistently  Reports resting HR is now in the 70's.  Past Psychiatric Medication Trials: Gabapentin- prescribed for nerve pain. Needs 600 mg to help with insomnia. Has been helpful for anxiety and pain.  Cymbalta- Gained 20 lbs in 3 months. Had severe discontinuation s/s.  Zoloft- Felt groggy and "spacey." Sexual side effects.  Prozac- felt groggy and spacey. "All I wanted to do was cry." Wellbutrin XL- Has been helpful for depression and has taken it periodically. Took 150 mg po qd. Last took around 2018.  Wellbutrin SR- Felt that 100 mg was ineffective. Alprazolam- Taken prn for 6 years. Diazepam Trazodone- jitteriness. Paradoxical effect. Hydroxyzine- effective for anxiety    PHQ2-9     Telemedicine from 07/04/2019 in [place] at [employer] [facility] from 06/13/2017 in [place] at [employer] Total Score  3  0       Review of Systems:  Review of Systems   Cardiovascular: Negative for palpitations.  Musculoskeletal: Positive for back pain. Negative for gait problem.  Neurological: Negative for tremors and headaches.  Psychiatric/Behavioral:       Please refer to HPI    Medications: I have reviewed the patient's current medications.  Current Outpatient Medications  Medication Sig Dispense Refill  . albuterol (VENTOLIN HFA) 108 (90 Base) MCG/ACT inhaler INHALE 2 PUFFS INTO THE LUNGS EVERY 4 (FOUR) HOURS AS NEEDED FOR WHEEZING OR SHORTNESS OF BREATH. 18 g 5  . ALPRAZolam (XANAX) 1 MG tablet Take 1 tablet (1 mg total) by mouth 2 (two) times daily as needed for anxiety. 60 tablet 2  . ASHWAGANDHA PO Take by mouth.    Marland Kitchen buPROPion (WELLBUTRIN XL) 150 MG 24 hr tablet Take 2 tablets (300 mg total) by mouth daily. 180 tablet 0  . fluticasone (FLONASE) 50 MCG/ACT nasal spray SPRAY 2 SPRAYS INTO EACH NOSTRIL EVERY DAY 48 mL 2  . Fluticasone Propionate, Inhal, (FLOVENT DISKUS) 100 MCG/BLIST AEPB INHALE 100 MCG INTO THE LUNGS DAILY. (Patient taking differently: Take 100 mcg by mouth daily. ) 60 each 5  . gabapentin (NEURONTIN) 400 MG capsule TAKE 1 CAPSULE BY MOUTH 3 TIMES A DAY AND 2 CAPSULES AT BEDTIME 450 capsule 0  . hydrOXYzine (ATARAX/VISTARIL) 10 MG tablet Take 1 tablet (10 mg total) by mouth 3 (three) times daily as needed. 270 tablet 0  . ibuprofen (ADVIL) 800 MG tablet Take 800 mg by mouth every 8 (eight) hours as needed.    . meloxicam (MOBIC) 15 MG tablet Mobic  15 mg tablet   1 tablet every day by oral route.    . methylphenidate 18 MG PO CR tablet Take 1 tablet (18 mg total) by mouth daily. 30 tablet 0  . montelukast (SINGULAIR) 10 MG tablet Take 1 tablet (10 mg total) by mouth at bedtime. 90 tablet 3  . rizatriptan (MAXALT-MLT) 10 MG disintegrating tablet Take 1 tablet (10 mg total) by mouth as needed for migraine. May repeat in 2 hours if needed 30 tablet 3  . amoxicillin-clavulanate (AUGMENTIN) 875-125 MG tablet Take 1 tablet by mouth 2  (two) times daily. (Patient not taking: Reported on 09/10/2019) 20 tablet 0  . [START ON 11/26/2019] methylphenidate 18 MG PO CR tablet Take 1 tablet (18 mg total) by mouth daily. 30 tablet 0  . [START ON 12/24/2019] methylphenidate 18 MG PO CR tablet Take 1 tablet (18 mg total) by mouth daily. 30 tablet 0  . methylPREDNISolone (MEDROL DOSEPAK) 4 MG TBPK tablet As directed (Patient not taking: Reported on 09/10/2019) 21 tablet 0  . ondansetron (ZOFRAN) 4 MG tablet Take 1 tablet (4 mg total) by mouth every 8 (eight) hours as needed for nausea or vomiting. (Patient not taking: Reported on 10/29/2019) 20 tablet 0  . ondansetron (ZOFRAN) 4 MG tablet Zofran 4 mg tablet  Take 2 tablets every 8 hours by oral route as needed for 5 days.  Take 1-2 tabs every 8 hours as needed for nausea    . traMADol (ULTRAM) 50 MG tablet Take 50 mg by mouth 3 (three) times daily as needed.    . valACYclovir (VALTREX) 500 MG tablet Take 1 tablet (500 mg total) by mouth 2 (two) times daily. (Patient not taking: Reported on 09/10/2019) 180 tablet 3   No current facility-administered medications for this visit.    Medication Side Effects: None  Allergies: No Known Allergies  Past Medical History:  Diagnosis Date  . Anxiety   . Asthma    seasonal  . Headache   . Pelvic pain     Family History  Problem Relation Age of Onset  . Hypertension Mother   . Anxiety disorder Mother   . Depression Mother   . Breast cancer Other        GREAT AUNT  . ADD / ADHD Father     Social History   Socioeconomic History  . Marital status: Divorced    Spouse name: Not on file  . Number of children: Not on file  . Years of education: Not on file  . Highest education level: Not on file  Occupational History  . Not on file  Tobacco Use  . Smoking status: Never Smoker  . Smokeless tobacco: Never Used  Substance and Sexual Activity  . Alcohol use: Not Currently    Alcohol/week: 1.0 standard drinks    Types: 1 Glasses of wine  per week  . Drug use: No  . Sexual activity: Not Currently  Other Topics Concern  . Not on file  Social History Narrative  . Not on file   Social Determinants of Health   Financial Resource Strain:   . Difficulty of Paying Living Expenses: Not on file  Food Insecurity:   . Worried About Programme researcher, broadcasting/film/video in the Last Year: Not on file  . Ran Out of Food in the Last Year: Not on file  Transportation Needs:   . Lack of Transportation (Medical): Not on file  . Lack of Transportation (Non-Medical): Not on file  Physical Activity:   .  Days of Exercise per Week: Not on file  . Minutes of Exercise per Session: Not on file  Stress:   . Feeling of Stress : Not on file  Social Connections:   . Frequency of Communication with Friends and Family: Not on file  . Frequency of Social Gatherings with Friends and Family: Not on file  . Attends Religious Services: Not on file  . Active Member of Clubs or Organizations: Not on file  . Attends Banker Meetings: Not on file  . Marital Status: Not on file  Intimate Partner Violence:   . Fear of Current or Ex-Partner: Not on file  . Emotionally Abused: Not on file  . Physically Abused: Not on file  . Sexually Abused: Not on file    Past Medical History, Surgical history, Social history, and Family history were reviewed and updated as appropriate.   Please see review of systems for further details on the patient's review from today.   Objective:   Physical Exam:  BP 101/71   Pulse 73   LMP 02/19/2009 (LMP Unknown)   Physical Exam Constitutional:      General: She is not in acute distress.    Appearance: She is well-developed.  Musculoskeletal:        General: No deformity.  Neurological:     Mental Status: She is alert and oriented to person, place, and time.     Coordination: Coordination normal.  Psychiatric:        Attention and Perception: Attention and perception normal. She does not perceive auditory or visual  hallucinations.        Mood and Affect: Mood normal. Mood is not anxious or depressed. Affect is not labile, blunt, angry or inappropriate.        Speech: Speech normal.        Behavior: Behavior normal.        Thought Content: Thought content normal. Thought content is not paranoid or delusional. Thought content does not include homicidal or suicidal ideation. Thought content does not include homicidal or suicidal plan.        Cognition and Memory: Cognition and memory normal.        Judgment: Judgment normal.     Comments: Insight intact     Lab Review:     Component Value Date/Time   NA 140 08/15/2019 1007   K 3.9 08/15/2019 1007   CL 103 08/15/2019 1007   CO2 28 08/15/2019 1007   GLUCOSE 98 08/15/2019 1007   BUN 12 08/15/2019 1007   CREATININE 1.05 08/15/2019 1007   CREATININE 0.83 03/13/2017 1211   CALCIUM 9.7 08/15/2019 1007   PROT 7.1 03/15/2018 0846   ALBUMIN 4.3 03/15/2018 0846   AST 13 03/15/2018 0846   ALT 9 03/15/2018 0846   ALKPHOS 97 03/15/2018 0846   BILITOT 0.3 03/15/2018 0846   GFRNONAA >60 01/30/2019 1424   GFRAA >60 01/30/2019 1424       Component Value Date/Time   WBC 12.6 (H) 01/30/2019 1424   RBC 4.06 01/30/2019 1424   HGB 12.2 01/30/2019 1424   HCT 36.8 01/30/2019 1424   PLT 229 01/30/2019 1424   MCV 90.6 01/30/2019 1424   MCH 30.0 01/30/2019 1424   MCHC 33.2 01/30/2019 1424   RDW 13.2 01/30/2019 1424   LYMPHSABS 1.3 03/15/2018 0846   MONOABS 0.5 03/15/2018 0846   EOSABS 0.1 03/15/2018 0846   BASOSABS 0.0 03/15/2018 0846    No results found for: POCLITH, LITHIUM  No results found for: PHENYTOIN, PHENOBARB, VALPROATE, CBMZ   .res Assessment: Plan:   Will continue current plan of care since target signs and symptoms are well controlled without any tolerability issues. Pt to f/u in 3 months or sooner if clinically indicated.  Recommend continuing psychotherapy with Elio Forget, LPC. Patient advised to contact office with any questions,  adverse effects, or acute worsening in signs and symptoms.  [name] was seen today for follow-up.  Diagnoses and all orders for this visit:  Attention deficit hyperactivity disorder (ADHD), combined type -     methylphenidate 18 MG PO CR tablet; Take 1 tablet (18 mg total) by mouth daily. -     methylphenidate 18 MG PO CR tablet; Take 1 tablet (18 mg total) by mouth daily. -     methylphenidate 18 MG PO CR tablet; Take 1 tablet (18 mg total) by mouth daily.  Moderate recurrent major depression (HCC) -     buPROPion (WELLBUTRIN XL) 150 MG 24 hr tablet; Take 2 tablets (300 mg total) by mouth daily.  Anxiety -     hydrOXYzine (ATARAX/VISTARIL) 10 MG tablet; Take 1 tablet (10 mg total) by mouth 3 (three) times daily as needed. -     gabapentin (NEURONTIN) 400 MG capsule; TAKE 1 CAPSULE BY MOUTH 3 TIMES A DAY AND 2 CAPSULES AT BEDTIME  Insomnia, unspecified type -     gabapentin (NEURONTIN) 400 MG capsule; TAKE 1 CAPSULE BY MOUTH 3 TIMES A DAY AND 2 CAPSULES AT BEDTIME     Please see After Visit Summary for patient specific instructions.  Future Appointments  Date Time Provider Department Center  11/12/2019 10:00 AM Waldron Session, [facility] None  11/20/2019 10:00 AM Waldron Session, [facility] None    No orders of the defined types were placed in this encounter.   -------------------------------

## 2019-10-29 NOTE — Progress Notes (Signed)
         [facility] Counselor/Therapist Progress Note   Patient ID: [name], MRN: [id number]  Date: 10/29/2019  Timespent: 52 minutes  Treatment Type: Individual therapy  Mental Status Exam:   Appearance:   Casual     Behavior:  Appropriate  Motor:  Normal  Speech/Language:   Normal Rate, rhythm   Affect:  Full Range  Mood:  anxious, depressed, pleasant   Thought process:  Coherent and Relevant  Thought content:    Logical, goal directed  Perceptual disturbances:    none  Orientation:  Full (Time, Place, and Person)  Attention:  Good  Concentration:  good  Memory:  intact  Fund of knowledge:   Good  Insight:    Good  Judgment:   Good  Impulse Control:  good    Reported Symptoms:  Depressed mood, anxiety most days, rumination, fatigue, anhedonia, some sleep disturbances  Risk Assessment: Danger to Self:  No Self-injurious Behavior: No Danger to Others: No Duty to Warn:no Physical Aggression / Violence:No  Access to Firearms a concern: No  Gang Involvement:No   Patient / guardian was educated about steps to take if suicide or homicide risk level increases between visits. While future psychiatric events cannot be accurately predicted, the patient does not currently require acute inpatient psychiatric care and does not currently meet [facility] involuntary commitment criteria.  Subjective:  Patient arrived on time for today's session in no apparent distress.  She shared changes, how her father continues to recover from his recent heart attack, remains in the hospital.  She shared how this affected her and her brother as the hospital has been reaching out for financial reconciliation.  Patient shared how her stepbrother, who lives locally near her father, is providing the support needed.  She shared how she continues to await results from a recent job interview, is hopeful.  She continues to navigate the continued financial stress, also having to make  multiple phone calls to keep her insurance active as this is been a repetitive issue over the last few months.  Timeframes are approaching for potential finalization of her Workmen's Compensation litigation through mediation.  Family relationships were processed.  Ways she continues to set some emotional boundaries with her relationship with her mother has been continued.  Ways to continue to cope, care for herself were explored and reviewed.  Interventions: CBT, supportive therapy  Diagnosis:   ICD-10-CM   1. Moderate recurrent major depression (HCC)  F33.1   2. Anxiety  F41.9   3. Attention deficit hyperactivity disorder (ADHD), combined type  F90.2      Plan:  Patient is to apply thought blocking skill to decrease her anxiety about her upcoming medical appointment for her nerve block.  Patient to continue to advocate for her needs related to medical and in relationships.  Long-term goal:   Identify the major life complexities from the past and present that form the basis for present feelings of anxiety and depression. Short-term goal:  Enhance ability to handle effectively maintain self care while coping with pain associated with medical conditions. Patient will improve her        Positive, realistic and idealistic self talk to keep feelings of anxiety and depression more infrequent.   Assessment of progress:  progressing   Waldron Session, [facility]

## 2019-11-09 ENCOUNTER — Other Ambulatory Visit: Payer: Self-pay | Admitting: Psychiatry

## 2019-11-09 DIAGNOSIS — F331 Major depressive disorder, recurrent, moderate: Secondary | ICD-10-CM

## 2019-11-11 ENCOUNTER — Other Ambulatory Visit: Payer: Self-pay | Admitting: Family Medicine

## 2019-11-11 DIAGNOSIS — J4520 Mild intermittent asthma, uncomplicated: Secondary | ICD-10-CM

## 2019-11-11 DIAGNOSIS — J3089 Other allergic rhinitis: Secondary | ICD-10-CM

## 2019-11-12 ENCOUNTER — Ambulatory Visit: Payer: Self-pay | Admitting: Mental Health

## 2019-11-13 ENCOUNTER — Ambulatory Visit: Payer: 59 | Admitting: Mental Health

## 2019-11-14 ENCOUNTER — Ambulatory Visit: Payer: 59 | Admitting: Mental Health

## 2019-11-20 ENCOUNTER — Other Ambulatory Visit: Payer: Self-pay

## 2019-11-20 ENCOUNTER — Ambulatory Visit (INDEPENDENT_AMBULATORY_CARE_PROVIDER_SITE_OTHER): Payer: 59 | Admitting: Mental Health

## 2019-11-20 DIAGNOSIS — F331 Major depressive disorder, recurrent, moderate: Secondary | ICD-10-CM

## 2019-11-20 DIAGNOSIS — F902 Attention-deficit hyperactivity disorder, combined type: Secondary | ICD-10-CM | POA: Diagnosis not present

## 2019-11-20 NOTE — Progress Notes (Signed)
         [facility] Counselor/Therapist Progress Note   Patient ID: [name], MRN: [id number]  Date: 11/20/2019  Timespent: 52 minutes  Treatment Type: Individual therapy  Mental Status Exam:   Appearance:   Casual     Behavior:  Appropriate  Motor:  Normal  Speech/Language:   Normal Rate, rhythm   Affect:  Full Range  Mood:  anxious, depressed, pleasant   Thought process:  Coherent and Relevant  Thought content:    Logical, goal directed  Perceptual disturbances:    none  Orientation:  Full (Time, Place, and Person)  Attention:  Good  Concentration:  good  Memory:  intact  Fund of knowledge:   Good  Insight:    Good  Judgment:   Good  Impulse Control:  good    Reported Symptoms:  Depressed mood, anxiety most days, rumination, fatigue, anhedonia, some sleep disturbances  Risk Assessment: Danger to Self:  No Self-injurious Behavior: No Danger to Others: No Duty to Warn:no Physical Aggression / Violence:No  Access to Firearms a concern: No  Gang Involvement:No   Patient / guardian was educated about steps to take if suicide or homicide risk level increases between visits. While future psychiatric events cannot be accurately predicted, the patient does not currently require acute inpatient psychiatric care and does not currently meet [facility] involuntary commitment criteria.  Subjective:  Patient arrived for session in no apparent distress.  She shared recent changes.  She stated her father continues to recover from his recent heart attack.  She shared how she and her brother have a strained relationship currently as she attempted to set some boundaries regarding how their mother can misrepresent patient.  She shared how she had some relieving the events, how she was able to finalize their Workmen's Comp. injury settlement which will assist her in relieving significant financial stress.  Patient appears more hopeful about her situation and also shared  efforts toward securing employment as she had a recent job interview.  Patient was encouraged to allow herself time to get emotional rest and allow herself to carefully plan the next steps of her life as this significant stressor has been decreased.  She continues to cope with significant medical issues and she plans to carefully look her next steps which include continued self care particularly with her ongoing medical issues.  Interventions: CBT, supportive therapy  Diagnosis:   ICD-10-CM   1. Moderate recurrent major depression (HCC)  F33.1   2. Attention deficit hyperactivity disorder (ADHD), combined type  F90.2      Plan:  Patient is to apply thought blocking skill to decrease her anxiety about her upcoming medical appointment for her nerve block.  Patient to continue to advocate for her needs related to medical and in relationships.  Long-term goal:   Identify the major life complexities from the past and present that form the basis for present feelings of anxiety and depression. Short-term goal:  Enhance ability to handle effectively maintain self care while coping with pain associated with medical conditions. Patient will improve her        Positive, realistic and idealistic self talk to keep feelings of anxiety and depression more infrequent.   Assessment of progress:  progressing   Waldron Session, [facility]

## 2019-11-26 ENCOUNTER — Other Ambulatory Visit: Payer: Self-pay | Admitting: Family Medicine

## 2019-11-26 DIAGNOSIS — J3089 Other allergic rhinitis: Secondary | ICD-10-CM

## 2019-12-03 ENCOUNTER — Other Ambulatory Visit: Payer: Self-pay

## 2019-12-03 ENCOUNTER — Telehealth (INDEPENDENT_AMBULATORY_CARE_PROVIDER_SITE_OTHER): Payer: 59 | Admitting: Family Medicine

## 2019-12-03 DIAGNOSIS — J019 Acute sinusitis, unspecified: Secondary | ICD-10-CM

## 2019-12-03 MED ORDER — METHYLPREDNISOLONE 4 MG PO TBPK: ORAL_TABLET | ORAL | 0 refills | Status: DC

## 2019-12-03 MED ORDER — AMOXICILLIN-POT CLAVULANATE 875-125 MG PO TABS: 1.0000 | ORAL_TABLET | Freq: Two times a day (BID) | ORAL | 0 refills | Status: DC

## 2019-12-03 NOTE — Progress Notes (Signed)
Virtual Visit via Telephone Note  I connected with the patient on 12/03/19 at 11:15 AM EST by telephone and verified that I am speaking with the correct person using two identifiers.   I discussed the limitations, risks, security and privacy concerns of performing an evaluation and management service by telephone and the availability of in person appointments. I also discussed with the patient that there may be a patient responsible charge related to this service. The patient expressed understanding and agreed to proceed.  Location patient: home Location provider: work or home office Participants present for the call: patient, provider Patient did not have a visit in the prior 7 days to address this/these issue(s).   History of Present Illness: Here for one week of sinus pressure, PND, and a dry cough. No fever.    Observations/Objective: Patient sounds cheerful and well on the phone. I do not appreciate any SOB. Speech and thought processing are grossly intact. Patient reported vitals:  Assessment and Plan: Sinusitis, treat with Augmentin and a Medrol dose pack. Gershon Crane, MD   Follow Up Instructions:     [id number] 5-10 [phone] 11-20 [id number] 21-30 I did not refer this patient for an OV in the next 24 hours for this/these issue(s).  I discussed the assessment and treatment plan with the patient. The patient was provided an opportunity to ask questions and all were answered. The patient agreed with the plan and demonstrated an understanding of the instructions.   The patient was advised to call back or seek an in-person evaluation if the symptoms worsen or if the condition fails to improve as anticipated.  I provided 12 minutes of non-face-to-face time during this encounter.   Gershon Crane, MD

## 2019-12-04 ENCOUNTER — Encounter: Payer: Self-pay | Admitting: Family Medicine

## 2019-12-04 NOTE — Telephone Encounter (Signed)
Noted  

## 2019-12-16 ENCOUNTER — Ambulatory Visit: Payer: 59 | Admitting: Mental Health

## 2019-12-16 ENCOUNTER — Ambulatory Visit: Payer: 59 | Admitting: Psychiatry

## 2019-12-18 ENCOUNTER — Other Ambulatory Visit: Payer: Self-pay

## 2019-12-18 ENCOUNTER — Ambulatory Visit (INDEPENDENT_AMBULATORY_CARE_PROVIDER_SITE_OTHER): Payer: 59 | Admitting: Mental Health

## 2019-12-18 DIAGNOSIS — F331 Major depressive disorder, recurrent, moderate: Secondary | ICD-10-CM | POA: Diagnosis not present

## 2019-12-18 NOTE — Progress Notes (Signed)
         [facility] Counselor/Therapist Progress Note   Patient ID: ZAWADI APLIN, MRN: [id number]  Date: 12/18/2019  Timespent: 52 minutes  Treatment Type: Individual therapy  Mental Status Exam:   Appearance:   Casual     Behavior:  Appropriate  Motor:  Normal  Speech/Language:   Normal Rate, rhythm   Affect:  Full Range  Mood:  anxious, depressed, pleasant   Thought process:  Coherent and Relevant  Thought content:    Logical, goal directed  Perceptual disturbances:    none  Orientation:  Full (Time, Place, and Person)  Attention:  Good  Concentration:  good  Memory:  intact  Fund of knowledge:   Good  Insight:    Good  Judgment:   Good  Impulse Control:  good    Reported Symptoms:  Depressed mood, anxiety most days, rumination, fatigue, anhedonia, some sleep disturbances  Risk Assessment: Danger to Self:  No Self-injurious Behavior: No Danger to Others: No Duty to Warn:no Physical Aggression / Violence:No  Access to Firearms a concern: No  Gang Involvement:No   Patient / guardian was educated about steps to take if suicide or homicide risk level increases between visits. While future psychiatric events cannot be accurately predicted, the patient does not currently require acute inpatient psychiatric care and does not currently meet [facility] involuntary commitment criteria.  Subjective:  Patient went w/ her brother to see their father who has been coping with significant medical issues, having a recent heart attack.  The session was centered around the efforts she and her brothers put forth in caring for their father, detailing how she was able to find a suitable place for him to live getting the needed medical care and day-to-day needs met.  She processed many feelings related to this process that occurred over a weeks timeframe.  She reports some positive outcomes from her recent surgery and recovery, reporting some decrease in daily pain levels.  She  shared how her litigation case concluded and she is had some financial relief as a result which is affected many areas of her life in lowering some stress levels.  Ways to continue to cope and care for herself were explored while providing support and encouragement as she identified steps toward health and wellness with a recent diet change toward healthier food options.  Interventions: CBT, supportive therapy  Diagnosis:   ICD-10-CM   1. Moderate recurrent major depression ([place])  F33.1      Plan:  Patient is to apply thought blocking skill to decrease her anxiety about her upcoming medical appointment for her nerve block.  Patient to continue to advocate for her needs related to medical and in relationships.  Long-term goal:   Identify the major life complexities from the past and present that form the basis for present feelings of anxiety and depression. Short-term goal:  Enhance ability to handle effectively maintain self care while coping with pain associated with medical conditions. Patient will improve her        Positive, realistic and idealistic self talk to keep feelings of anxiety and depression more infrequent.   Assessment of progress:  progressing   [place]son [place], [facility]

## 2019-12-23 ENCOUNTER — Other Ambulatory Visit: Payer: Self-pay

## 2019-12-23 ENCOUNTER — Ambulatory Visit (INDEPENDENT_AMBULATORY_CARE_PROVIDER_SITE_OTHER): Payer: 59 | Admitting: Psychiatry

## 2019-12-23 ENCOUNTER — Encounter: Payer: Self-pay | Admitting: Psychiatry

## 2019-12-23 DIAGNOSIS — F331 Major depressive disorder, recurrent, moderate: Secondary | ICD-10-CM | POA: Diagnosis not present

## 2019-12-23 DIAGNOSIS — F902 Attention-deficit hyperactivity disorder, combined type: Secondary | ICD-10-CM

## 2019-12-23 DIAGNOSIS — F419 Anxiety disorder, unspecified: Secondary | ICD-10-CM

## 2019-12-23 DIAGNOSIS — G4700 Insomnia, unspecified: Secondary | ICD-10-CM | POA: Diagnosis not present

## 2019-12-23 MED ORDER — METHYLPHENIDATE HCL ER 18 MG PO TB24: 36.0000 mg | ORAL_TABLET | Freq: Every day | ORAL | 0 refills | Status: DC

## 2019-12-23 MED ORDER — GABAPENTIN 400 MG PO CAPS: ORAL_CAPSULE | ORAL | 0 refills | Status: DC

## 2019-12-23 MED ORDER — HYDROXYZINE HCL 10 MG PO TABS: 10.0000 mg | ORAL_TABLET | Freq: Three times a day (TID) | ORAL | 0 refills | Status: DC | PRN

## 2019-12-23 MED ORDER — ALPRAZOLAM 1 MG PO TABS: 1.0000 mg | ORAL_TABLET | Freq: Two times a day (BID) | ORAL | 2 refills | Status: DC | PRN

## 2019-12-23 MED ORDER — BUPROPION HCL ER (XL) 150 MG PO TB24: 300.0000 mg | ORAL_TABLET | Freq: Every day | ORAL | 0 refills | Status: DC

## 2019-12-23 NOTE — Progress Notes (Signed)
[name] [id number] [birth year] 54 y.o.  Subjective:   Patient ID:  [name] is a 54 y.o. (DOB 11-28-1965) female.  Chief Complaint:  Chief Complaint  Patient presents with  . Depression  . ADD  . Follow-up    h/o Anxiety    HPI [name] presents to the office today for follow-up of depression and anxiety. She reports that Wellbutrin XL 150 mg seems ineffective for depressive s/s. Mood has been low at times. Energy and motivation have been low. She reports that she is sleeping ok as long as she takes Alprazolam. Estimates sleeping 5-7 hours a night. Has been waking up in the middle of the night with mind racing. Appetite is less with Concerta and is trying to make a concerted effort to eat regularly and eat healthy food. She reports that she feels "scattered." She reports that Concerta 18 mg po qd has been somewhat helpful but continues to experience some difficulty with concentration. She reports that being able to focus some has had a positive effect on her self-esteem. She reports that she tried not taking Concerta some weekends and noticed more anxiety and difficulty accomplishing tasks. Denies SI.   Reports that she recently reconciled with her father and had to place him in LTC facility.   Has had covid vaccination. Plans to start swimming again now that she is vaccinated.  Past Psychiatric Medication Trials: Gabapentin- prescribed for nerve pain. Needs 600 mg to help with insomnia. Has been helpful for anxiety and pain.  Cymbalta- Gained 20 lbs in 3 months. Had severe discontinuation s/s.  Zoloft- Felt groggy and "spacey." Sexual side effects.  Prozac- felt groggy and spacey. "All I wanted to do was cry." Wellbutrin XL- Has been helpful for depression and has taken it periodically. Took 150 mg po qd. Last took around 2018.  Wellbutrin SR- Felt that 100 mg was ineffective. Alprazolam- Taken prn for 6 years. Diazepam Trazodone- jitteriness.  Paradoxical effect. Hydroxyzine- effective for anxiety    PHQ2-9     Telemedicine from 07/04/2019 in [facility] at [employer] from 06/13/2017 in [facility] at [employer] [facility] Total Score  3  0       Review of Systems:  Review of Systems  Cardiovascular: Negative for palpitations.  Musculoskeletal: Negative for gait problem.  Neurological: Negative for tremors.  Psychiatric/Behavioral:       Please refer to HPI    Medications: I have reviewed the patient's current medications.  Current Outpatient Medications  Medication Sig Dispense Refill  . albuterol (VENTOLIN HFA) 108 (90 Base) MCG/ACT inhaler INHALE 2 PUFFS INTO THE LUNGS EVERY 4 (FOUR) HOURS AS NEEDED FOR WHEEZING OR SHORTNESS OF BREATH. 18 g 5  . [START ON 01/06/2020] ALPRAZolam (XANAX) 1 MG tablet Take 1 tablet (1 mg total) by mouth 2 (two) times daily as needed for anxiety. 60 tablet 2  . amoxicillin-clavulanate (AUGMENTIN) 875-125 MG tablet Take 1 tablet by mouth 2 (two) times daily. 20 tablet 0  . ASHWAGANDHA PO Take by mouth.    Marland Kitchen buPROPion (WELLBUTRIN XL) 150 MG 24 hr tablet Take 2 tablets (300 mg total) by mouth daily. 180 tablet 0  . fluticasone (FLONASE) 50 MCG/ACT nasal spray SPRAY 2 SPRAYS INTO EACH NOSTRIL EVERY DAY 48 mL 2  . Fluticasone Propionate, Inhal, (FLOVENT DISKUS) 100 MCG/BLIST AEPB INHALE 1 PUFF INTO THE LUNGS DAILY 180 each 1  . gabapentin (NEURONTIN) 400 MG capsule TAKE 1 CAPSULE BY MOUTH 3 TIMES A  DAY AND 2 CAPSULES AT BEDTIME 450 capsule 0  . hydrOXYzine (ATARAX/VISTARIL) 10 MG tablet Take 1 tablet (10 mg total) by mouth 3 (three) times daily as needed. 270 tablet 0  . ibuprofen (ADVIL) 800 MG tablet Take 800 mg by mouth every 8 (eight) hours as needed.    . meloxicam (MOBIC) 15 MG tablet Mobic 15 mg tablet   1 tablet every day by oral route.    Melene Muller ON 12/24/2019] methylphenidate 18 MG PO CR tablet Take 2 tablets (36 mg total) by mouth daily. 60 tablet 0  .  methylPREDNISolone (MEDROL DOSEPAK) 4 MG TBPK tablet As directed 21 tablet 0  . montelukast (SINGULAIR) 10 MG tablet TAKE 1 TABLET BY MOUTH EVERYDAY AT BEDTIME 90 tablet 3  . ondansetron (ZOFRAN) 4 MG tablet Take 1 tablet (4 mg total) by mouth every 8 (eight) hours as needed for nausea or vomiting. (Patient not taking: Reported on 10/29/2019) 20 tablet 0  . ondansetron (ZOFRAN) 4 MG tablet Zofran 4 mg tablet  Take 2 tablets every 8 hours by oral route as needed for 5 days.  Take 1-2 tabs every 8 hours as needed for nausea    . rizatriptan (MAXALT-MLT) 10 MG disintegrating tablet Take 1 tablet (10 mg total) by mouth as needed for migraine. May repeat in 2 hours if needed 30 tablet 3  . traMADol (ULTRAM) 50 MG tablet Take 50 mg by mouth 3 (three) times daily as needed.    . valACYclovir (VALTREX) 500 MG tablet Take 1 tablet (500 mg total) by mouth 2 (two) times daily. (Patient not taking: Reported on 09/10/2019) 180 tablet 3   No current facility-administered medications for this visit.    Medication Side Effects: Appetite Suppression and Other: Notices "rebounding" effect when Concerta wears off (edginess)  Allergies: No Known Allergies  Past Medical History:  Diagnosis Date  . Anxiety   . Asthma    seasonal  . Headache   . Pelvic pain     Family History  Problem Relation Age of Onset  . Hypertension Mother   . Anxiety disorder Mother   . Depression Mother   . Breast cancer Other        GREAT AUNT  . ADD / ADHD Father     Social History   Socioeconomic History  . Marital status: Divorced    Spouse name: Not on file  . Number of children: Not on file  . Years of education: Not on file  . Highest education level: Not on file  Occupational History  . Not on file  Tobacco Use  . Smoking status: Never Smoker  . Smokeless tobacco: Never Used  Substance and Sexual Activity  . Alcohol use: Not Currently    Alcohol/week: 1.0 standard drinks    Types: 1 Glasses of wine per week   . Drug use: No  . Sexual activity: Not Currently  Other Topics Concern  . Not on file  Social History Narrative  . Not on file   Social Determinants of Health   Financial Resource Strain:   . Difficulty of Paying Living Expenses:   Food Insecurity:   . Worried About Programme researcher, broadcasting/film/video in the Last Year:   . Barista in the Last Year:   Transportation Needs:   . Freight forwarder (Medical):   Marland Kitchen Lack of Transportation (Non-Medical):   Physical Activity:   . Days of Exercise per Week:   . Minutes of Exercise per  Session:   Stress:   . Feeling of Stress :   Social Connections:   . Frequency of Communication with Friends and Family:   . Frequency of Social Gatherings with Friends and Family:   . Attends Religious Services:   . Active Member of Clubs or Organizations:   . Attends Archivist Meetings:   Marland Kitchen Marital Status:   Intimate Partner Violence:   . Fear of Current or Ex-Partner:   . Emotionally Abused:   Marland Kitchen Physically Abused:   . Sexually Abused:     Past Medical History, Surgical history, Social history, and Family history were reviewed and updated as appropriate.   Please see review of systems for further details on the patient's review from today.   Objective:   Physical Exam:  BP 100/75   Pulse 72   Wt 117 lb (53.1 kg)   LMP 02/19/2009 (LMP Unknown)   BMI 18.32 kg/m   Physical Exam Constitutional:      General: She is not in acute distress. Musculoskeletal:        General: No deformity.  Neurological:     Mental Status: She is alert and oriented to person, place, and time.     Coordination: Coordination normal.  Psychiatric:        Attention and Perception: Perception normal. She is inattentive. She does not perceive auditory or visual hallucinations.        Mood and Affect: Mood is depressed. Mood is not anxious. Affect is not labile, blunt, angry or inappropriate.        Speech: Speech normal.        Behavior: Behavior normal.         Thought Content: Thought content normal. Thought content is not paranoid or delusional. Thought content does not include homicidal or suicidal ideation. Thought content does not include homicidal or suicidal plan.        Cognition and Memory: Cognition and memory normal.        Judgment: Judgment normal.     Comments: Insight intact     Lab Review:     Component Value Date/Time   NA 140 08/15/2019 1007   K 3.9 08/15/2019 1007   CL 103 08/15/2019 1007   CO2 28 08/15/2019 1007   GLUCOSE 98 08/15/2019 1007   BUN 12 08/15/2019 1007   CREATININE 1.05 08/15/2019 1007   CREATININE 0.83 03/13/2017 1211   CALCIUM 9.7 08/15/2019 1007   PROT 7.1 03/15/2018 0846   ALBUMIN 4.3 03/15/2018 0846   AST 13 03/15/2018 0846   ALT 9 03/15/2018 0846   ALKPHOS 97 03/15/2018 0846   BILITOT 0.3 03/15/2018 0846   GFRNONAA >60 01/30/2019 1424   GFRAA >60 01/30/2019 1424       Component Value Date/Time   WBC 12.6 (H) 01/30/2019 1424   RBC 4.06 01/30/2019 1424   HGB 12.2 01/30/2019 1424   HCT 36.8 01/30/2019 1424   PLT 229 01/30/2019 1424   MCV 90.6 01/30/2019 1424   MCH 30.0 01/30/2019 1424   MCHC 33.2 01/30/2019 1424   RDW 13.2 01/30/2019 1424   LYMPHSABS 1.3 03/15/2018 0846   MONOABS 0.5 03/15/2018 0846   EOSABS 0.1 03/15/2018 0846   BASOSABS 0.0 03/15/2018 0846    No results found for: POCLITH, LITHIUM   No results found for: PHENYTOIN, PHENOBARB, VALPROATE, CBMZ   .res Assessment: Plan:   Will increase Concerta to 36 mg po q am. Pt requests two 18 mg tabs. Discussed script may need  to be changed to 36 mg tab due to insurance reasons.  Agree with increase of Wellbutrin XL to 300 mg po qd for depression.  Continue Gabapentin 400 mg po TID and 800 mg po QHS and anxiety and insomnia. Continue Alprazolam 1 mg po BID prn anxiety.  Pt to f/u in 4 weeks or sooner if clinically indicated.  Recommend continuing therapy with Royetta Asal, [facility].   Patient advised to contact office with  any questions, adverse effects, or acute worsening in signs and symptoms.  Zorana was seen today for depression, add and follow-up.  Diagnoses and all orders for this visit:  Attention deficit hyperactivity disorder (ADHD), combined type -     methylphenidate 18 MG PO CR tablet; Take 2 tablets (36 mg total) by mouth daily.  Anxiety -     ALPRAZolam (XANAX) 1 MG tablet; Take 1 tablet (1 mg total) by mouth 2 (two) times daily as needed for anxiety. -     gabapentin (NEURONTIN) 400 MG capsule; TAKE 1 CAPSULE BY MOUTH 3 TIMES A DAY AND 2 CAPSULES AT BEDTIME -     hydrOXYzine (ATARAX/VISTARIL) 10 MG tablet; Take 1 tablet (10 mg total) by mouth 3 (three) times daily as needed.  Insomnia, unspecified type -     gabapentin (NEURONTIN) 400 MG capsule; TAKE 1 CAPSULE BY MOUTH 3 TIMES A DAY AND 2 CAPSULES AT BEDTIME  Moderate recurrent major depression (HCC) -     buPROPion (WELLBUTRIN XL) 150 MG 24 hr tablet; Take 2 tablets (300 mg total) by mouth daily.     Please see After Visit Summary for patient specific instructions.  Future Appointments  Date Time Provider Department Center  01/01/2020  9:30 AM Corie Chiquito, PMHNP [facility] None  01/01/2020 10:00 AM Waldron Session, [facility] None  01/15/2020  9:00 AM Corie Chiquito, PMHNP [facility] None  01/15/2020 10:00 AM Waldron Session, [facility] None    No orders of the defined types were placed in this encounter.   -------------------------------

## 2019-12-28 ENCOUNTER — Encounter: Payer: Self-pay | Admitting: Family Medicine

## 2020-01-01 ENCOUNTER — Ambulatory Visit (INDEPENDENT_AMBULATORY_CARE_PROVIDER_SITE_OTHER): Payer: 59 | Admitting: Psychiatry

## 2020-01-01 ENCOUNTER — Encounter: Payer: Self-pay | Admitting: Psychiatry

## 2020-01-01 ENCOUNTER — Ambulatory Visit (INDEPENDENT_AMBULATORY_CARE_PROVIDER_SITE_OTHER): Payer: 59 | Admitting: Mental Health

## 2020-01-01 ENCOUNTER — Other Ambulatory Visit: Payer: Self-pay

## 2020-01-01 DIAGNOSIS — F331 Major depressive disorder, recurrent, moderate: Secondary | ICD-10-CM

## 2020-01-01 DIAGNOSIS — F902 Attention-deficit hyperactivity disorder, combined type: Secondary | ICD-10-CM | POA: Diagnosis not present

## 2020-01-01 DIAGNOSIS — F419 Anxiety disorder, unspecified: Secondary | ICD-10-CM | POA: Diagnosis not present

## 2020-01-01 DIAGNOSIS — G4700 Insomnia, unspecified: Secondary | ICD-10-CM | POA: Diagnosis not present

## 2020-01-01 NOTE — Progress Notes (Signed)
[name] [id number] 08/13/1966 54 y.o.  Subjective:   Patient ID:  [name] is a 54 y.o. (DOB Aug 06, 1966) female.  Chief Complaint: No chief complaint on file.   HPI [name] presents to the office today for follow-up of ADD, depression, and anxiety.  She reports that insurance denied Concerta 18 mg two tabs daily. Debating on whether she wants to increase Concerta at this time. Had to put her dog to sleep last night and reports that she is now in "shock" with sadness in response to this. Father has been having health issues and she is trying to help him. She reports that her back pain has improved and she was taking gabapentin and has noticed some increase in anxiety. Sleep was limited last night after dog dying.  Reports that she has not eaten since her dog died and plans to go home and eat. Has been trying to eat on a schedule. Denies SI.   PHQ2-9     Telemedicine from 07/04/2019 in [place] at [employer] from 06/13/2017 in [place] at [employer] Total Score  3  0       Review of Systems:  Review of Systems  Musculoskeletal: Negative for gait problem.       Reports improved nerve pain in back.   Neurological: Negative for tremors.  Psychiatric/Behavioral:       Please refer to HPI    Medications: I have reviewed the patient's current medications.  Current Outpatient Medications  Medication Sig Dispense Refill  . albuterol (VENTOLIN HFA) 108 (90 Base) MCG/ACT inhaler INHALE 2 PUFFS INTO THE LUNGS EVERY 4 (FOUR) HOURS AS NEEDED FOR WHEEZING OR SHORTNESS OF BREATH. 18 g 5  . [START ON 01/06/2020] ALPRAZolam (XANAX) 1 MG tablet Take 1 tablet (1 mg total) by mouth 2 (two) times daily as needed for anxiety. 60 tablet 2  . amoxicillin-clavulanate (AUGMENTIN) 875-125 MG tablet Take 1 tablet by mouth 2 (two) times daily. 20 tablet 0  . ASHWAGANDHA PO Take by mouth.    Marland Kitchen buPROPion (WELLBUTRIN XL) 150 MG 24 hr  tablet Take 2 tablets (300 mg total) by mouth daily. 180 tablet 0  . fluticasone (FLONASE) 50 MCG/ACT nasal spray SPRAY 2 SPRAYS INTO EACH NOSTRIL EVERY DAY 48 mL 2  . Fluticasone Propionate, Inhal, (FLOVENT DISKUS) 100 MCG/BLIST AEPB INHALE 1 PUFF INTO THE LUNGS DAILY 180 each 1  . gabapentin (NEURONTIN) 400 MG capsule TAKE 1 CAPSULE BY MOUTH 3 TIMES A DAY AND 2 CAPSULES AT BEDTIME 450 capsule 0  . hydrOXYzine (ATARAX/VISTARIL) 10 MG tablet Take 1 tablet (10 mg total) by mouth 3 (three) times daily as needed. 270 tablet 0  . ibuprofen (ADVIL) 800 MG tablet Take 800 mg by mouth every 8 (eight) hours as needed.    . meloxicam (MOBIC) 15 MG tablet Mobic 15 mg tablet   1 tablet every day by oral route.    . methylphenidate 18 MG PO CR tablet Take 2 tablets (36 mg total) by mouth daily. (Patient taking differently: Take 18 mg by mouth daily. ) 60 tablet 0  . methylPREDNISolone (MEDROL DOSEPAK) 4 MG TBPK tablet As directed 21 tablet 0  . montelukast (SINGULAIR) 10 MG tablet TAKE 1 TABLET BY MOUTH EVERYDAY AT BEDTIME 90 tablet 3  . ondansetron (ZOFRAN) 4 MG tablet Take 1 tablet (4 mg total) by mouth every 8 (eight) hours as needed for nausea or vomiting. 20 tablet 0  . ondansetron (ZOFRAN)  4 MG tablet Zofran 4 mg tablet  Take 2 tablets every 8 hours by oral route as needed for 5 days.  Take 1-2 tabs every 8 hours as needed for nausea    . rizatriptan (MAXALT-MLT) 10 MG disintegrating tablet Take 1 tablet (10 mg total) by mouth as needed for migraine. May repeat in 2 hours if needed 30 tablet 3  . traMADol (ULTRAM) 50 MG tablet Take 50 mg by mouth 3 (three) times daily as needed.    . valACYclovir (VALTREX) 500 MG tablet Take 1 tablet (500 mg total) by mouth 2 (two) times daily. 180 tablet 3   No current facility-administered medications for this visit.    Medication Side Effects: None  Allergies: No Known Allergies  Past Medical History:  Diagnosis Date  . Anxiety   . Asthma    seasonal  .  Headache   . Pelvic pain     Family History  Problem Relation Age of Onset  . Hypertension Mother   . Anxiety disorder Mother   . Depression Mother   . Breast cancer Other        GREAT AUNT  . ADD / ADHD Father     Social History   Socioeconomic History  . Marital status: Divorced    Spouse name: Not on file  . Number of children: Not on file  . Years of education: Not on file  . Highest education level: Not on file  Occupational History  . Not on file  Tobacco Use  . Smoking status: Never Smoker  . Smokeless tobacco: Never Used  Substance and Sexual Activity  . Alcohol use: Not Currently    Alcohol/week: 1.0 standard drinks    Types: 1 Glasses of wine per week  . Drug use: No  . Sexual activity: Not Currently  Other Topics Concern  . Not on file  Social History Narrative  . Not on file   Social Determinants of Health   Financial Resource Strain:   . Difficulty of Paying Living Expenses:   Food Insecurity:   . Worried About Programme researcher, broadcasting/film/video in the Last Year:   . Barista in the Last Year:   Transportation Needs:   . Freight forwarder (Medical):   Marland Kitchen Lack of Transportation (Non-Medical):   Physical Activity:   . Days of Exercise per Week:   . Minutes of Exercise per Session:   Stress:   . Feeling of Stress :   Social Connections:   . Frequency of Communication with Friends and Family:   . Frequency of Social Gatherings with Friends and Family:   . Attends Religious Services:   . Active Member of Clubs or Organizations:   . Attends Banker Meetings:   Marland Kitchen Marital Status:   Intimate Partner Violence:   . Fear of Current or Ex-Partner:   . Emotionally Abused:   Marland Kitchen Physically Abused:   . Sexually Abused:     Past Medical History, Surgical history, Social history, and Family history were reviewed and updated as appropriate.   Please see review of systems for further details on the patient's review from today.   Objective:    Physical Exam:  LMP 02/19/2009 (LMP Unknown)   Physical Exam Constitutional:      General: She is not in acute distress. Musculoskeletal:        General: No deformity.  Neurological:     Mental Status: She is alert and oriented to person, place, and  time.     Coordination: Coordination normal.  Psychiatric:        Attention and Perception: Attention and perception normal. She does not perceive auditory or visual hallucinations.        Mood and Affect: Mood is anxious and depressed. Affect is not labile, blunt, angry or inappropriate.        Speech: Speech normal.        Behavior: Behavior normal.        Thought Content: Thought content normal. Thought content is not paranoid or delusional. Thought content does not include homicidal or suicidal ideation. Thought content does not include homicidal or suicidal plan.        Cognition and Memory: Cognition and memory normal.        Judgment: Judgment normal.     Comments: Insight intact     Lab Review:     Component Value Date/Time   NA 140 08/15/2019 1007   K 3.9 08/15/2019 1007   CL 103 08/15/2019 1007   CO2 28 08/15/2019 1007   GLUCOSE 98 08/15/2019 1007   BUN 12 08/15/2019 1007   CREATININE 1.05 08/15/2019 1007   CREATININE 0.83 03/13/2017 1211   CALCIUM 9.7 08/15/2019 1007   PROT 7.1 03/15/2018 0846   ALBUMIN 4.3 03/15/2018 0846   AST 13 03/15/2018 0846   ALT 9 03/15/2018 0846   ALKPHOS 97 03/15/2018 0846   BILITOT 0.3 03/15/2018 0846   GFRNONAA >60 01/30/2019 1424   GFRAA >60 01/30/2019 1424       Component Value Date/Time   WBC 12.6 (H) 01/30/2019 1424   RBC 4.06 01/30/2019 1424   HGB 12.2 01/30/2019 1424   HCT 36.8 01/30/2019 1424   PLT 229 01/30/2019 1424   MCV 90.6 01/30/2019 1424   MCH 30.0 01/30/2019 1424   MCHC 33.2 01/30/2019 1424   RDW 13.2 01/30/2019 1424   LYMPHSABS 1.3 03/15/2018 0846   MONOABS 0.5 03/15/2018 0846   EOSABS 0.1 03/15/2018 0846   BASOSABS 0.0 03/15/2018 0846    No results  found for: POCLITH, LITHIUM   No results found for: PHENYTOIN, PHENOBARB, VALPROATE, CBMZ   .res Assessment: Plan:   Discussed option of starting Concerta 27 mg po q a or continuing Concerta 18 mg po q am. She reports that she would prefer to continue 18 mg at this time considering the recent loss of her dog and other psychosocial stressors, since it may be difficult to evaluate response in the context of grief and stress and she would prefer to avoid any potential adverse effects with increased dose at this time. Will re-evaluate possible increase in Concerta at next visit. Continue all other medications as prescribed. Recommend continuing therapy with Elio Forget, [facility]. Pt to f/u in 3-4 weeks or sooner if clinically indicated.  Patient advised to contact office with any questions, adverse effects, or acute worsening in signs and symptoms.  Diagnoses and all orders for this visit:  Attention deficit hyperactivity disorder (ADHD), combined type  Insomnia, unspecified type  Major depressive disorder, recurrent episode, moderate (HCC)     Please see After Visit Summary for patient specific instructions.  Future Appointments  Date Time Provider Department Center  01/15/2020  9:00 AM Corie Chiquito, PMHNP [facility] None  01/15/2020 10:00 AM Waldron Session, [facility] None  01/29/2020  3:00 PM Waldron Session, [facility] CP-CP None  02/03/2020 11:00 AM Waldron Session, [facility] None  02/10/2020 10:00 AM Waldron Session, [facility] CP-CP None    No orders of  the defined types were placed in this encounter.   -------------------------------

## 2020-01-01 NOTE — Progress Notes (Signed)
         [facility] Counselor/Therapist Progress Note   Patient ID: [name], MRN: [id number]  Date: 01/01/2020  Timespent: 45 minutes  Treatment Type: Individual therapy  Mental Status Exam:   Appearance:   Casual     Behavior:  Appropriate  Motor:  Normal  Speech/Language:   Normal Rate, rhythm   Affect:  Full Range  Mood:  anxious, depressed, pleasant   Thought process:  Coherent and Relevant  Thought content:    Logical, goal directed  Perceptual disturbances:    none  Orientation:  Full (Time, Place, and Person)  Attention:  Good  Concentration:  good  Memory:  intact  Fund of knowledge:   Good  Insight:    Good  Judgment:   Good  Impulse Control:  good    Reported Symptoms:  Depressed mood, anxiety most days, rumination, fatigue, anhedonia, some sleep disturbances  Risk Assessment: Danger to Self:  No Self-injurious Behavior: No Danger to Others: No Duty to Warn:no Physical Aggression / Violence:No  Access to Firearms a concern: No  Gang Involvement:No   Patient / guardian was educated about steps to take if suicide or homicide risk level increases between visits. While future psychiatric events cannot be accurately predicted, the patient does not currently require acute inpatient psychiatric care and does not currently meet [facility] involuntary commitment criteria.  Subjective:  Patient presents for session on time in no distress.  She shared the continued process for caring for her father who has had significant medical issues over the past 1 to 2 months.  Some stress has been relieved due to closure of a lawsuit with which she was involved.  This is helped relieve some emotional distress which was in part related to financial stress that has been ongoing for her for some time.  She also processed a recent painful event where her dog passed away.  Time was spent to allow her to fully process this loss and subsequent feelings.  Family  relationships were explored, feelings process.  She continues to have some relational strain with her mother which has been ongoing, but she has been able to set clear and effective boundaries.  She is appearing more hopeful about her life, and her current situation due to some of these recent events and continues to consider future plans related to employment and possibly reengaging more in her social life as this was identified as a need.    Interventions: CBT, supportive therapy  Diagnosis:   ICD-10-CM   1. Anxiety  F41.9   2. Attention deficit hyperactivity disorder (ADHD), combined type  F90.2      Plan:  Patient is to apply thought blocking skill to decrease her anxiety about her upcoming medical appointment for her nerve block.  Patient to continue to advocate for her needs related to medical and in relationships.  Long-term goal:   Identify the major life complexities from the past and present that form the basis for present feelings of anxiety and depression. Short-term goal:  Enhance ability to handle effectively maintain self care while coping with pain associated with medical conditions. Patient will improve her        Positive, realistic and idealistic self talk to keep feelings of anxiety and depression more infrequent.   Assessment of progress:  progressing   Waldron Session, [facility]

## 2020-01-07 ENCOUNTER — Ambulatory Visit (INDEPENDENT_AMBULATORY_CARE_PROVIDER_SITE_OTHER): Payer: 59 | Admitting: Family Medicine

## 2020-01-07 ENCOUNTER — Encounter: Payer: Self-pay | Admitting: Family Medicine

## 2020-01-07 ENCOUNTER — Other Ambulatory Visit: Payer: Self-pay

## 2020-01-07 VITALS — BP 120/62 | HR 87 | Temp 98.7°F | Wt 119.0 lb

## 2020-01-07 DIAGNOSIS — B009 Herpesviral infection, unspecified: Secondary | ICD-10-CM | POA: Diagnosis not present

## 2020-01-07 DIAGNOSIS — G43809 Other migraine, not intractable, without status migrainosus: Secondary | ICD-10-CM

## 2020-01-07 DIAGNOSIS — G43909 Migraine, unspecified, not intractable, without status migrainosus: Secondary | ICD-10-CM | POA: Diagnosis not present

## 2020-01-07 MED ORDER — VALACYCLOVIR HCL 500 MG PO TABS: 500.0000 mg | ORAL_TABLET | Freq: Two times a day (BID) | ORAL | 3 refills | Status: DC

## 2020-01-07 MED ORDER — RIZATRIPTAN BENZOATE 10 MG PO TBDP: 10.0000 mg | ORAL_TABLET | ORAL | 3 refills | Status: DC | PRN

## 2020-01-07 MED ORDER — IBUPROFEN 800 MG PO TABS: 800.0000 mg | ORAL_TABLET | Freq: Three times a day (TID) | ORAL | 5 refills | Status: DC | PRN

## 2020-01-07 NOTE — Progress Notes (Signed)
   Subjective:    Patient ID: [name], female    DOB: 04-03-1966, 54 y.o.   MRN: [id number]  HPI Here for refills. She has been doing well. 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 Her migraines and allergies have been stable. Her back pain is better but bothers her some days.    Review of Systems  Constitutional: Negative.   Respiratory: Negative.   Cardiovascular: Negative.   Musculoskeletal: Positive for back pain.  Neurological: Positive for headaches.       Objective:   Physical Exam Constitutional:      Appearance: Normal appearance.  Cardiovascular:     Rate and Rhythm: Normal rate and regular rhythm.     Pulses: Normal pulses.     Heart sounds: Normal heart sounds.  Pulmonary:     Effort: Pulmonary effort is normal.     Breath sounds: Normal breath sounds.  Neurological:     Mental Status: She is alert.           Assessment & Plan:  Her back pain and migraines and herpes simplex are stable. Meds were refilled.  Gershon Crane, MD

## 2020-01-15 ENCOUNTER — Ambulatory Visit: Payer: 59 | Admitting: Psychiatry

## 2020-01-15 ENCOUNTER — Ambulatory Visit: Payer: 59 | Admitting: Mental Health

## 2020-01-16 ENCOUNTER — Telehealth (INDEPENDENT_AMBULATORY_CARE_PROVIDER_SITE_OTHER): Payer: 59 | Admitting: Family Medicine

## 2020-01-16 DIAGNOSIS — J4520 Mild intermittent asthma, uncomplicated: Secondary | ICD-10-CM | POA: Diagnosis not present

## 2020-01-16 DIAGNOSIS — J320 Chronic maxillary sinusitis: Secondary | ICD-10-CM

## 2020-01-16 DIAGNOSIS — J309 Allergic rhinitis, unspecified: Secondary | ICD-10-CM | POA: Diagnosis not present

## 2020-01-16 DIAGNOSIS — H6982 Other specified disorders of Eustachian tube, left ear: Secondary | ICD-10-CM

## 2020-01-16 MED ORDER — AMOXICILLIN-POT CLAVULANATE 875-125 MG PO TABS: 1.0000 | ORAL_TABLET | Freq: Two times a day (BID) | ORAL | 0 refills | Status: DC

## 2020-01-16 NOTE — Progress Notes (Signed)
Virtual Visit via Video Note  I connected with [name]  on 01/16/20 at  3:40 PM EDT by a video enabled telemedicine application and verified that I am speaking with the correct person using two identifiers.  Location patient: home, [place] Location provider:work or home office Persons participating in the virtual visit: patient, provider  I discussed the limitations of evaluation and management by telemedicine and the availability of in person appointments. The patient expressed understanding and agreed to proceed.   HPI:  Sinus congestion and Headache: -started about 7-10 days ago -symptoms include pnd, sinus pressure, upper tooth pain maxillary L, L ear full -she sees dentist tomorrow about the tooth pain, but wonders if is sinus related -has L maxillary sinus pain worse with leaning forward -she has a history asthma, no daily inhalers -she is on singulair daily -no fevers, malaise, SOB -fully vaccinated for COVID19 for > 1 month -no known sick contacts and has not been around folks in 2 weeks prior   ROS: See pertinent positives and negatives per HPI.  Past Medical History:  Diagnosis Date  . Anxiety   . Asthma    seasonal  . Headache   . Pelvic pain     Past Surgical History:  Procedure Laterality Date  . ABDOMINAL EXPOSURE N/A 01/30/2019   Procedure: ABDOMINAL EXPOSURE;  Surgeon: Nada Libman, MD;  Location: [facility] OR;  Service: Vascular;  Laterality: N/A;  . ANTERIOR LUMBAR FUSION N/A 01/30/2019   Procedure: Anterior Lumbar Interbody Fusion L5-S1;  Surgeon: Venita Lick, MD;  Location: [facility] OR;  Service: Orthopedics;  Laterality: N/A;   . CERVICAL BIOPSY  W/ LOOP ELECTRODE EXCISION     NOV 2014  . CHOLECYSTECTOMY    . LAPAROSCOPIC SALPINGO OOPHERECTOMY Bilateral 03/16/2017   Procedure: LAPAROSCOPIC SALPINGO OOPHORECTOMY;  Surgeon: Dara Lords, MD;  Location: [facility];  Service: Gynecology;  Laterality: Bilateral;  . OPEN REDUCTION INTERNAL  FIXATION (ORIF) DISTAL RADIAL FRACTURE Right 05/02/2018   Procedure: OPEN REDUCTION INTERNAL FIXATION (ORIF) DISTAL RADIAL FRACTURE;  Surgeon: Bradly Bienenstock, MD;  Location: [facility] OR;  Service: Orthopedics;  Laterality: Right;  . PELVIC LAPAROSCOPY      Family History  Problem Relation Age of Onset  . Hypertension Mother   . Anxiety disorder Mother   . Depression Mother   . Breast cancer Other        GREAT AUNT  . ADD / ADHD Father     SOCIAL HX: see hpi   Current Outpatient Medications:  .  albuterol (VENTOLIN HFA) 108 (90 Base) MCG/ACT inhaler, INHALE 2 PUFFS INTO THE LUNGS EVERY 4 (FOUR) HOURS AS NEEDED FOR WHEEZING OR SHORTNESS OF BREATH., Disp: 18 g, Rfl: 5 .  ALPRAZolam (XANAX) 1 MG tablet, Take 1 tablet (1 mg total) by mouth 2 (two) times daily as needed for anxiety., Disp: 60 tablet, Rfl: 2 .  amoxicillin-clavulanate (AUGMENTIN) 875-125 MG tablet, Take 1 tablet by mouth 2 (two) times daily., Disp: 20 tablet, Rfl: 0 .  ASHWAGANDHA PO, Take by mouth., Disp: , Rfl:  .  buPROPion (WELLBUTRIN XL) 150 MG 24 hr tablet, Take 2 tablets (300 mg total) by mouth daily., Disp: 180 tablet, Rfl: 0 .  fluticasone (FLONASE) 50 MCG/ACT nasal spray, SPRAY 2 SPRAYS INTO EACH NOSTRIL EVERY DAY, Disp: 48 mL, Rfl: 2 .  Fluticasone Propionate, Inhal, (FLOVENT DISKUS) 100 MCG/BLIST AEPB, INHALE 1 PUFF INTO THE LUNGS DAILY, Disp: 180 each, Rfl: 1 .  gabapentin (NEURONTIN) 400 MG capsule, TAKE  1 CAPSULE BY MOUTH 3 TIMES A DAY AND 2 CAPSULES AT BEDTIME, Disp: 450 capsule, Rfl: 0 .  hydrOXYzine (ATARAX/VISTARIL) 10 MG tablet, Take 1 tablet (10 mg total) by mouth 3 (three) times daily as needed., Disp: 270 tablet, Rfl: 0 .  ibuprofen (ADVIL) 800 MG tablet, Take 1 tablet (800 mg total) by mouth every 8 (eight) hours as needed for moderate pain., Disp: 60 tablet, Rfl: 5 .  meloxicam (MOBIC) 15 MG tablet, Mobic 15 mg tablet   1 tablet every day by oral route., Disp: , Rfl:  .  methylphenidate 18 MG PO CR tablet, Take  2 tablets (36 mg total) by mouth daily. (Patient taking differently: Take 18 mg by mouth daily. ), Disp: 60 tablet, Rfl: 0 .  montelukast (SINGULAIR) 10 MG tablet, TAKE 1 TABLET BY MOUTH EVERYDAY AT BEDTIME, Disp: 90 tablet, Rfl: 3 .  ondansetron (ZOFRAN) 4 MG tablet, Take 1 tablet (4 mg total) by mouth every 8 (eight) hours as needed for nausea or vomiting., Disp: 20 tablet, Rfl: 0 .  ondansetron (ZOFRAN) 4 MG tablet, Zofran 4 mg tablet  Take 2 tablets every 8 hours by oral route as needed for 5 days.  Take 1-2 tabs every 8 hours as needed for nausea, Disp: , Rfl:  .  rizatriptan (MAXALT-MLT) 10 MG disintegrating tablet, Take 1 tablet (10 mg total) by mouth as needed for migraine. May repeat in 2 hours if needed, Disp: 30 tablet, Rfl: 3 .  traMADol (ULTRAM) 50 MG tablet, Take 50 mg by mouth 3 (three) times daily as needed., Disp: , Rfl:  .  valACYclovir (VALTREX) 500 MG tablet, Take 1 tablet (500 mg total) by mouth 2 (two) times daily., Disp: 180 tablet, Rfl: 3  EXAM:  VITALS per patient if applicable:  GENERAL: alert, oriented, appears well and in no acute distress  HEENT: atraumatic, conjunttiva clear, no obvious abnormalities on inspection of external nose and ears, reports max sinus TTP on self exam, reports sinus pressure worse with leaning forward.  NECK: normal movements of the head and neck  LUNGS: on inspection no signs of respiratory distress, breathing rate appears normal, no obvious gross SOB, gasping or wheezing  CV: no obvious cyanosis  MS: moves all visible extremities without noticeable abnormality  PSYCH/NEURO: pleasant and cooperative, no obvious depression or anxiety, speech and thought processing grossly intact  ASSESSMENT AND PLAN:  Discussed the following assessment and plan:  Allergic rhinitis, unspecified seasonality, unspecified trigger  Intermittent asthma without complication, unspecified asthma severity  Maxillary sinusitis, unspecified  chronicity  Dysfunction of left eustachian tube  -we discussed possible serious and likely etiologies, options for evaluation and workup, limitations of telemedicine visit vs in person visit, treatment, treatment risks and precautions. Pt prefers to treat via telemedicine empirically rather then risking or undertaking an in person visit at this moment. Query sinusitis likely 2ndary to allergic rhinitis vs VURI. COVID19 less likely given vaccinated. Has asthma, seem to be controlled. Reports she uses alb as needed. Opted to treat with Augmentin 875 bid x 10 days, continue allergy regimen, nasal saline, nasal steroid for possible ETD. Monitor asthma and prn alb. Stay home when sick. Follow up or seek in person care if worsening, new symptoms arise, or if is not improving with treatment.   I discussed the assessment and treatment plan with the patient. The patient was provided an opportunity to ask questions and all were answered. The patient agreed with the plan and demonstrated an understanding of the  instructions.   The patient was advised to call back or seek an in-person evaluation if the symptoms worsen or if the condition fails to improve as anticipated.   Terressa Koyanagi, DO

## 2020-01-22 ENCOUNTER — Telehealth: Payer: Self-pay | Admitting: Family Medicine

## 2020-01-22 ENCOUNTER — Encounter: Payer: Self-pay | Admitting: Family Medicine

## 2020-01-22 ENCOUNTER — Ambulatory Visit: Payer: Self-pay | Admitting: Family Medicine

## 2020-01-22 ENCOUNTER — Telehealth: Payer: 59 | Admitting: Family Medicine

## 2020-01-22 ENCOUNTER — Telehealth (INDEPENDENT_AMBULATORY_CARE_PROVIDER_SITE_OTHER): Payer: Self-pay | Admitting: Family Medicine

## 2020-01-22 DIAGNOSIS — J40 Bronchitis, not specified as acute or chronic: Secondary | ICD-10-CM

## 2020-01-22 MED ORDER — METHYLPREDNISOLONE 4 MG PO TBPK: ORAL_TABLET | ORAL | 0 refills | Status: DC

## 2020-01-22 NOTE — Progress Notes (Signed)
   Subjective:    Patient ID: [name], female    DOB: [birth year], 54 y.o.   MRN: [id number]  HPI Virtual Visit via Telephone Note  I connected with the patient on 01/22/20 at  1:00 PM EDT by telephone and verified that I am speaking with the correct person using two identifiers.   I discussed the limitations, risks, security and privacy concerns of performing an evaluation and management service by telephone and the availability of in person appointments. I also discussed with the patient that there may be a patient responsible charge related to this service. The patient expressed understanding and agreed to proceed.  Location patient: home Location provider: work or home office Participants present for the call: patient, provider Patient did not have a visit in the prior 7 days to address this/these issue(s).   History of Present Illness: Here for 2 weeks of sinus congestion, PND, left ear pain, and coughing. She had a virtual visit on 01-16-20 and she was started on Augmentin. Now in the past few days she feels the infection has moved to her chest. She is coughing up yellow sputum. No SOB or chest pain or fever. Using Mucinex and her albuterol inhaler.    Observations/Objective: Patient sounds cheerful and well on the phone. I do not appreciate any SOB. Speech and thought processing are grossly intact. Patient reported vitals:  Assessment and Plan: Bronchitis. We will add a Medrol dose pack. Recheck prn.  Gershon Crane, MD   Follow Up Instructions:     [phone] [phone] 21-30 I did not refer this patient for an OV in the next 24 hours for this/these issue(s).  I discussed the assessment and treatment plan with the patient. The patient was provided an opportunity to ask questions and all were answered. The patient agreed with the plan and demonstrated an understanding of the instructions.   The patient was advised to call back or seek an in-person  evaluation if the symptoms worsen or if the condition fails to improve as anticipated.  I provided 12 minutes of non-face-to-face time during this encounter.   Gershon Crane, MD    Review of Systems     Objective:   Physical Exam        Assessment & Plan:

## 2020-01-26 ENCOUNTER — Other Ambulatory Visit: Payer: Self-pay | Admitting: Family Medicine

## 2020-01-26 ENCOUNTER — Encounter: Payer: Self-pay | Admitting: Family Medicine

## 2020-01-27 ENCOUNTER — Telehealth (INDEPENDENT_AMBULATORY_CARE_PROVIDER_SITE_OTHER): Payer: Self-pay | Admitting: Psychiatry

## 2020-01-27 ENCOUNTER — Encounter: Payer: Self-pay | Admitting: Psychiatry

## 2020-01-27 DIAGNOSIS — F419 Anxiety disorder, unspecified: Secondary | ICD-10-CM

## 2020-01-27 DIAGNOSIS — F331 Major depressive disorder, recurrent, moderate: Secondary | ICD-10-CM

## 2020-01-27 DIAGNOSIS — G4700 Insomnia, unspecified: Secondary | ICD-10-CM

## 2020-01-27 DIAGNOSIS — F902 Attention-deficit hyperactivity disorder, combined type: Secondary | ICD-10-CM

## 2020-01-27 MED ORDER — GABAPENTIN 400 MG PO CAPS: ORAL_CAPSULE | ORAL | 0 refills | Status: DC

## 2020-01-27 MED ORDER — HYDROXYZINE HCL 10 MG PO TABS: 10.0000 mg | ORAL_TABLET | Freq: Three times a day (TID) | ORAL | 0 refills | Status: DC | PRN

## 2020-01-27 MED ORDER — BUPROPION HCL ER (XL) 150 MG PO TB24: 300.0000 mg | ORAL_TABLET | Freq: Every day | ORAL | 0 refills | Status: DC

## 2020-01-27 MED ORDER — METHYLPHENIDATE HCL ER 27 MG PO TB24: 27.0000 mg | ORAL_TABLET | Freq: Every day | ORAL | 0 refills | Status: DC

## 2020-01-27 NOTE — Progress Notes (Signed)
[name] [id number] Apr 28, 1966 54 y.o.  Virtual Visit via Telephone Note  I connected with pt on 01/27/20 at 11:00 AM EDT by telephone and verified that I am speaking with the correct person using two identifiers.   I discussed the limitations, risks, security and privacy concerns of performing an evaluation and management service by telephone and the availability of in person appointments. I also discussed with the patient that there may be a patient responsible charge related to this service. The patient expressed understanding and agreed to proceed.   I discussed the assessment and treatment plan with the patient. The patient was provided an opportunity to ask questions and all were answered. The patient agreed with the plan and demonstrated an understanding of the instructions.   The patient was advised to call back or seek an in-person evaluation if the symptoms worsen or if the condition fails to improve as anticipated.  I provided 30 minutes of non-face-to-face time during this encounter.  The patient was located at home.  The provider was located at [facility].   [name], PMHNP   Subjective:   Patient ID:  [name] is a 54 y.o. (DOB 05/13/1966) female.  Chief Complaint:  Chief Complaint  Patient presents with  . ADD  . Follow-up    h/o Depression and anxiety    HPI [name] presents for follow-up of ADD, depression, and anxiety. She reports that she would like to try to increase Concerta to 27 mg. She has noticed a partial improvement with Concerta 18 mg po qd. She notices that she is getting distracted or not completing tasks and is having to frequently re-direct herself. She also notices a short duration with 18 mg dose. She has adopted a consistent sleep schedule and this has been helpful for her sleep. Xanax is helpful for her sleep quality. She reports, "everything else is status quo."  She reports that she continues to  have some depression and that mood has improved some and is laughing more. She notices mood is lower if she forgets to take Gabapentin. She reports taking hydroxyzine prn anxiety on a daily basis. She reports that her anxiety has been manageable with medication. Appetite has been good and is now at her usual weight. Energy and motivation have been "fair." Denies SI.  She notices a shift when Concerta wears off around 3-7 pm.  She is continuing to search for a job.   Past Psychiatric Medication Trials: Gabapentin- prescribed for nerve pain. Needs 600 mg to help with insomnia. Has been helpful for anxiety and pain.  Cymbalta- Gained 20 lbs in 3 months. Had severe discontinuation s/s.  Zoloft- Felt groggy and "spacey." Sexual side effects.  Prozac- felt groggy and spacey. "All I wanted to do was cry." Wellbutrin XL- Has been helpful for depression and has taken it periodically. Took 150 mg po qd. Last took around 2018.  Wellbutrin SR- Felt that 100 mg was ineffective. Alprazolam- Taken prn for 6 years. Diazepam Trazodone- jitteriness. Paradoxical effect. Hydroxyzine- effective for anxiety  Review of Systems:  Review of Systems  Constitutional: Negative for fever.  HENT: Positive for congestion, sinus pressure, sinus pain and tinnitus.   Respiratory: Positive for cough.   Cardiovascular: Negative for palpitations.  Musculoskeletal: Negative for arthralgias and gait problem.  Neurological: Negative for tremors.  Psychiatric/Behavioral:       Please refer to HPI    Medications: I have reviewed the patient's current medications.  Current Outpatient Medications  Medication Sig Dispense Refill  . albuterol (VENTOLIN HFA) 108 (90 Base) MCG/ACT inhaler INHALE 2 PUFFS INTO THE LUNGS EVERY 4 (FOUR) HOURS AS NEEDED FOR WHEEZING OR SHORTNESS OF BREATH. 18 g 5  . ALPRAZolam (XANAX) 1 MG tablet Take 1 tablet (1 mg total) by mouth 2 (two) times daily as needed for anxiety. 60 tablet 2  .  ASHWAGANDHA PO Take by mouth.    Marland Kitchen buPROPion (WELLBUTRIN XL) 150 MG 24 hr tablet Take 2 tablets (300 mg total) by mouth daily. 180 tablet 0  . fluticasone (FLONASE) 50 MCG/ACT nasal spray SPRAY 2 SPRAYS INTO EACH NOSTRIL EVERY DAY 48 mL 2  . Fluticasone Propionate, Inhal, (FLOVENT DISKUS) 100 MCG/BLIST AEPB INHALE 1 PUFF INTO THE LUNGS DAILY 180 each 1  . gabapentin (NEURONTIN) 400 MG capsule TAKE 1 CAPSULE BY MOUTH 3 TIMES A DAY AND 2 CAPSULES AT BEDTIME 450 capsule 0  . hydrOXYzine (ATARAX/VISTARIL) 10 MG tablet Take 1 tablet (10 mg total) by mouth 3 (three) times daily as needed. 270 tablet 0  . ibuprofen (ADVIL) 800 MG tablet Take 1 tablet (800 mg total) by mouth every 8 (eight) hours as needed for moderate pain. 60 tablet 5  . meloxicam (MOBIC) 15 MG tablet Mobic 15 mg tablet   1 tablet every day by oral route.    . methylphenidate 27 MG PO TB24 Take 1 tablet (27 mg total) by mouth daily. 30 tablet 0  . methylPREDNISolone (MEDROL DOSEPAK) 4 MG TBPK tablet As directed 21 tablet 0  . montelukast (SINGULAIR) 10 MG tablet TAKE 1 TABLET BY MOUTH EVERYDAY AT BEDTIME 90 tablet 3  . ondansetron (ZOFRAN) 4 MG tablet Take 1 tablet (4 mg total) by mouth every 8 (eight) hours as needed for nausea or vomiting. 20 tablet 0  . rizatriptan (MAXALT-MLT) 10 MG disintegrating tablet Take 1 tablet (10 mg total) by mouth as needed for migraine. May repeat in 2 hours if needed 30 tablet 3  . valACYclovir (VALTREX) 500 MG tablet Take 1 tablet (500 mg total) by mouth 2 (two) times daily. 180 tablet 3  . amoxicillin-clavulanate (AUGMENTIN) 875-125 MG tablet Take 1 tablet by mouth 2 (two) times daily. 20 tablet 0  . HYDROcodone-homatropine (HYDROMET) 5-1.5 MG/5ML syrup Take 5 mLs by mouth every 4 (four) hours as needed. 240 mL 0  . ondansetron (ZOFRAN) 4 MG tablet Zofran 4 mg tablet  Take 2 tablets every 8 hours by oral route as needed for 5 days.  Take 1-2 tabs every 8 hours as needed for nausea    . traMADol  (ULTRAM) 50 MG tablet Take 50 mg by mouth 3 (three) times daily as needed.     No current facility-administered medications for this visit.    Medication Side Effects: None  Allergies: No Known Allergies  Past Medical History:  Diagnosis Date  . Anxiety   . Asthma    seasonal  . Headache   . Pelvic pain     Family History  Problem Relation Age of Onset  . Hypertension Mother   . Anxiety disorder Mother   . Depression Mother   . Breast cancer Other        GREAT AUNT  . ADD / ADHD Father     Social History   Socioeconomic History  . Marital status: Divorced    Spouse name: Not on file  . Number of children: Not on file  . Years of education: Not on file  . Highest education level: Not  on file  Occupational History  . Not on file  Tobacco Use  . Smoking status: Never Smoker  . Smokeless tobacco: Never Used  Substance and Sexual Activity  . Alcohol use: Not Currently    Alcohol/week: 1.0 standard drinks    Types: 1 Glasses of wine per week  . Drug use: No  . Sexual activity: Not Currently  Other Topics Concern  . Not on file  Social History Narrative  . Not on file   Social Determinants of Health   Financial Resource Strain:   . Difficulty of Paying Living Expenses:   Food Insecurity:   . Worried About Programme researcher, broadcasting/film/video in the Last Year:   . Barista in the Last Year:   Transportation Needs:   . Freight forwarder (Medical):   Marland Kitchen Lack of Transportation (Non-Medical):   Physical Activity:   . Days of Exercise per Week:   . Minutes of Exercise per Session:   Stress:   . Feeling of Stress :   Social Connections:   . Frequency of Communication with Friends and Family:   . Frequency of Social Gatherings with Friends and Family:   . Attends Religious Services:   . Active Member of Clubs or Organizations:   . Attends Banker Meetings:   Marland Kitchen Marital Status:   Intimate Partner Violence:   . Fear of Current or Ex-Partner:   .  Emotionally Abused:   Marland Kitchen Physically Abused:   . Sexually Abused:     Past Medical History, Surgical history, Social history, and Family history were reviewed and updated as appropriate.   Please see review of systems for further details on the patient's review from today.   Objective:   Physical Exam:  Pulse 78   LMP 02/19/2009 (LMP Unknown)   Physical Exam Neurological:     Mental Status: She is alert and oriented to person, place, and time.     Cranial Nerves: No dysarthria.  Psychiatric:        Attention and Perception: Attention and perception normal.        Mood and Affect: Mood normal.        Speech: Speech normal.        Behavior: Behavior is cooperative.        Thought Content: Thought content normal. Thought content is not paranoid or delusional. Thought content does not include homicidal or suicidal ideation. Thought content does not include homicidal or suicidal plan.        Cognition and Memory: Cognition and memory normal.        Judgment: Judgment normal.     Comments: Insight intact     Lab Review:     Component Value Date/Time   NA 140 08/15/2019 1007   K 3.9 08/15/2019 1007   CL 103 08/15/2019 1007   CO2 28 08/15/2019 1007   GLUCOSE 98 08/15/2019 1007   BUN 12 08/15/2019 1007   CREATININE 1.05 08/15/2019 1007   CREATININE 0.83 03/13/2017 1211   CALCIUM 9.7 08/15/2019 1007   PROT 7.1 03/15/2018 0846   ALBUMIN 4.3 03/15/2018 0846   AST 13 03/15/2018 0846   ALT 9 03/15/2018 0846   ALKPHOS 97 03/15/2018 0846   BILITOT 0.3 03/15/2018 0846   GFRNONAA >60 01/30/2019 1424   GFRAA >60 01/30/2019 1424       Component Value Date/Time   WBC 12.6 (H) 01/30/2019 1424   RBC 4.06 01/30/2019 1424   HGB 12.2 01/30/2019 1424  HCT 36.8 01/30/2019 1424   PLT 229 01/30/2019 1424   MCV 90.6 01/30/2019 1424   MCH 30.0 01/30/2019 1424   MCHC 33.2 01/30/2019 1424   RDW 13.2 01/30/2019 1424   LYMPHSABS 1.3 03/15/2018 0846   MONOABS 0.5 03/15/2018 0846   EOSABS  0.1 03/15/2018 0846   BASOSABS 0.0 03/15/2018 0846    No results found for: POCLITH, LITHIUM   No results found for: PHENYTOIN, PHENOBARB, VALPROATE, CBMZ   .res Assessment: Plan:   Will increase Concerta to 27 mg daily since patient reports that 18 mg dose has been well-tolerated and describes only partial improvement with 18 mg dose. Continue Wellbutrin XL 300 mg daily for depression. Continue gabapentin 400 mg 3 times daily and 800 mg at bedtime for anxiety and insomnia. Continue hydroxyzine as needed for anxiety. Recommend continuing psychotherapy with Lanetta Inch, [facility]. Patient to follow-up with this provider in 4 weeks or sooner if clinically indicated. Patient advised to contact office with any questions, adverse effects, or acute worsening in signs and symptoms.  Jasnoor was seen today for add and follow-up.  Diagnoses and all orders for this visit:  Attention deficit hyperactivity disorder (ADHD), combined type -     methylphenidate 27 MG PO TB24; Take 1 tablet (27 mg total) by mouth daily.  Moderate recurrent major depression (HCC) -     buPROPion (WELLBUTRIN XL) 150 MG 24 hr tablet; Take 2 tablets (300 mg total) by mouth daily.  Anxiety -     gabapentin (NEURONTIN) 400 MG capsule; TAKE 1 CAPSULE BY MOUTH 3 TIMES A DAY AND 2 CAPSULES AT BEDTIME -     hydrOXYzine (ATARAX/VISTARIL) 10 MG tablet; Take 1 tablet (10 mg total) by mouth 3 (three) times daily as needed.  Insomnia, unspecified type -     gabapentin (NEURONTIN) 400 MG capsule; TAKE 1 CAPSULE BY MOUTH 3 TIMES A DAY AND 2 CAPSULES AT BEDTIME    Please see After Visit Summary for patient specific instructions.  Future Appointments  Date Time Provider [place]  01/29/2020  3:00 PM Anson [place], [facility] CP[facility] None  02/03/2020 11:00 AM Anson [place], [facility] CP-CP None  02/10/2020 10:00 AM [place]son [place], [facility] None  02/27/2020  9:00 AM Laurey Morale, MD [facility]    No  orders of the defined types were placed in this encounter.     -------------------------------

## 2020-01-28 MED ORDER — AMOXICILLIN-POT CLAVULANATE 875-125 MG PO TABS: 1.0000 | ORAL_TABLET | Freq: Two times a day (BID) | ORAL | 0 refills | Status: DC

## 2020-01-28 MED ORDER — HYDROCODONE-HOMATROPINE 5-1.5 MG/5ML PO SYRP: 5.0000 mL | ORAL_SOLUTION | ORAL | 0 refills | Status: DC | PRN

## 2020-01-28 NOTE — Telephone Encounter (Signed)
These were sent in  

## 2020-01-29 ENCOUNTER — Ambulatory Visit (INDEPENDENT_AMBULATORY_CARE_PROVIDER_SITE_OTHER): Payer: Self-pay | Admitting: Mental Health

## 2020-01-29 DIAGNOSIS — F902 Attention-deficit hyperactivity disorder, combined type: Secondary | ICD-10-CM

## 2020-01-29 DIAGNOSIS — F331 Major depressive disorder, recurrent, moderate: Secondary | ICD-10-CM

## 2020-01-29 NOTE — Telephone Encounter (Signed)
[name], buda are scheduled for a virtual visit with your provider today.    Just as we do with appointments in the office, we must obtain your consent to participate.  Your consent will be active for this visit and any virtual visit you may have with one of our providers in the next 365 days.    If you have a MyChart account, I can also send a copy of this consent to you electronically.  All virtual visits are billed to your insurance company just like a traditional visit in the office.  As this is a virtual visit, video technology does not allow for your provider to perform a traditional examination.  This may limit your provider's ability to fully assess your condition.  If your provider identifies any concerns that need to be evaluated in person or the need to arrange testing such as labs, EKG, etc, we will make arrangements to do so.    Although advances in technology are sophisticated, we cannot ensure that it will always work on either your end or our end.  If the connection with a video visit is poor, we may have to switch to a telephone visit.  With either a video or telephone visit, we are not always able to ensure that we have a secure connection.   I need to obtain your verbal consent now.   Are you willing to proceed with your visit today?   [name] has provided verbal consent on 01/29/2020 for a virtual visit (video or telephone).   Waldron Session, [facility] 01/29/2020  3:08 PM

## 2020-01-29 NOTE — Progress Notes (Signed)
         [facility] Counselor/Therapist Progress Note   Patient ID: [name], MRN: [id number]  Date: 01/29/2020  Timespent: 45 minutes  Treatment Type: Individual therapy  Mental Status Exam:   Appearance:   Casual     Behavior:  Appropriate  Motor:  Normal  Speech/Language:   Normal Rate, rhythm   Affect:  Full Range  Mood:  anxious, depressed, pleasant   Thought process:  Coherent and Relevant  Thought content:    Logical, goal directed  Perceptual disturbances:    none  Orientation:  Full (Time, Place, and Person)  Attention:  Good  Concentration:  good  Memory:  intact  Fund of knowledge:   Good  Insight:    Good  Judgment:   Good  Impulse Control:  good    Reported Symptoms:  Depressed mood, anxiety most days, rumination, fatigue, anhedonia, some sleep disturbances  Risk Assessment: Danger to Self:  No Self-injurious Behavior: No Danger to Others: No Duty to Warn:no Physical Aggression / Violence:No  Access to Firearms a concern: No  Gang Involvement:No   Patient / guardian was educated about steps to take if suicide or homicide risk level increases between visits. While future psychiatric events cannot be accurately predicted, the patient does not currently require acute inpatient psychiatric care and does not currently meet [facility] involuntary commitment criteria.  Subjective:  Patient engaged in telehealth session. She stated she lost her [employer] due to complications w/ her past employer regarding timelines. After making many calls, she now has her insurance reinstated. She continues to take her medications as rx'd, in part due to her diligence to keep her insurance active. 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 Her father had another recent fall but she stated he is adjusting well to being in assisted living currently. She is coping w/ a sinus infection which is making her tired. She continues to have some anxiety due to trying to find a  job. Through guided discovery, she identified needing to give herself recognition of her attempts to find employment and continuing to adhere to making the attempts.   Interventions: CBT, supportive therapy  Diagnosis:   ICD-10-CM   1. Attention deficit hyperactivity disorder (ADHD), combined type  F90.2   2. Moderate recurrent major depression (HCC)  F33.1      Plan:  Patient is to apply thought blocking skill to decrease her anxiety about her upcoming medical appointment for her nerve block.  Patient to continue to advocate for her needs related to medical and in relationships.  Long-term goal:   Identify the major life complexities from the past and present that form the basis for present feelings of anxiety and depression. Short-term goal:  Enhance ability to handle effectively maintain self care while coping with pain associated with medical conditions. Patient will improve her           Positive, realistic and idealistic self talk to keep feelings of anxiety and depression more infrequent.   Assessment of progress:  progressing   Waldron Session, [facility]

## 2020-02-03 ENCOUNTER — Ambulatory Visit: Payer: Self-pay | Admitting: Mental Health

## 2020-02-05 ENCOUNTER — Telehealth: Payer: Self-pay | Admitting: Psychiatry

## 2020-02-05 DIAGNOSIS — F902 Attention-deficit hyperactivity disorder, combined type: Secondary | ICD-10-CM

## 2020-02-05 MED ORDER — METHYLPHENIDATE HCL ER 18 MG PO TB24: 18.0000 mg | ORAL_TABLET | Freq: Every day | ORAL | 0 refills | Status: DC

## 2020-02-05 NOTE — Telephone Encounter (Signed)
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 Lower dose 18 mg was working well. Please Advise @ [phone]

## 2020-02-05 NOTE — Telephone Encounter (Signed)
PT states she is much better on the 18mg  and is asking the 18mg  to be prescribed. [facility]

## 2020-02-07 ENCOUNTER — Ambulatory Visit: Payer: Self-pay | Admitting: Mental Health

## 2020-02-10 ENCOUNTER — Ambulatory Visit (INDEPENDENT_AMBULATORY_CARE_PROVIDER_SITE_OTHER): Payer: 59 | Admitting: Mental Health

## 2020-02-10 ENCOUNTER — Other Ambulatory Visit: Payer: Self-pay

## 2020-02-10 DIAGNOSIS — F331 Major depressive disorder, recurrent, moderate: Secondary | ICD-10-CM | POA: Diagnosis not present

## 2020-02-10 DIAGNOSIS — F902 Attention-deficit hyperactivity disorder, combined type: Secondary | ICD-10-CM | POA: Diagnosis not present

## 2020-02-10 NOTE — Progress Notes (Signed)
         [facility] Counselor/Therapist Progress Note   Patient ID: [name], MRN: [id number]  Date: 02/10/2020  Timespent: 50 minutes  Treatment Type: Individual therapy  Mental Status Exam:   Appearance:   Casual     Behavior:  Appropriate  Motor:  Normal  Speech/Language:   Normal Rate, rhythm   Affect:  Full Range  Mood:  anxious, depressed, pleasant   Thought process:  Coherent and Relevant  Thought content:    Logical, goal directed  Perceptual disturbances:    none  Orientation:  Full (Time, Place, and Person)  Attention:  Good  Concentration:  good  Memory:  intact  Fund of knowledge:   Good  Insight:    Good  Judgment:   Good  Impulse Control:  good    Reported Symptoms:  Depressed mood, anxiety most days, rumination, fatigue, anhedonia, some sleep disturbances  Risk Assessment: Danger to Self:  No Self-injurious Behavior: No Danger to Others: No Duty to Warn:no Physical Aggression / Violence:No  Access to Firearms a concern: No  Gang Involvement:No   Patient / guardian was educated about steps to take if suicide or homicide risk level increases between visits. While future psychiatric events cannot be accurately predicted, the patient does not currently require acute inpatient psychiatric care and does not currently meet [facility] involuntary commitment criteria.  Subjective:  Patient presents for session in some mild distress, sharing how she had some recent medication changes.  She felt one of her medications dosage was too high and has since been lowered over the past few days with positive results per her report.  She stated that she continues to grieve the loss of her dog of several years and impulsively got another dog that is having medical issues.  She plans to address the issue with the seller, and the hopes of getting reimbursed.  Continues to state potential employment options reports having an interview later today.  She continues  to attend all doctor appointments and we can encourage her to do so.  Through guided discovery, she identified ways to set boundaries with her mother and some others by not sharing as many life details as she feels they may criticize or be judgmental of her.  She identified how this will lower her stress particularly with her relationship with out of her mother.  Interventions: CBT, supportive therapy  Diagnosis:   ICD-10-CM   1. Attention deficit hyperactivity disorder (ADHD), combined type  F90.2   2. Moderate recurrent major depression (HCC)  F33.1      Plan:  Patient is to apply thought blocking skill to decrease her anxiety about her upcoming medical appointment for her nerve block.  Patient to continue to advocate for her needs related to medical and in relationships.  Patient to continue to identify ways to set boundaries in relationships to keep stress low.  Long-term goal:   Identify the major life complexities from the past and present that form the basis for present feelings of anxiety and depression. Short-term goal:  Enhance ability to handle effectively maintain self care while coping with pain associated with medical conditions.            Positive, realistic and idealistic self talk to keep feelings of anxiety and depression more infrequent.   Assessment of progress:  progressing   Waldron Session, [facility]

## 2020-02-12 ENCOUNTER — Ambulatory Visit: Payer: Self-pay | Admitting: Psychiatry

## 2020-02-12 ENCOUNTER — Other Ambulatory Visit: Payer: Self-pay

## 2020-02-13 ENCOUNTER — Ambulatory Visit: Payer: Self-pay | Admitting: Family Medicine

## 2020-02-25 ENCOUNTER — Telehealth: Payer: Self-pay | Admitting: Family Medicine

## 2020-02-25 DIAGNOSIS — Z0000 Encounter for general adult medical examination without abnormal findings: Secondary | ICD-10-CM

## 2020-02-25 NOTE — Telephone Encounter (Signed)
Pt would like to come in early to do her lab work for her CPE on June 8th?   Pt can be reached at [phone]

## 2020-02-27 ENCOUNTER — Encounter: Payer: Self-pay | Admitting: Family Medicine

## 2020-02-27 NOTE — Telephone Encounter (Signed)
Yes she can do that. I placed the orders

## 2020-02-27 NOTE — Telephone Encounter (Signed)
Left a detailed message on verified voice mail.   

## 2020-03-03 ENCOUNTER — Encounter: Payer: Self-pay | Admitting: Family Medicine

## 2020-03-04 ENCOUNTER — Ambulatory Visit (INDEPENDENT_AMBULATORY_CARE_PROVIDER_SITE_OTHER): Payer: 59 | Admitting: Mental Health

## 2020-03-04 ENCOUNTER — Other Ambulatory Visit: Payer: Self-pay

## 2020-03-04 DIAGNOSIS — F902 Attention-deficit hyperactivity disorder, combined type: Secondary | ICD-10-CM

## 2020-03-04 DIAGNOSIS — F331 Major depressive disorder, recurrent, moderate: Secondary | ICD-10-CM | POA: Diagnosis not present

## 2020-03-04 NOTE — Progress Notes (Signed)
         [facility] Counselor/Therapist Progress Note   Patient ID: [name], MRN: [id number]  Date: 03/04/2020  Timespent: 50 minutes  Treatment Type: Individual therapy  Mental Status Exam:   Appearance:   Casual     Behavior:  Appropriate  Motor:  Normal  Speech/Language:   Normal Rate, rhythm   Affect:  Full Range  Mood:  anxious, depressed, pleasant   Thought process:  Coherent and Relevant  Thought content:    Logical, goal directed  Perceptual disturbances:    none  Orientation:  Full (Time, Place, and Person)  Attention:  Good  Concentration:  good  Memory:  intact  Fund of knowledge:   Good  Insight:    Good  Judgment:   Good  Impulse Control:  good    Reported Symptoms:  Depressed mood, anxiety most days, rumination, fatigue, anhedonia, some sleep disturbances  Risk Assessment: Danger to Self:  No Self-injurious Behavior: No Danger to Others: No Duty to Warn:no Physical Aggression / Violence:No  Access to Firearms a concern: No  Gang Involvement:No   Patient / guardian was educated about steps to take if suicide or homicide risk level increases between visits. While future psychiatric events cannot be accurately predicted, the patient does not currently require acute inpatient psychiatric care and does not currently meet [facility] involuntary commitment criteria.  Subjective:  Patient presents for session in no distress.  She stated that she has had more contact with her father continuing to assist him with his wife needs due to his medical issues and age.  She shared how having some interactions with them recently brought back memories and feelings during childhood.  She shared how she is having a deeper understanding of how her relationship with her father is impacted her life, some unmet needs and how they have affected her relationships later.  She continues to cope and care for herself by managing her finances carefully.  She stated that  her brother has taken a role in helping in the care of their father which is a change recently.  She continues to identify thoughts that cause her some anxiety about her future due to her limitations physically but is hopeful and tries to reframe them in a more positive light.  Encouraged her to continue to keep her doctor appointments as a way of self-care as well as following through with physical exercise via her physical therapy.  Interventions: CBT, supportive therapy  Diagnosis:   ICD-10-CM   1. Attention deficit hyperactivity disorder (ADHD), combined type  F90.2   2. Moderate recurrent major depression (HCC)  F33.1      Plan:  Patient is to apply thought blocking skill to decrease her anxiety about her upcoming medical appointment for her nerve block.  Patient to continue to advocate for her needs related to medical and in relationships.  Patient to continue to identify ways to set boundaries in relationships to keep stress low.  Long-term goal:   Identify the major life complexities from the past and present that form the basis for present feelings of anxiety and depression. Short-term goal:  Enhance ability to handle effectively maintain self care while coping with pain associated with medical conditions.            Positive, realistic and idealistic self talk to keep feelings of anxiety and depression more infrequent.   Assessment of progress:  progressing   Waldron Session, [facility]

## 2020-03-11 ENCOUNTER — Ambulatory Visit (INDEPENDENT_AMBULATORY_CARE_PROVIDER_SITE_OTHER): Payer: 59 | Admitting: Psychiatry

## 2020-03-11 ENCOUNTER — Ambulatory Visit (INDEPENDENT_AMBULATORY_CARE_PROVIDER_SITE_OTHER): Payer: 59 | Admitting: Mental Health

## 2020-03-11 ENCOUNTER — Encounter: Payer: Self-pay | Admitting: Psychiatry

## 2020-03-11 ENCOUNTER — Other Ambulatory Visit: Payer: Self-pay

## 2020-03-11 DIAGNOSIS — F331 Major depressive disorder, recurrent, moderate: Secondary | ICD-10-CM

## 2020-03-11 DIAGNOSIS — G4700 Insomnia, unspecified: Secondary | ICD-10-CM | POA: Diagnosis not present

## 2020-03-11 DIAGNOSIS — F902 Attention-deficit hyperactivity disorder, combined type: Secondary | ICD-10-CM

## 2020-03-11 DIAGNOSIS — F419 Anxiety disorder, unspecified: Secondary | ICD-10-CM

## 2020-03-11 MED ORDER — METHYLPHENIDATE HCL ER 18 MG PO TB24: 18.0000 mg | ORAL_TABLET | Freq: Every day | ORAL | 0 refills | Status: DC

## 2020-03-11 MED ORDER — BUPROPION HCL ER (XL) 150 MG PO TB24: 300.0000 mg | ORAL_TABLET | Freq: Every day | ORAL | 0 refills | Status: DC

## 2020-03-11 MED ORDER — HYDROXYZINE HCL 10 MG PO TABS: 10.0000 mg | ORAL_TABLET | Freq: Three times a day (TID) | ORAL | 0 refills | Status: DC | PRN

## 2020-03-11 MED ORDER — GABAPENTIN 400 MG PO CAPS: ORAL_CAPSULE | ORAL | 0 refills | Status: DC

## 2020-03-11 MED ORDER — ALPRAZOLAM 1 MG PO TABS: 1.0000 mg | ORAL_TABLET | Freq: Two times a day (BID) | ORAL | 2 refills | Status: DC | PRN

## 2020-03-11 NOTE — Progress Notes (Signed)
[name] [id number] 1966-06-27 54 y.o.  Subjective:   Patient ID:  [name] is a 54 y.o. (DOB 1966-05-06) female.  Chief Complaint:  Chief Complaint  Patient presents with  . Follow-up    h/o Anxiety, Depression, ADD, and insomnia    HPI [name] presents to the office today for follow-up of ADD, Depression, anxiety. She reports that she is "medication-wise, doing well." She reports that she is tolerating Concerta 18 mg po qd and not having the side effects she had at 27 mg. She notices some changes in s/s when Concerta wears off. She reports that her anxiety is well controlled with Gabapentin and notices anxiety if she misses Gabapentin. Anxiety has been well controlled overall with some episodic anxiety. Will have anxious thoughts when trying to fall asleep and needs to take medication (Hydroxyzine and Lorazepam) in order to fall asleep. Sleep is adequate with medication.  Taking Hydroxyzine prn during the day. She reports some mild depression. Trying to walk some since exercise is helpful for her mental health. She reports adequate energy and motivation. Reports concerta has been helpful for concentration and focus. Appetite has been stable. She reports that weight  is now at her baseline.  Has been trying to help both of her parents.   Past Psychiatric Medication Trials: Gabapentin- prescribed for nerve pain. Needs 600 mg to help weightwith insomnia. Has been helpful for anxiety and pain.  Cymbalta- Gained 20 lbs in 3 months. Had severe discontinuation s/s.  Zoloft- Felt groggy and "spacey." Sexual side effects.  Prozac- felt groggy and spacey. "All I wanted to do was cry." Wellbutrin XL- Has been helpful for depression and has taken it periodically. Took 150 mg po qd. Last took around 2018.  Wellbutrin SR- Felt that 100 mg was ineffective. Alprazolam- Taken prn for 6 years. Diazepam Trazodone- jitteriness. Paradoxical effect. Hydroxyzine-  effective for anxiety    PHQ2-9     Telemedicine from 07/04/2019 in [facility] at [employer] from 06/13/2017 in [facility] at [facility] Total Score 3 0       Review of Systems:  Review of Systems  Musculoskeletal: Positive for back pain. Negative for gait problem.  Neurological: Negative for tremors.  Psychiatric/Behavioral:       Please refer to HPI    Medications: I have reviewed the patient's current medications.  Current Outpatient Medications  Medication Sig Dispense Refill  . albuterol (VENTOLIN HFA) 108 (90 Base) MCG/ACT inhaler INHALE 2 PUFFS INTO THE LUNGS EVERY 4 (FOUR) HOURS AS NEEDED FOR WHEEZING OR SHORTNESS OF BREATH. 18 g 5  . [START ON 03/27/2020] ALPRAZolam (XANAX) 1 MG tablet Take 1 tablet (1 mg total) by mouth 2 (two) times daily as needed for anxiety. 60 tablet 2  . amoxicillin-clavulanate (AUGMENTIN) 875-125 MG tablet Take 1 tablet by mouth 2 (two) times daily. 20 tablet 0  . ASHWAGANDHA PO Take by mouth.    Marland Kitchen buPROPion (WELLBUTRIN XL) 150 MG 24 hr tablet Take 2 tablets (300 mg total) by mouth daily. 180 tablet 0  . fluticasone (FLONASE) 50 MCG/ACT nasal spray SPRAY 2 SPRAYS INTO EACH NOSTRIL EVERY DAY 48 mL 2  . Fluticasone Propionate, Inhal, (FLOVENT DISKUS) 100 MCG/BLIST AEPB INHALE 1 PUFF INTO THE LUNGS DAILY 180 each 1  . gabapentin (NEURONTIN) 400 MG capsule TAKE 1 CAPSULE BY MOUTH 3 TIMES A DAY AND 2 CAPSULES AT BEDTIME 450 capsule 0  . HYDROcodone-homatropine (HYDROMET) 5-1.5 MG/5ML syrup Take 5 mLs  by mouth every 4 (four) hours as needed. 240 mL 0  . hydrOXYzine (ATARAX/VISTARIL) 10 MG tablet Take 1 tablet (10 mg total) by mouth 3 (three) times daily as needed. 270 tablet 0  . ibuprofen (ADVIL) 800 MG tablet Take 1 tablet (800 mg total) by mouth every 8 (eight) hours as needed for moderate pain. 60 tablet 5  . meloxicam (MOBIC) 15 MG tablet Mobic 15 mg tablet   1 tablet every day by oral route.    . methylphenidate 18  MG PO CR tablet Take 1 tablet (18 mg total) by mouth daily. 30 tablet 0  . [START ON 04/08/2020] methylphenidate 18 MG PO CR tablet Take 1 tablet (18 mg total) by mouth daily. 30 tablet 0  . [START ON 05/06/2020] methylphenidate 18 MG PO CR tablet Take 1 tablet (18 mg total) by mouth daily. 30 tablet 0  . methylPREDNISolone (MEDROL DOSEPAK) 4 MG TBPK tablet As directed 21 tablet 0  . montelukast (SINGULAIR) 10 MG tablet TAKE 1 TABLET BY MOUTH EVERYDAY AT BEDTIME 90 tablet 3  . ondansetron (ZOFRAN) 4 MG tablet Take 1 tablet (4 mg total) by mouth every 8 (eight) hours as needed for nausea or vomiting. 20 tablet 0  . ondansetron (ZOFRAN) 4 MG tablet Zofran 4 mg tablet  Take 2 tablets every 8 hours by oral route as needed for 5 days.  Take 1-2 tabs every 8 hours as needed for nausea    . rizatriptan (MAXALT-MLT) 10 MG disintegrating tablet Take 1 tablet (10 mg total) by mouth as needed for migraine. May repeat in 2 hours if needed 30 tablet 3  . traMADol (ULTRAM) 50 MG tablet Take 50 mg by mouth 3 (three) times daily as needed.    . valACYclovir (VALTREX) 500 MG tablet Take 1 tablet (500 mg total) by mouth 2 (two) times daily. 180 tablet 3   No current facility-administered medications for this visit.    Medication Side Effects: None  Allergies: No Known Allergies  Past Medical History:  Diagnosis Date  . Anxiety   . Asthma    seasonal  . Headache   . Pelvic pain     Family History  Problem Relation Age of Onset  . Hypertension Mother   . Anxiety disorder Mother   . Depression Mother   . Breast cancer Other        GREAT AUNT  . ADD / ADHD Father     Social History   Socioeconomic History  . Marital status: Divorced    Spouse name: Not on file  . Number of children: Not on file  . Years of education: Not on file  . Highest education level: Not on file  Occupational History  . Not on file  Tobacco Use  . Smoking status: Never Smoker  . Smokeless tobacco: Never Used  Vaping  Use  . Vaping Use: Never used  Substance and Sexual Activity  . Alcohol use: Not Currently    Alcohol/week: 1.0 standard drink    Types: 1 Glasses of wine per week  . Drug use: No  . Sexual activity: Not Currently  Other Topics Concern  . Not on file  Social History Narrative  . Not on file   Social Determinants of Health   Financial Resource Strain:   . Difficulty of Paying Living Expenses:   Food Insecurity:   . Worried About Programme researcher, broadcasting/film/video in the Last Year:   . [employer] of Food in the Last  Year:   Transportation Needs:   . Film/video editor (Medical):   Marland Kitchen Lack of Transportation (Non-Medical):   Physical Activity:   . Days of Exercise per Week:   . Minutes of Exercise per Session:   Stress:   . Feeling of Stress :   Social Connections:   . Frequency of Communication with Friends and Family:   . Frequency of Social Gatherings with Friends and Family:   . Attends Religious Services:   . Active Member of Clubs or Organizations:   . Attends Archivist Meetings:   Marland Kitchen Marital Status:   Intimate Partner Violence:   . Fear of Current or Ex-Partner:   . Emotionally Abused:   Marland Kitchen Physically Abused:   . Sexually Abused:     Past Medical History, Surgical history, Social history, and Family history were reviewed and updated as appropriate.   Please see review of systems for further details on the patient's review from today.   Objective:   Physical Exam:  BP 110/66   Pulse 94   LMP 02/19/2009 (LMP Unknown)   Physical Exam Constitutional:      General: She is not in acute distress. Musculoskeletal:        General: No deformity.  Neurological:     Mental Status: She is alert and oriented to person, place, and time.     Coordination: Coordination normal.  Psychiatric:        Attention and Perception: Attention and perception normal. She does not perceive auditory or visual hallucinations.        Mood and Affect: Mood normal. Mood is not anxious or  depressed. Affect is not labile, blunt, angry or inappropriate.        Speech: Speech normal.        Behavior: Behavior normal.        Thought Content: Thought content normal. Thought content is not paranoid or delusional. Thought content does not include homicidal or suicidal ideation. Thought content does not include homicidal or suicidal plan.        Cognition and Memory: Cognition and memory normal.        Judgment: Judgment normal.     Comments: Insight intact     Lab Review:     Component Value Date/Time   NA 140 08/15/2019 1007   K 3.9 08/15/2019 1007   CL 103 08/15/2019 1007   CO2 28 08/15/2019 1007   GLUCOSE 98 08/15/2019 1007   BUN 12 08/15/2019 1007   CREATININE 1.05 08/15/2019 1007   CREATININE 0.83 03/13/2017 1211   CALCIUM 9.7 08/15/2019 1007   PROT 7.1 03/15/2018 0846   ALBUMIN 4.3 03/15/2018 0846   AST 13 03/15/2018 0846   ALT 9 03/15/2018 0846   ALKPHOS 97 03/15/2018 0846   BILITOT 0.3 03/15/2018 0846   GFRNONAA >60 01/30/2019 1424   GFRAA >60 01/30/2019 1424       Component Value Date/Time   WBC 12.6 (H) 01/30/2019 1424   RBC 4.06 01/30/2019 1424   HGB 12.2 01/30/2019 1424   HCT 36.8 01/30/2019 1424   PLT 229 01/30/2019 1424   MCV 90.6 01/30/2019 1424   MCH 30.0 01/30/2019 1424   MCHC 33.2 01/30/2019 1424   RDW 13.2 01/30/2019 1424   LYMPHSABS 1.3 03/15/2018 0846   MONOABS 0.5 03/15/2018 0846   EOSABS 0.1 03/15/2018 0846   BASOSABS 0.0 03/15/2018 0846    No results found for: POCLITH, LITHIUM   No results found for: PHENYTOIN, PHENOBARB, VALPROATE, CBMZ   .  res Assessment: Plan:   Will continue current plan of care since target signs and symptoms are well controlled without any tolerability issues. Continue hydroxyzine prn anxiety and insomnia. Continue Gabapentin for off label indication for anxiety and insomnia. Pt reports that Gabapentin has also been helpful for her pain.  Continue Wellbutrin XL 300 mg po qd for depression.  Continue  Concerta 18 mg po qd for ADD. Pt to f/u in 3 months or sooner if clinically indicated.  Recommend continuing therapy with Elio Forget, [facility]. Patient advised to contact office with any questions, adverse effects, or acute worsening in signs and symptoms.  Sarahi was seen today for follow-up.  Diagnoses and all orders for this visit:  Attention deficit hyperactivity disorder (ADHD), combined type -     methylphenidate 18 MG PO CR tablet; Take 1 tablet (18 mg total) by mouth daily. -     methylphenidate 18 MG PO CR tablet; Take 1 tablet (18 mg total) by mouth daily. -     methylphenidate 18 MG PO CR tablet; Take 1 tablet (18 mg total) by mouth daily.  Anxiety -     hydrOXYzine (ATARAX/VISTARIL) 10 MG tablet; Take 1 tablet (10 mg total) by mouth 3 (three) times daily as needed. -     gabapentin (NEURONTIN) 400 MG capsule; TAKE 1 CAPSULE BY MOUTH 3 TIMES A DAY AND 2 CAPSULES AT BEDTIME -     ALPRAZolam (XANAX) 1 MG tablet; Take 1 tablet (1 mg total) by mouth 2 (two) times daily as needed for anxiety.  Insomnia, unspecified type -     gabapentin (NEURONTIN) 400 MG capsule; TAKE 1 CAPSULE BY MOUTH 3 TIMES A DAY AND 2 CAPSULES AT BEDTIME  Moderate recurrent major depression (HCC) -     buPROPion (WELLBUTRIN XL) 150 MG 24 hr tablet; Take 2 tablets (300 mg total) by mouth daily.     Please see After Visit Summary for patient specific instructions.  Future Appointments  Date Time Provider Department Center  03/25/2020  3:00 PM Waldron Session, [facility] None  04/14/2020 10:00 AM Waldron Session, [facility] None  04/21/2020 11:00 AM Waldron Session, [facility] CP-[facility] None  04/28/2020 10:00 AM Waldron Session, [facility] CP-CP None  05/27/2020 10:30 AM Corie Chiquito, [facility] CP-CP None    No orders of the defined types were placed in this encounter.   -------------------------------

## 2020-03-11 NOTE — Progress Notes (Signed)
         [facility] Counselor/Therapist Progress Note   Patient ID: [name], MRN: [id number]  Date: 03/11/20  Timespent: 51 minutes  Treatment Type: Individual therapy  Mental Status Exam:   Appearance:   Casual     Behavior:  Appropriate  Motor:  Normal  Speech/Language:   Normal Rate, rhythm   Affect:  Full Range  Mood:  anxious, depressed, pleasant   Thought process:  Coherent and Relevant  Thought content:    Logical, goal directed  Perceptual disturbances:    none  Orientation:  Full (Time, Place, and Person)  Attention:  Good  Concentration:  good  Memory:  intact  Fund of knowledge:   Good  Insight:    Good  Judgment:   Good  Impulse Control:  good    Reported Symptoms:  Depressed mood, anxiety most days, rumination, fatigue, anhedonia, some sleep disturbances  Risk Assessment: Danger to Self:  No Self-injurious Behavior: No Danger to Others: No Duty to Warn:no Physical Aggression / Violence:No  Access to Firearms a concern: No  Gang Involvement:No   Patient / guardian was educated about steps to take if suicide or homicide risk level increases between visits. While future psychiatric events cannot be accurately predicted, the patient does not currently require acute inpatient psychiatric care and does not currently meet [facility] involuntary commitment criteria.  Subjective:  Patient presents for session in no distress.  She shared progress, how she continues to gain insight into the relationship she has currently with her father and also recollects about past experiences.  Continues to explore and self evaluate and explore unmet needs in this relationship at times that have transcended into other relationships in her life.  The concept of trust and security were identified.  She is considering trying to slowly take steps to manage her stress by setting boundaries in the relationship with her mother.  She provides detail with how she has taken  some steps in the past and plans to continue to do so.  She shared when she is attempting to continue to cope and care for herself, attending doctor appointments and utilizing her support system which is some family and friends.  She identified her efforts to remind herself to be helpful about her situation, trying to keep a "positive attitude".  Interventions: CBT, supportive therapy  Diagnosis:   ICD-10-CM   1. Attention deficit hyperactivity disorder (ADHD), combined type  F90.2   2. Anxiety  F41.9   3. Moderate recurrent major depression (HCC)  F33.1      Plan:  Patient is to apply thought blocking skill to decrease her anxiety about her upcoming medical appointment for her nerve block.  Patient to continue to advocate for her needs related to medical and in relationships.  Patient to continue to identify ways to set boundaries in relationships to keep stress low.  Long-term goal:   Identify the major life complexities from the past and present that form the basis for present feelings of anxiety and depression. Short-term goal:  Enhance ability to handle effectively maintain self care while coping with pain associated with medical conditions.            Positive, realistic and idealistic self talk to keep feelings of anxiety and depression more infrequent.   Assessment of progress:  progressing   Waldron Session, [facility]

## 2020-03-25 ENCOUNTER — Ambulatory Visit: Payer: 59 | Admitting: Mental Health

## 2020-03-25 ENCOUNTER — Other Ambulatory Visit: Payer: Self-pay

## 2020-03-25 ENCOUNTER — Ambulatory Visit (INDEPENDENT_AMBULATORY_CARE_PROVIDER_SITE_OTHER): Payer: 59 | Admitting: Mental Health

## 2020-03-25 DIAGNOSIS — F902 Attention-deficit hyperactivity disorder, combined type: Secondary | ICD-10-CM

## 2020-03-25 DIAGNOSIS — F419 Anxiety disorder, unspecified: Secondary | ICD-10-CM | POA: Diagnosis not present

## 2020-03-25 NOTE — Progress Notes (Signed)
         [facility] Counselor/Therapist Progress Note   Patient ID: [name], MRN: [id number]  Date: 03/25/2020  Timespent: 50 minutes  Treatment Type: Individual therapy  Mental Status Exam:   Appearance:   Casual     Behavior:  Appropriate  Motor:  Normal  Speech/Language:   Normal Rate, rhythm   Affect:  Full Range  Mood:  anxious, depressed, pleasant   Thought process:  Coherent and Relevant  Thought content:    Logical, goal directed  Perceptual disturbances:    none  Orientation:  Full (Time, Place, and Person)  Attention:  Good  Concentration:  good  Memory:  intact  Fund of knowledge:   Good  Insight:    Good  Judgment:   Good  Impulse Control:  good    Reported Symptoms:  Depressed mood, anxiety most days, rumination, fatigue, anhedonia, some sleep disturbances  Risk Assessment: Danger to Self:  No Self-injurious Behavior: No Danger to Others: No Duty to Warn:no Physical Aggression / Violence:No  Access to Firearms a concern: No  Gang Involvement:No   Patient / guardian was educated about steps to take if suicide or homicide risk level increases between visits. While future psychiatric events cannot be accurately predicted, the patient does not currently require acute inpatient psychiatric care and does not currently meet [facility] involuntary commitment criteria.  Subjective:  Patient presents for session in some apparent distress.  She stated she has had increased back pain, and recently saw her doctor and who plans to give her an injection to assist with discomfort and pain management.  She continues to care for her father, sharing recent efforts and how the relationship is changed.  She identified some specific ways she has set boundaries with some of her family members, her mother specifically, related to how much information about her life stressors with which she discloses.  She identified this need based on past experiences and  identified potential outcomes which will keep her stress and anxiety lower.  She continues to cope with a level of financial stress but works diligently to manage and budget, given her uncertain future related to her medical needs.  She identified cognitions related to being more hopeful about her situation, how she utilizes some friendships as some of her main support system during this time.  Ways to continue to cope and care for herself were reviewed with her plan to follow through.  Interventions: CBT, supportive therapy  Diagnosis:   ICD-10-CM   1. Anxiety  F41.9   2. Attention deficit hyperactivity disorder (ADHD), combined type  F90.2      Plan:  Patient is to apply thought blocking skill to decrease her anxiety about her upcoming medical appointment for her nerve block.  Patient to continue to advocate for her needs related to medical and in relationships.  Patient to continue to identify ways to set boundaries in relationships to keep stress low.  Long-term goal:   Identify the major life complexities from the past and present that form the basis for present feelings of anxiety and depression. Short-term goal:  Enhance ability to handle effectively maintain self care while coping with pain associated with medical conditions.            Positive, realistic and idealistic self talk to keep feelings of anxiety and depression more infrequent.   Assessment of progress:  progressing   Waldron Session, [facility]

## 2020-03-26 ENCOUNTER — Other Ambulatory Visit: Payer: Self-pay | Admitting: Psychiatry

## 2020-03-26 DIAGNOSIS — F419 Anxiety disorder, unspecified: Secondary | ICD-10-CM

## 2020-04-14 ENCOUNTER — Other Ambulatory Visit: Payer: Self-pay

## 2020-04-14 ENCOUNTER — Ambulatory Visit (INDEPENDENT_AMBULATORY_CARE_PROVIDER_SITE_OTHER): Payer: 59 | Admitting: Mental Health

## 2020-04-14 DIAGNOSIS — F419 Anxiety disorder, unspecified: Secondary | ICD-10-CM | POA: Diagnosis not present

## 2020-04-14 DIAGNOSIS — F902 Attention-deficit hyperactivity disorder, combined type: Secondary | ICD-10-CM | POA: Diagnosis not present
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
## 2020-04-21 ENCOUNTER — Ambulatory Visit: Payer: 59 | Admitting: Mental Health

## 2020-04-22 ENCOUNTER — Other Ambulatory Visit: Payer: Self-pay

## 2020-04-22 ENCOUNTER — Ambulatory Visit (INDEPENDENT_AMBULATORY_CARE_PROVIDER_SITE_OTHER): Payer: 59 | Admitting: Mental Health

## 2020-04-22 DIAGNOSIS — F902 Attention-deficit hyperactivity disorder, combined type: Secondary | ICD-10-CM

## 2020-04-22 DIAGNOSIS — F419 Anxiety disorder, unspecified: Secondary | ICD-10-CM | POA: Diagnosis not present
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
## 2020-04-28 ENCOUNTER — Ambulatory Visit: Payer: 59 | Admitting: Mental Health

## 2020-04-29 ENCOUNTER — Other Ambulatory Visit: Payer: Self-pay

## 2020-04-29 ENCOUNTER — Encounter: Payer: Self-pay | Admitting: Family Medicine

## 2020-04-29 ENCOUNTER — Ambulatory Visit (INDEPENDENT_AMBULATORY_CARE_PROVIDER_SITE_OTHER): Payer: 59 | Admitting: Family Medicine

## 2020-04-29 VITALS — BP 122/60 | HR 91 | Temp 97.8°F | Wt 118.2 lb

## 2020-04-29 DIAGNOSIS — B379 Candidiasis, unspecified: Secondary | ICD-10-CM

## 2020-04-29 MED ORDER — FLUCONAZOLE 200 MG PO TABS: 200.0000 mg | ORAL_TABLET | Freq: Every day | ORAL | 2 refills | Status: DC

## 2020-04-29 NOTE — Progress Notes (Signed)
   Subjective:    Patient ID: [name], female    DOB: 12-May-1966, 54 y.o.   MRN: [id number]  HPI Here for itching over the arms and trunk, which she thinks is due to a yeast infection. She has had plants in her home for years, but she has never had an orchid until recently. Several weeks ago a friend gave her some orchids, and they stayed healthy for about one week. Then they suddenly began dropping leaves so [name] assumed this was due to an insect infestation. She got rid of the orchids and installed a dehumidifier in her home. However she then developed a light vaginal DC and sh began to itch over her torso. She has been using OTC Monistat to no avail.    Review of Systems  Constitutional: Negative.   Respiratory: Negative.   Cardiovascular: Negative.   Genitourinary: Positive for vaginal discharge.       Objective:   Physical Exam Constitutional:      Appearance: Normal appearance.  Cardiovascular:     Rate and Rhythm: Normal rate and regular rhythm.     Pulses: Normal pulses.     Heart sounds: Normal heart sounds.  Pulmonary:     Effort: Pulmonary effort is normal.     Breath sounds: Normal breath sounds.  Skin:    Findings: No erythema or rash.  Neurological:     Mental Status: She is alert.           Assessment & Plan:  Candidiasis, treat with 5 days of Diflucan 200 mg daily. Recheck prn. Gershon Crane, MD

## 2020-05-05 ENCOUNTER — Other Ambulatory Visit: Payer: Self-pay | Admitting: Family Medicine

## 2020-05-11 ENCOUNTER — Other Ambulatory Visit: Payer: Self-pay | Admitting: Psychiatry

## 2020-05-11 DIAGNOSIS — F419 Anxiety disorder, unspecified: Secondary | ICD-10-CM

## 2020-05-14 ENCOUNTER — Ambulatory Visit: Payer: 59 | Admitting: Psychiatry

## 2020-05-14 ENCOUNTER — Ambulatory Visit: Payer: 59 | Admitting: Family Medicine

## 2020-05-14 ENCOUNTER — Emergency Department (HOSPITAL_COMMUNITY): Payer: 59

## 2020-05-14 ENCOUNTER — Telehealth: Payer: Self-pay | Admitting: Family Medicine

## 2020-05-14 ENCOUNTER — Other Ambulatory Visit: Payer: Self-pay

## 2020-05-14 ENCOUNTER — Encounter (HOSPITAL_COMMUNITY): Payer: Self-pay

## 2020-05-14 ENCOUNTER — Emergency Department (HOSPITAL_COMMUNITY)
Admission: EM | Admit: 2020-05-14 | Discharge: 2020-05-14 | Disposition: A | Discharge status: Home / Self Care | Payer: 59 | Attending: Emergency Medicine | Admitting: Emergency Medicine

## 2020-05-14 DIAGNOSIS — R1084 Generalized abdominal pain: Secondary | ICD-10-CM | POA: Insufficient documentation

## 2020-05-14 DIAGNOSIS — R112 Nausea with vomiting, unspecified: Secondary | ICD-10-CM | POA: Insufficient documentation

## 2020-05-14 DIAGNOSIS — R300 Dysuria: Secondary | ICD-10-CM | POA: Insufficient documentation

## 2020-05-14 DIAGNOSIS — Z79899 Other long term (current) drug therapy: Secondary | ICD-10-CM | POA: Insufficient documentation

## 2020-05-14 DIAGNOSIS — R509 Fever, unspecified: Secondary | ICD-10-CM | POA: Insufficient documentation

## 2020-05-14 DIAGNOSIS — E860 Dehydration: Secondary | ICD-10-CM

## 2020-05-14 DIAGNOSIS — K5900 Constipation, unspecified: Secondary | ICD-10-CM | POA: Insufficient documentation

## 2020-05-14 DIAGNOSIS — R531 Weakness: Secondary | ICD-10-CM | POA: Diagnosis not present

## 2020-05-14 DIAGNOSIS — J45998 Other asthma: Secondary | ICD-10-CM | POA: Insufficient documentation

## 2020-05-14 LAB — URINALYSIS, ROUTINE W REFLEX MICROSCOPIC
Glucose, UA: NEGATIVE mg/dL
Hgb urine dipstick: NEGATIVE
Ketones, ur: 5 mg/dL — AB
Nitrite: NEGATIVE
Protein, ur: 30 mg/dL — AB
Specific Gravity, Urine: 1.03 (ref 1.005–1.030)
pH: 5 (ref 5.0–8.0)

## 2020-05-14 LAB — COMPREHENSIVE METABOLIC PANEL
ALT: 13 U/L (ref 0–44)
AST: 19 U/L (ref 15–41)
Albumin: 3.9 g/dL (ref 3.5–5.0)
Alkaline Phosphatase: 85 U/L (ref 38–126)
Anion gap: 14 (ref 5–15)
BUN: 21 mg/dL — ABNORMAL HIGH (ref 6–20)
CO2: 18 mmol/L — ABNORMAL LOW (ref 22–32)
Calcium: 8.9 mg/dL (ref 8.9–10.3)
Chloride: 105 mmol/L (ref 98–111)
Creatinine, Ser: 0.91 mg/dL (ref 0.44–1.00)
GFR calc Af Amer: >60 mL/min (ref >60)
GFR calc non Af Amer: >60 mL/min (ref >60)
Glucose, Bld: 165 mg/dL — ABNORMAL HIGH (ref 70–99)
Potassium: 4.1 mmol/L (ref 3.5–5.1)
Sodium: 137 mmol/L (ref 135–145)
Total Bilirubin: 0.7 mg/dL (ref 0.3–1.2)
Total Protein: 6.6 g/dL (ref 6.5–8.1)

## 2020-05-14 LAB — CBC
HCT: 44.8 % (ref 36.0–46.0)
Hemoglobin: 14.7 g/dL (ref 12.0–15.0)
MCH: 29.9 pg (ref 26.0–34.0)
MCHC: 32.8 g/dL (ref 30.0–36.0)
MCV: 91.1 fL (ref 80.0–100.0)
Platelets: 338 10*3/uL (ref 150–400)
RBC: 4.92 MIL/uL (ref 3.87–5.11)
RDW: 13.1 % (ref 11.5–15.5)
WBC: 13.8 10*3/uL — ABNORMAL HIGH (ref 4.0–10.5)
nRBC: 0 % (ref 0.0–0.2)

## 2020-05-14 LAB — LIPASE, BLOOD: Lipase: 23 U/L (ref 11–51)

## 2020-05-14 LAB — CK: Total CK: 32 U/L — ABNORMAL LOW (ref 38–234)

## 2020-05-14 MED ORDER — SODIUM CHLORIDE 0.9 % IV BOLUS
1000.0000 mL | Freq: Once | INTRAVENOUS | Status: AC
  Administered 2020-05-14: 1000 mL via INTRAVENOUS

## 2020-05-14 MED ORDER — PHENAZOPYRIDINE HCL 200 MG PO TABS: 200.0000 mg | ORAL_TABLET | Freq: Three times a day (TID) | ORAL | 0 refills | Status: DC

## 2020-05-14 MED ORDER — ONDANSETRON HCL 4 MG/2ML IJ SOLN
4.0000 mg | Freq: Once | INTRAMUSCULAR | Status: AC
  Administered 2020-05-14: 4 mg via INTRAVENOUS
  Filled 2020-05-14: qty 2

## 2020-05-14 MED ORDER — HYDROMORPHONE HCL 1 MG/ML IJ SOLN
0.5000 mg | Freq: Once | INTRAMUSCULAR | Status: AC
  Administered 2020-05-14: 0.5 mg via INTRAVENOUS
  Filled 2020-05-14: qty 1

## 2020-05-14 MED ORDER — CEPHALEXIN 500 MG PO CAPS: 500.0000 mg | ORAL_CAPSULE | Freq: Three times a day (TID) | ORAL | 0 refills | Status: DC

## 2020-05-14 MED ORDER — SODIUM CHLORIDE (PF) 0.9 % IJ SOLN
INTRAMUSCULAR | Status: AC
  Filled 2020-05-14: qty 50

## 2020-05-14 MED ORDER — HALOPERIDOL LACTATE 5 MG/ML IJ SOLN
2.0000 mg | Freq: Once | INTRAMUSCULAR | Status: AC
  Administered 2020-05-14: 2 mg via INTRAVENOUS
  Filled 2020-05-14: qty 1

## 2020-05-14 MED ORDER — PROMETHAZINE HCL 25 MG PO TABS: 25.0000 mg | ORAL_TABLET | Freq: Four times a day (QID) | ORAL | 0 refills | Status: DC | PRN

## 2020-05-14 MED ORDER — IOHEXOL 300 MG/ML  SOLN
100.0000 mL | Freq: Once | INTRAMUSCULAR | Status: AC | PRN
  Administered 2020-05-14: 80 mL via INTRAVENOUS

## 2020-05-14 NOTE — ED Notes (Signed)
Urine is tea colored.

## 2020-05-14 NOTE — ED Provider Notes (Signed)
Accepted handoff at shift change from [facility]. Please see prior provider note for more detail.   Briefly: Patient is 54 y.o.   DDX: concern for SBO/LBO  Plan: Follow-up on CT scan if negative likely will discharge after successful p.o. challenge   Physical Exam  BP 107/69 (BP Location: Left Arm)   Pulse 91   Temp 97.9 F (36.6 C) (Oral)   Resp 16   LMP 02/19/2009 (LMP Unknown)   SpO2 100%   CONSTITUTIONAL:  well-appearing, NAD NEURO:  Alert and oriented x 3, no focal deficits EYES:  pupils equal and reactive ENT/NECK:  trachea midline, no JVD CARDIO:  reg rate, reg rhythm, well-perfused PULM:  None labored breathing GI/GU:  Abdomen non-distended, lower abdominal TTP MSK/SPINE:  No gross deformities, no edema SKIN:  no rash obvious, atraumatic, no ecchymosis  PSYCH:  Appropriate speech and behavior   ED Course/Procedures     Procedures Results for orders placed or performed during the hospital encounter of 05/14/20  Lipase, blood  Result Value Ref Range   Lipase 23 11 - 51 U/L  Comprehensive metabolic panel  Result Value Ref Range   Sodium 137 135 - 145 mmol/L   Potassium 4.1 3.5 - 5.1 mmol/L   Chloride 105 98 - 111 mmol/L   CO2 18 (L) 22 - 32 mmol/L   Glucose, Bld 165 (H) 70 - 99 mg/dL   BUN 21 (H) 6 - 20 mg/dL   Creatinine, Ser 2.02 0.44 - 1.00 mg/dL   Calcium 8.9 8.9 - 54.2 mg/dL   Total Protein 6.6 6.5 - 8.1 g/dL   Albumin 3.9 3.5 - 5.0 g/dL   AST 19 15 - 41 U/L   ALT 13 0 - 44 U/L   Alkaline Phosphatase 85 38 - 126 U/L   Total Bilirubin 0.7 0.3 - 1.2 mg/dL   GFR calc non Af Amer >60 >60 mL/min   GFR calc Af Amer >60 >60 mL/min   Anion gap 14 5 - 15  CBC  Result Value Ref Range   WBC 13.8 (H) 4.0 - 10.5 K/uL   RBC 4.92 3.87 - 5.11 MIL/uL   Hemoglobin 14.7 12.0 - 15.0 g/dL   HCT 70.6 36 - 46 %   MCV 91.1 80.0 - 100.0 fL   MCH 29.9 26.0 - 34.0 pg   MCHC 32.8 30.0 - 36.0 g/dL   RDW 23.7 62.8 - 31.5 %   Platelets 338 150 - 400 K/uL   nRBC 0.0  0.0 - 0.2 %  Urinalysis, Routine w reflex microscopic Urine, Clean Catch  Result Value Ref Range   Color, Urine AMBER (A) YELLOW   APPearance CLEAR CLEAR   Specific Gravity, Urine 1.030 1.005 - 1.030   pH 5.0 5.0 - 8.0   Glucose, UA NEGATIVE NEGATIVE mg/dL   Hgb urine dipstick NEGATIVE NEGATIVE   Bilirubin Urine SMALL (A) NEGATIVE   Ketones, ur 5 (A) NEGATIVE mg/dL   Protein, ur 30 (A) NEGATIVE mg/dL   Nitrite NEGATIVE NEGATIVE   Leukocytes,Ua TRACE (A) NEGATIVE   RBC / HPF 0-5 0 - 5 RBC/hpf   WBC, UA 0-5 0 - 5 WBC/hpf   Bacteria, UA RARE (A) NONE SEEN   Squamous Epithelial / LPF 0-5 0 - 5   Mucus PRESENT    Hyaline Casts, UA PRESENT   CK  Result Value Ref Range   Total CK 32 (L) 38.0 - 234.0 U/L   No results found.  MDM   CT scan shows  no true obstruction.  There is thickened appearing stool in the colon with some pericolonic stranding.  Questionable ileus.\   IMPRESSION:  1. Fluid-filled and mildly thickened terminal ileum as well as a  mildly distended and fluid-filled proximal colon with faint  pericolonic stranding and to the level of the mid sigmoid where more  inspissated stool is seen in the distal sigmoid and rectal vault.  Findings could suggest an enterocolitis of either infectious or  inflammatory etiology with possible adynamic colonic ileus,  correlate with clinical symptoms.  2. Slight prominence of the biliary tree likely related to reservoir  effect though does appear slightly increased from comparison in 2018  no visible calcified intraductal gallstones. Recommend correlation  with liver serologies and if there is clinical concern for  choledocholithiasis, MRCP could be obtained following patient  stabilization.  3. Bladder wall thickening is possibly related to underdistention  though given some reported urinary symptoms, should correlate with  urinalysis to exclude cystitis.  4. Aortic Atherosclerosis (ICD10-I70.0).   I discussed the results of  the CT scan with my attending physician Dr. Particia Nearing who is in agreement with plan to disposition patient home with follow-up with PCP.  I reassessed patient at bedside.  She states that she did have a bowel movement yesterday although she states it was very small 1.  She has not passed gas today.  I reviewed the patient's urinalysis and she states that she has had some burning with urination think that she will benefit from antibiotics.  Patient agreeable to discharge at this time.  She did have 1 additional episode of vomiting after which time I gave patient 2 mg of Haldol.  She is tolerating p.o. without difficulty after this.  No evidence of any changes in her status.  Vital signs within normal limits on my recheck.  Will discharge with close follow-up with PCP.  She is given return precautions.      Solon Augusta [place], [place] 05/14/20 1603    Jacalyn Lefevre, MD 05/15/20 [phone]

## 2020-05-14 NOTE — Discharge Instructions (Signed)

## 2020-05-14 NOTE — Telephone Encounter (Signed)
ATC, unable to leave a voice mail. Pt will need a 30 minute hospital follow up. Please schedule this when she calls back.

## 2020-05-14 NOTE — ED Notes (Signed)
Pt tolerated PO fluids. Pt stated she called an Benedetto Goad to take her home.   An After Visit Summary was printed and given to the patient. Discharge instructions given and no further questions at this time.

## 2020-05-14 NOTE — ED Provider Notes (Signed)
[facility]-EMERGENCY DEPT Provider Note   CSN: [id number] Arrival date & time: 05/14/20  0433     History Chief Complaint  Patient presents with  . Emesis    [name] is a 54 y.o. female.  Patient to ED with c/o generalized abdominal pain, TNTC episodes of emesis. She denies diarrhea and reports no bowel movement in 3 days. Not currently passing flatus. She feels her abdomen is distended to the point where a deep breath causes increased abdominal pain. She reports subjective fever, "I feel hot". History of anterior approach lumbar surgery in 2020.   The history is provided by the patient. No language interpreter was used.  Emesis Associated symptoms: abdominal pain and fever (Subjective)   Associated symptoms: no cough        Past Medical History:  Diagnosis Date  . Anxiety   . Asthma    seasonal  . Headache   . Pelvic pain     Patient Active Problem List   Diagnosis Date Noted  . Migraines 01/07/2020  . Low back pain 01/30/2019  . Environmental allergies 12/06/2017  . Asthma 12/06/2017  . Depression, recurrent (HCC) 07/10/2017  . Anxiety and depression 07/10/2017  . Bilateral ovarian cysts 06/13/2017  . 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 ABDOMINAL EXPOSURE N/A 01/30/2019   Procedure: ABDOMINAL EXPOSURE;  Surgeon: Nada Libman, MD;  Location: [facility] OR;  Service: Vascular;  Laterality: N/A;  . ANTERIOR LUMBAR FUSION N/A 01/30/2019   Procedure: Anterior Lumbar Interbody Fusion L5-S1;  Surgeon: Venita Lick, MD;  Location: [facility] OR;  Service: Orthopedics;  Laterality: N/A;   . CERVICAL BIOPSY  W/ LOOP ELECTRODE EXCISION     NOV 2014  . CHOLECYSTECTOMY    . LAPAROSCOPIC SALPINGO OOPHERECTOMY Bilateral 03/16/2017   Procedure: LAPAROSCOPIC SALPINGO OOPHORECTOMY;  Surgeon: Dara Lords, MD;  Location: [facility];  Service: Gynecology;  Laterality: Bilateral;  .  OPEN REDUCTION INTERNAL FIXATION (ORIF) DISTAL RADIAL FRACTURE Right 05/02/2018   Procedure: OPEN REDUCTION INTERNAL FIXATION (ORIF) DISTAL RADIAL FRACTURE;  Surgeon: Bradly Bienenstock, MD;  Location: [facility] OR;  Service: Orthopedics;  Laterality: Right;  . PELVIC LAPAROSCOPY       OB History    Gravida  2   Para      Term      Preterm      AB  2   Living  0     SAB  2   TAB      Ectopic      Multiple      Live Births              Family History  Problem Relation Age of Onset  . Hypertension Mother   . Anxiety disorder Mother   . Depression Mother   . Breast cancer Other        GREAT AUNT  . ADD / ADHD Father     Social History   Tobacco Use  . Smoking status: Never Smoker  . Smokeless tobacco: Never Used  Vaping Use  . Vaping Use: Never used  Substance Use Topics  . Alcohol use: Not Currently    Alcohol/week: 1.0 standard drink    Types: 1 Glasses of wine per week  . Drug use: No    Home Medications Prior to Admission medications   Medication Sig Start Date End Date Taking? Authorizing Provider  albuterol (VENTOLIN HFA) 108 (90 Base)  MCG/ACT inhaler INHALE 2 PUFFS INTO THE LUNGS EVERY 4 (FOUR) HOURS AS NEEDED FOR WHEEZING OR SHORTNESS OF BREATH. 10/10/19   Nelwyn SalisburyFry, Stephen A, MD  ALPRAZolam Prudy Feeler(XANAX) 1 MG tablet Take 1 tablet (1 mg total) by mouth 2 (two) times daily as needed for anxiety. 03/27/20   Corie Chiquitoarter, Jessica, PMHNP  amoxicillin-clavulanate (AUGMENTIN) 875-125 MG tablet Take 1 tablet by mouth 2 (two) times daily. 01/28/20   Nelwyn SalisburyFry, Stephen A, MD  ASHWAGANDHA PO Take by mouth.    [provider]  buPROPion (WELLBUTRIN XL) 150 MG 24 hr tablet Take 2 tablets (300 mg total) by mouth daily. 03/11/20 06/09/20  Corie Chiquitoarter, Jessica, PMHNP  fluconazole (DIFLUCAN) 200 MG tablet Take 1 tablet (200 mg total) by mouth daily. 04/29/20   Nelwyn SalisburyFry, Stephen A, MD  fluticasone [facility](FLONASE) 50 MCG/ACT nasal spray SPRAY 2 SPRAYS INTO EACH NOSTRIL EVERY DAY 05/05/20   Nelwyn SalisburyFry, Stephen A, MD    Fluticasone Propionate, Inhal, (FLOVENT DISKUS) 100 MCG/BLIST AEPB INHALE 1 PUFF INTO THE LUNGS DAILY 11/27/19   Nelwyn SalisburyFry, Stephen A, MD  gabapentin (NEURONTIN) 400 MG capsule TAKE 1 CAPSULE BY MOUTH 3 TIMES A DAY AND 2 CAPSULES AT BEDTIME 03/11/20   Corie Chiquitoarter, Jessica, PMHNP  HYDROcodone-homatropine (HYDROMET) 5-1.5 MG/5ML syrup Take 5 mLs by mouth every 4 (four) hours as needed. 01/28/20   Nelwyn SalisburyFry, Stephen A, MD  hydrOXYzine (ATARAX/VISTARIL) 10 MG tablet Take 1 tablet (10 mg total) by mouth 3 (three) times daily as needed. 03/11/20   Corie Chiquitoarter, Jessica, PMHNP  ibuprofen (ADVIL) 800 MG tablet Take 1 tablet (800 mg total) by mouth every 8 (eight) hours as needed for moderate pain. 01/07/20   Nelwyn SalisburyFry, Stephen A, MD  meloxicam (MOBIC) 15 MG tablet Mobic 15 mg tablet   1 tablet every day by oral route.    [provider]  methylphenidate 18 MG PO CR tablet Take 1 tablet (18 mg total) by mouth daily. 03/11/20 04/10/20  Corie Chiquitoarter, Jessica, PMHNP  methylphenidate 18 MG PO CR tablet Take 1 tablet (18 mg total) by mouth daily. 04/08/20   Corie Chiquitoarter, Jessica, PMHNP  methylphenidate 18 MG PO CR tablet Take 1 tablet (18 mg total) by mouth daily. 05/06/20   Corie Chiquitoarter, Jessica, PMHNP  montelukast (SINGULAIR) 10 MG tablet TAKE 1 TABLET BY MOUTH EVERYDAY AT BEDTIME 11/11/19   Nelwyn SalisburyFry, Stephen A, MD  ondansetron (ZOFRAN) 4 MG tablet Take 1 tablet (4 mg total) by mouth every 8 (eight) hours as needed for nausea or vomiting. 01/30/19   Venita LickBrooks, Dahari, MD  ondansetron (ZOFRAN) 4 MG tablet Zofran 4 mg tablet  Take 2 tablets every 8 hours by oral route as needed for 5 days.  Take 1-2 tabs every 8 hours as needed for nausea    [provider]  rizatriptan (MAXALT-MLT) 10 MG disintegrating tablet Take 1 tablet (10 mg total) by mouth as needed for migraine. May repeat in 2 hours if needed 01/07/20   Nelwyn SalisburyFry, Stephen A, MD  traMADol (ULTRAM) 50 MG tablet Take 50 mg by mouth 3 (three) times daily as needed. 11/23/18   [provider]  valACYclovir  (VALTREX) 500 MG tablet Take 1 tablet (500 mg total) by mouth 2 (two) times daily. 01/07/20   Nelwyn SalisburyFry, Stephen A, MD    Allergies    Patient has no known allergies.  Review of Systems   Review of Systems  Constitutional: Positive for fever (Subjective).  HENT: Negative.   Respiratory: Negative.  Negative for cough and shortness of breath.   Cardiovascular: Negative.  Negative for chest pain.  Gastrointestinal: Positive for abdominal pain, constipation, nausea and vomiting.  Genitourinary: Positive for dysuria.  Musculoskeletal:       C/o low back pain  Skin: Negative.   Neurological: Positive for weakness.    Physical Exam Updated Vital Signs BP 107/69 (BP Location: Left Arm)   Pulse 91   Temp 97.9 F (36.6 C) (Oral)   Resp 16   LMP 02/19/2009 (LMP Unknown)   SpO2 100%   Physical Exam Constitutional:      Appearance: Normal appearance. She is ill-appearing.  HENT:     Head: Normocephalic.  Cardiovascular:     Rate and Rhythm: Normal rate and regular rhythm.     Heart sounds: No murmur heard.   Pulmonary:     Effort: Pulmonary effort is normal.     Breath sounds: No wheezing, rhonchi or rales.  Abdominal:     General: Abdomen is flat.     Tenderness: There is abdominal tenderness (Generalized). There is guarding.  Musculoskeletal:        General: Normal range of motion.     Cervical back: Normal range of motion and neck supple.  Skin:    General: Skin is warm and dry.  Neurological:     Mental Status: She is alert and oriented to person, place, and time.     ED Results / Procedures / Treatments   Labs (all labs ordered are listed, but only abnormal results are displayed) Labs Reviewed  COMPREHENSIVE METABOLIC PANEL - Abnormal; Notable for the following components:      Result Value   CO2 18 (*)    Glucose, Bld 165 (*)    BUN 21 (*)    All other components within normal limits  CBC - Abnormal; Notable for the following components:   WBC 13.8 (*)    All  other components within normal limits  URINALYSIS, ROUTINE W REFLEX MICROSCOPIC - Abnormal; Notable for the following components:   Color, Urine AMBER (*)    Bilirubin Urine SMALL (*)    Ketones, ur 5 (*)    Protein, ur 30 (*)    Leukocytes,Ua TRACE (*)    Bacteria, UA RARE (*)    All other components within normal limits  LIPASE, BLOOD  CK  I-STAT BETA HCG BLOOD, ED ([facility], [facility], [facility] ONLY)   Results for orders placed or performed during the hospital encounter of 05/14/20  Lipase, blood  Result Value Ref Range   Lipase 23 11 - 51 U/L  Comprehensive metabolic panel  Result Value Ref Range   Sodium 137 135 - 145 mmol/L   Potassium 4.1 3.5 - 5.1 mmol/L   Chloride 105 98 - 111 mmol/L   CO2 18 (L) 22 - 32 mmol/L   Glucose, Bld 165 (H) 70 - 99 mg/dL   BUN 21 (H) 6 - 20 mg/dL   Creatinine, Ser 3.01 0.44 - 1.00 mg/dL   Calcium 8.9 8.9 - 60.1 mg/dL   Total Protein 6.6 6.5 - 8.1 g/dL   Albumin 3.9 3.5 - 5.0 g/dL   AST 19 15 - 41 U/L   ALT 13 0 - 44 U/L   Alkaline Phosphatase 85 38 - 126 U/L   Total Bilirubin 0.7 0.3 - 1.2 mg/dL   GFR calc non Af Amer >60 >60 mL/min   GFR calc Af Amer >60 >60 mL/min   Anion gap 14 5 - 15  CBC  Result Value Ref Range   WBC 13.8 (H) 4.0 -  10.5 K/uL   RBC 4.92 3.87 - 5.11 MIL/uL   Hemoglobin 14.7 12.0 - 15.0 g/dL   HCT 31.5 36 - 46 %   MCV 91.1 80.0 - 100.0 fL   MCH 29.9 26.0 - 34.0 pg   MCHC 32.8 30.0 - 36.0 g/dL   RDW 40.0 86.7 - 61.9 %   Platelets 338 150 - 400 K/uL   nRBC 0.0 0.0 - 0.2 %  Urinalysis, Routine w reflex microscopic Urine, Clean Catch  Result Value Ref Range   Color, Urine AMBER (A) YELLOW   APPearance CLEAR CLEAR   Specific Gravity, Urine 1.030 1.005 - 1.030   pH 5.0 5.0 - 8.0   Glucose, UA NEGATIVE NEGATIVE mg/dL   Hgb urine dipstick NEGATIVE NEGATIVE   Bilirubin Urine SMALL (A) NEGATIVE   Ketones, ur 5 (A) NEGATIVE mg/dL   Protein, ur 30 (A) NEGATIVE mg/dL   Nitrite NEGATIVE NEGATIVE   Leukocytes,Ua TRACE (A) NEGATIVE    RBC / HPF 0-5 0 - 5 RBC/hpf   WBC, UA 0-5 0 - 5 WBC/hpf   Bacteria, UA RARE (A) NONE SEEN   Squamous Epithelial / LPF 0-5 0 - 5   Mucus PRESENT    Hyaline Casts, UA PRESENT   CK  Result Value Ref Range   Total CK 32 (L) 38.0 - 234.0 U/L    EKG None  Radiology No results found.  Procedures Procedures (including critical care time)  Medications Ordered in ED Medications  ondansetron (ZOFRAN) injection 4 mg (4 mg Intravenous Given 05/14/20 0543)  HYDROmorphone (DILAUDID) injection 0.5 mg (0.5 mg Intravenous Given 05/14/20 0543)  sodium chloride 0.9 % bolus 1,000 mL (1,000 mLs Intravenous New Bag/Given 05/14/20 0544)    ED Course  I have reviewed the triage vital signs and the nursing notes.  Pertinent labs & imaging results that were available during my care of the patient were reviewed by me and considered in my medical decision making (see chart for details).    MDM Rules/Calculators/A&P                          Patient to ED with ss/sxs as detailed in the HPI.   She is uncomfortable appearing. VSS, mildly hypotensive. IV started for aggressive fluids, pain and nausea medications provided. Will obtain CT scan abd/pel r/o obstruction.   Labs reassuring. Mild leukocytosis of 13.8. CT pending.   Patient care signed out to [facility], PA-C, for recheck of the patient's symptoms, review of CT results, PO challenge if CT negative.   Final Clinical Impression(s) / ED Diagnoses Final diagnoses:  None   1. Abdominal pain 2. Emesis   Rx / DC Orders ED Discharge Orders    None       Elpidio Anis, PA-C 05/14/20 [id number]    Shon Baton, MD 05/14/20 [phone]

## 2020-05-14 NOTE — ED Notes (Signed)
PO fluids given to pt.

## 2020-05-14 NOTE — Telephone Encounter (Signed)
Pt is calling in stating that she was in the hospital is why she missed her appointment this morning and the hospital is kicking her out and would like to speak with provider today.  Pt is aware that the provider is in with pts at this present time and this is his half day and we ask for 24-48 hrs for a return call.

## 2020-05-14 NOTE — ED Triage Notes (Signed)
Pt BIB [facility] from home complaining of nausea, vomiting, and diffuse abdominal pain x 5 hours. Reports constipation for the last 3 days. No diarrhea. Reports a little bit of blood in her vomit. Also reports L flank pain and dark urine. Refused zofran en route. Received of NS en route. 20g LAC.

## 2020-05-15 NOTE — Telephone Encounter (Signed)
ATC, unable to leave a voice mail.  

## 2020-05-19 ENCOUNTER — Other Ambulatory Visit: Payer: Self-pay

## 2020-05-19 ENCOUNTER — Encounter: Payer: Self-pay | Admitting: Family Medicine

## 2020-05-19 ENCOUNTER — Ambulatory Visit (INDEPENDENT_AMBULATORY_CARE_PROVIDER_SITE_OTHER): Payer: 59 | Admitting: Family Medicine

## 2020-05-19 VITALS — BP 118/60 | HR 96 | Temp 98.0°F | Wt 117.8 lb

## 2020-05-19 DIAGNOSIS — R350 Frequency of micturition: Secondary | ICD-10-CM

## 2020-05-19 DIAGNOSIS — N390 Urinary tract infection, site not specified: Secondary | ICD-10-CM

## 2020-05-19 LAB — POCT URINALYSIS DIPSTICK
Bilirubin, UA: POSITIVE
Blood, UA: NEGATIVE
Glucose, UA: POSITIVE — AB
Ketones, UA: NEGATIVE
Nitrite, UA: POSITIVE
Protein, UA: NEGATIVE
Spec Grav, UA: 1.01 (ref 1.010–1.025)
Urobilinogen, UA: 2 U/dL — AB
pH, UA: 7 (ref 5.0–8.0)

## 2020-05-19 MED ORDER — CIPROFLOXACIN HCL 500 MG PO TABS: 500.0000 mg | ORAL_TABLET | Freq: Two times a day (BID) | ORAL | 0 refills | Status: DC

## 2020-05-19 MED ORDER — TEMAZEPAM 15 MG PO CAPS: 15.0000 mg | ORAL_CAPSULE | Freq: Every evening | ORAL | 0 refills | Status: DC | PRN

## 2020-05-19 NOTE — Addendum Note (Signed)
Addended by: Allycia Pitz. M on: 05/19/2020 03:43 PM   Modules accepted: Orders  

## 2020-05-19 NOTE — Telephone Encounter (Signed)
ATC, unable to leave a message Unable to reach the patient. Mychart message sent and message will be closed.

## 2020-05-19 NOTE — Addendum Note (Signed)
Addended by: Lerry Liner on: 05/19/2020 03:43 PM   Modules accepted: Orders

## 2020-05-19 NOTE — Progress Notes (Signed)
   Subjective:    Patient ID: [name], female    DOB: 1965-12-01, 54 y.o.   MRN: [id number]  HPI Here to follow up an ER visit on 05-14-20 for abdominal pain and vomiting. The workup, including labs and a CT scan, revealed a UTI. No culture was done. She was given Keflex, which she has taken for 5 days. She still has burning and urgency to urlnate, though the pain and vomiting have resolved. Drinking lots of fluids.    Review of Systems  Constitutional: Negative.   Respiratory: Negative.   Cardiovascular: Negative.   Gastrointestinal: Negative.   Genitourinary: Positive for dysuria, frequency and urgency. Negative for flank pain and hematuria.       Objective:   Physical Exam Constitutional:      Appearance: Normal appearance. She is not ill-appearing.  Cardiovascular:     Rate and Rhythm: Normal rate and regular rhythm.     Pulses: Normal pulses.     Heart sounds: Normal heart sounds.  Pulmonary:     Effort: Pulmonary effort is normal.     Breath sounds: Normal breath sounds.  Abdominal:     General: Abdomen is flat. Bowel sounds are normal. There is no distension.     Palpations: Abdomen is soft. There is no mass.     Tenderness: There is no abdominal tenderness. There is no guarding or rebound.     Hernia: No hernia is present.  Neurological:     Mental Status: She is alert.           Assessment & Plan:  Partially treated UTI. Switch to Cipro for 10 days. Culture the sample.  Gershon Crane, MD

## 2020-05-20 ENCOUNTER — Ambulatory Visit (INDEPENDENT_AMBULATORY_CARE_PROVIDER_SITE_OTHER): Payer: 59 | Admitting: Mental Health

## 2020-05-20 ENCOUNTER — Other Ambulatory Visit: Payer: Self-pay

## 2020-05-20 DIAGNOSIS — F902 Attention-deficit hyperactivity disorder, combined type: Secondary | ICD-10-CM

## 2020-05-20 DIAGNOSIS — F419 Anxiety disorder, unspecified: Secondary | ICD-10-CM

## 2020-05-20 LAB — URINE CULTURE
MICRO NUMBER:: 10865060
Result:: NO GROWTH
SPECIMEN QUALITY:: ADEQUATE

## 2020-05-20 NOTE — Telephone Encounter (Signed)
I do not think the urobilinogen level means anything is wrong with her liver. The UTI has likely caused this. We will recheck as scheduled
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
## 2020-05-23 ENCOUNTER — Other Ambulatory Visit: Payer: Self-pay | Admitting: Family Medicine

## 2020-05-27 ENCOUNTER — Ambulatory Visit (INDEPENDENT_AMBULATORY_CARE_PROVIDER_SITE_OTHER): Payer: 59 | Admitting: Mental Health

## 2020-05-27 ENCOUNTER — Ambulatory Visit: Payer: 59 | Admitting: Psychiatry

## 2020-05-27 ENCOUNTER — Encounter: Payer: Self-pay | Admitting: Psychiatry

## 2020-05-27 ENCOUNTER — Other Ambulatory Visit: Payer: Self-pay

## 2020-05-27 ENCOUNTER — Ambulatory Visit (INDEPENDENT_AMBULATORY_CARE_PROVIDER_SITE_OTHER): Payer: 59 | Admitting: Psychiatry

## 2020-05-27 VITALS — Wt 120.0 lb

## 2020-05-27 DIAGNOSIS — F331 Major depressive disorder, recurrent, moderate: Secondary | ICD-10-CM | POA: Diagnosis not present

## 2020-05-27 DIAGNOSIS — F902 Attention-deficit hyperactivity disorder, combined type: Secondary | ICD-10-CM

## 2020-05-27 DIAGNOSIS — F419 Anxiety disorder, unspecified: Secondary | ICD-10-CM

## 2020-05-27 MED ORDER — HYDROXYZINE HCL 10 MG PO TABS: 10.0000 mg | ORAL_TABLET | Freq: Three times a day (TID) | ORAL | 0 refills | Status: DC | PRN

## 2020-05-27 MED ORDER — LISDEXAMFETAMINE DIMESYLATE 10 MG PO CAPS: 10.0000 mg | ORAL_CAPSULE | Freq: Every day | ORAL | 0 refills | Status: DC

## 2020-05-27 MED ORDER — BUPROPION HCL ER (XL) 150 MG PO TB24: 300.0000 mg | ORAL_TABLET | Freq: Every day | ORAL | 0 refills | Status: DC

## 2020-05-27 NOTE — Progress Notes (Signed)
[name] [id number] [birth year] 54 y.o.  Subjective:   Patient ID:  [name] is a 54 y.o. (DOB [birth year]) female.  Chief Complaint:  Chief Complaint  Patient presents with   ADD   Follow-up    Depression, Anxiety    HPI STORY CONTI presents to the office today for follow-up of ADD, Depression, and Anxiety. She reports that she has difficulty when Concerta wears off. She reports that Concerta 27 mg interferes with her sleep and that 18 mg is partially effective. She reports that concentration "could be better" but is unable to tolerate higher doses of Concerta. She reports that her depression has been well controlled. Anxiety has been manageable. Sleeping ok. Appetite has been normal. Energy and motivation have been good. Denies anhedonia. Denies SI.   Reports that she weaned herself off Gabapentin and has not noticed any worsening pain or anxiety. She reports that PCP started her on Temazepam to possibly be able to reduce Alprazolam.   Has been taking care of 2 aging parents and trying to find a job.   Past Psychiatric Medication Trials: Gabapentin- prescribed for nerve pain. Needs 600 mg to help weightwith insomnia. Has been helpful for anxiety and pain.  Cymbalta- Gained 20 lbs in 3 months. Had severe discontinuation s/s.  Zoloft- Felt groggy and "spacey." Sexual side effects.  Prozac- felt groggy and spacey. "All I wanted to do was cry." Wellbutrin XL- Has been helpful for depression and has taken it periodically. Took 150 mg po qd. Last took around 2018.  Wellbutrin SR- Felt that 100 mg was ineffective. Alprazolam- Taken prn for 6 years. Diazepam Trazodone- jitteriness. Paradoxical effect. Temazepam Hydroxyzine- effective for anxiety  PHQ2-9     Telemedicine from 07/04/2019 in [facility] at [employer] from 06/13/2017 in [facility] at [employer] Total Score 3 0       Review of Systems:  Review of  Systems  Cardiovascular: Negative for palpitations.  Musculoskeletal: Negative for gait problem.  Neurological: Negative for tremors.  Psychiatric/Behavioral:       Please refer to HPI    Medications: I have reviewed the patient's current medications.  Current Outpatient Medications  Medication Sig Dispense Refill   ALPRAZolam (XANAX) 1 MG tablet Take 1 tablet (1 mg total) by mouth 2 (two) times daily as needed for anxiety. 60 tablet 2   buPROPion (WELLBUTRIN XL) 150 MG 24 hr tablet Take 2 tablets (300 mg total) by mouth daily. 180 tablet 0   ciprofloxacin (CIPRO) 500 MG tablet Take 1 tablet (500 mg total) by mouth 2 (two) times daily. 20 tablet 0   Fluticasone Propionate, Inhal, (FLOVENT DISKUS) 100 MCG/BLIST AEPB INHALE 1 PUFF INTO THE LUNGS DAILY 180 each 1   hydrOXYzine (ATARAX/VISTARIL) 10 MG tablet Take 1 tablet (10 mg total) by mouth 3 (three) times daily as needed. 270 tablet 0   ibuprofen (ADVIL) 800 MG tablet Take 1 tablet (800 mg total) by mouth every 8 (eight) hours as needed for moderate pain. 60 tablet 5   methylphenidate 18 MG PO CR tablet Take 1 tablet (18 mg total) by mouth daily. 30 tablet 0   methylphenidate 18 MG PO CR tablet Take 1 tablet (18 mg total) by mouth daily. 30 tablet 0   montelukast (SINGULAIR) 10 MG tablet TAKE 1 TABLET BY MOUTH EVERYDAY AT BEDTIME 90 tablet 3   temazepam (RESTORIL) 15 MG capsule Take 1 capsule (15 mg total) by mouth at bedtime as needed for  sleep. 30 capsule 0   valACYclovir (VALTREX) 500 MG tablet Take 1 tablet (500 mg total) by mouth 2 (two) times daily. 180 tablet 3   albuterol (VENTOLIN HFA) 108 (90 Base) MCG/ACT inhaler INHALE 2 PUFFS INTO THE LUNGS EVERY 4 (FOUR) HOURS AS NEEDED FOR WHEEZING OR SHORTNESS OF BREATH. 18 g 5   fluconazole (DIFLUCAN) 200 MG tablet TAKE 1 TABLET BY MOUTH EVERY DAY (Patient not taking: Reported on 05/27/2020) 5 tablet 2   fluticasone (FLONASE) 50 MCG/ACT nasal spray SPRAY 2 SPRAYS INTO EACH  NOSTRIL EVERY DAY 48 mL 2   lisdexamfetamine (VYVANSE) 10 MG capsule Take 1 capsule (10 mg total) by mouth daily. 30 capsule 0   meloxicam (MOBIC) 15 MG tablet Mobic 15 mg tablet   1 tablet every day by oral route. (Patient not taking: Reported on 05/27/2020)     methylphenidate 18 MG PO CR tablet Take 1 tablet (18 mg total) by mouth daily. 30 tablet 0   ondansetron (ZOFRAN) 4 MG tablet Take 1 tablet (4 mg total) by mouth every 8 (eight) hours as needed for nausea or vomiting. 20 tablet 0   ondansetron (ZOFRAN) 4 MG tablet Zofran 4 mg tablet  Take 2 tablets every 8 hours by oral route as needed for 5 days.  Take 1-2 tabs every 8 hours as needed for nausea     phenazopyridine (PYRIDIUM) 200 MG tablet Take 1 tablet (200 mg total) by mouth 3 (three) times daily. (Patient not taking: Reported on 05/27/2020) 6 tablet 0   promethazine (PHENERGAN) 25 MG tablet Take 1 tablet (25 mg total) by mouth every 6 (six) hours as needed for nausea or vomiting. (Patient not taking: Reported on 05/27/2020) 30 tablet 0   rizatriptan (MAXALT-MLT) 10 MG disintegrating tablet Take 1 tablet (10 mg total) by mouth as needed for migraine. May repeat in 2 hours if needed 30 tablet 3   traMADol (ULTRAM) 50 MG tablet Take 50 mg by mouth 3 (three) times daily as needed. (Patient not taking: Reported on 05/27/2020)     No current facility-administered medications for this visit.    Medication Side Effects: Other: insomnia  Allergies: No Known Allergies  Past Medical History:  Diagnosis Date   Anxiety    Asthma    seasonal   Headache    Pelvic pain     Family History  Problem Relation Age of Onset   Hypertension Mother    Anxiety disorder Mother    Depression Mother    Breast cancer Other        GRE[employer]UNT   ADD / ADHD Father     Social History   Socioeconomic History   Marital status: Divorced    Spouse name: Not on file   Number of children: Not on file   Years of education: Not on file    Highest education level: Not on file  Occupational History   Not on file  Tobacco Use   Smoking status: Never Smoker   Smokeless tobacco: Never Used  Vaping Use   Vaping Use: Never used  Substance and Sexual Activity   Alcohol use: Not Currently    Alcohol/week: 1.0 standard drink    Types: 1 Glasses of wine per week   Drug use: No   Sexual activity: Not Currently  Other Topics Concern   Not on file  Social History Narrative   Not on file   Social Determinants of Health   Financial Resource Strain:    Difficulty  of Paying Living Expenses: Not on file  Food Insecurity:    Worried About Running Out of Food in the Last Year: Not on file   Ran Out of Food in the Last Year: Not on file  Transportation Needs:    Lack of Transportation (Medical): Not on file   Lack of Transportation (Non-Medical): Not on file  Physical Activity:    Days of Exercise per Week: Not on file   Minutes of Exercise per Session: Not on file  Stress:    Feeling of Stress : Not on file  Social Connections:    Frequency of Communication with Friends and Family: Not on file   Frequency of Social Gatherings with Friends and Family: Not on file   Attends Religious Services: Not on file   Active Member of Clubs or Organizations: Not on file   Attends Banker Meetings: Not on file   Marital Status: Not on file  Intimate Partner Violence:    Fear of Current or Ex-Partner: Not on file   Emotionally Abused: Not on file   Physically Abused: Not on file   Sexually Abused: Not on file    Past Medical History, Surgical history, Social history, and Family history were reviewed and updated as appropriate.   Please see review of systems for further details on the patient's review from today.   Objective:   Physical Exam:  Wt 120 lb (54.4 kg)    LMP 02/19/2009 (LMP Unknown)    BMI 18.79 kg/m   Physical Exam Constitutional:      General: She is not in acute  distress. Musculoskeletal:        General: No deformity.  Neurological:     Mental Status: She is alert and oriented to person, place, and time.     Coordination: Coordination normal.  Psychiatric:        Attention and Perception: Attention and perception normal. She does not perceive auditory or visual hallucinations.        Mood and Affect: Mood normal. Mood is not anxious or depressed. Affect is not labile, blunt, angry or inappropriate.        Speech: Speech normal.        Behavior: Behavior normal.        Thought Content: Thought content normal. Thought content is not paranoid or delusional. Thought content does not include homicidal or suicidal ideation. Thought content does not include homicidal or suicidal plan.        Cognition and Memory: Cognition and memory normal.        Judgment: Judgment normal.     Comments: Insight intact     Lab Review:     Component Value Date/Time   NA 137 05/14/2020 0442   K 4.1 05/14/2020 0442   CL 105 05/14/2020 0442   CO2 18 (L) 05/14/2020 0442   GLUCOSE 165 (H) 05/14/2020 0442   BUN 21 (H) 05/14/2020 0442   CREATININE 0.91 05/14/2020 0442   CREATININE 0.83 03/13/2017 1211   CALCIUM 8.9 05/14/2020 0442   PROT 6.6 05/14/2020 0442   ALBUMIN 3.9 05/14/2020 0442   AST 19 05/14/2020 0442   ALT 13 05/14/2020 0442   ALKPHOS 85 05/14/2020 0442   BILITOT 0.7 05/14/2020 0442   GFRNONAA >60 05/14/2020 0442   GFRAA >60 05/14/2020 0442       Component Value Date/Time   WBC 13.8 (H) 05/14/2020 0442   RBC 4.92 05/14/2020 0442   HGB 14.7 05/14/2020 0442   HCT  44.8 05/14/2020 0442   PLT 338 05/14/2020 0442   MCV 91.1 05/14/2020 0442   MCH 29.9 05/14/2020 0442   MCHC 32.8 05/14/2020 0442   RDW 13.1 05/14/2020 0442   LYMPHSABS 1.3 03/15/2018 0846   MONOABS 0.5 03/15/2018 0846   EOSABS 0.1 03/15/2018 0846   BASOSABS 0.0 03/15/2018 0846    No results found for: POCLITH, LITHIUM   No results found for: PHENYTOIN, PHENOBARB, VALPROATE, CBMZ    .res Assessment: Plan:   Discussed potential benefits, risks, and side effects of Vyvanse.  Patient agrees to trial of Vyvanse.  Will discontinue Concerta due to side effects. Will continue Wellbutrin XL for depression. Continue hydroxyzine as needed for anxiety. Recommend continuing psychotherapy with Elio Forget, [facility]. Patient to follow-up with this provider in 4 weeks or sooner if clinically indicated. Patient advised to contact office with any questions, adverse effects, or acute worsening in signs and symptoms.  Reegan was seen today for add and follow-up.  Diagnoses and all orders for this visit:  Attention deficit hyperactivity disorder (ADHD), combined type -     lisdexamfetamine (VYVANSE) 10 MG capsule; Take 1 capsule (10 mg total) by mouth daily.  Moderate recurrent major depression (HCC) -     buPROPion (WELLBUTRIN XL) 150 MG 24 hr tablet; Take 2 tablets (300 mg total) by mouth daily.  Anxiety -     hydrOXYzine (ATARAX/VISTARIL) 10 MG tablet; Take 1 tablet (10 mg total) by mouth 3 (three) times daily as needed.     Please see After Visit Summary for patient specific instructions.  Future Appointments  Date Time Provider Department Center  06/03/2020 10:00 AM Waldron Session, [facility] None  06/23/2020 10:00 AM Waldron Session, [facility] None  06/24/2020  9:00 AM Corie Chiquito, PMHNP CP-CP None  06/24/2020  9:45 AM Nelwyn Salisbury, MD [facility]    No orders of the defined types were placed in this encounter.   -------------------------------

## 2020-05-28 NOTE — Progress Notes (Signed)
[facility] Counselor/Therapist Progress Note   Patient ID: [name], MRN: [id number]  Date: 05/27/20  Timespent: 55 minutes  Treatment Type: Individual therapy  Mental Status Exam:   Appearance:   Casual     Behavior:  Appropriate  Motor:  Normal  Speech/Language:   Normal Rate, rhythm   Affect:  Full Range  Mood:  anxious, pleasant   Thought process:  Coherent and Relevant  Thought content:    Logical, goal directed  Perceptual disturbances:    none  Orientation:  Full (Time, Place, and Person)  Attention:  Good  Concentration:  good  Memory:  intact  Fund of knowledge:   Good  Insight:    Good  Judgment:   Good  Impulse Control:  good    Reported Symptoms:  Depressed mood, anxiety most days, rumination, fatigue, anhedonia, some sleep disturbances  Risk Assessment: Danger to Self:  No Self-injurious Behavior: No Danger to Others: No Duty to Warn:no Physical Aggression / Violence:No  Access to Firearms a concern: No  Gang Involvement:No   Patient / guardian was educated about steps to take if suicide or homicide risk level increases between visits. While future psychiatric events cannot be accurately predicted, the patient does not currently require acute inpatient psychiatric care and does not currently meet [facility] involuntary commitment criteria.  Subjective:  Patient presents for session in no distress.  She shared how she and her mother have had some improvements with her relationship over the past week.  Her mother has returned home following her medical event and is improving.  She stated her mother has been "nicer" recently and this is made it easier to be a support to her.  She continues to consider moving to another city with job prospects and she stated that she has had some recent interviews.  She continues to discuss steps toward making this move, considering transferring with her current apartment complex to a sister property in  that area.  She continues to downsize currently in preparation for the move. She continues to identify thoughts and feelings related to making this needed change for various reasons which include having more of a boundary between her and her mother as the relationship is typically distressful to the patient often.  She shared how expectations will have to change, her brother will have to be even more involved when she makes the marriage.  She says she continues to be supportive of her father scheduled for a biopsy with which she plans to be supportive by going down to be with him during the procedure.  She questions some of her decisions about making the move however, continues to identify reasons to make this change.  Provide support and understanding throughout encouraged her to continue to evaluate, give herself time to make these decisions as they are significant.  Interventions: CBT, supportive therapy  Diagnosis:   ICD-10-CM   1. Anxiety  F41.9   2. Attention deficit hyperactivity disorder (ADHD), combined type  F90.2      Plan:  Patient is to apply thought blocking skill to decrease her anxiety about her upcoming medical appointment for her nerve block.  Patient to continue to advocate for her needs related to medical and in relationships.  Patient to be intentional with identifying thoughts about how she is taking steps toward self-care and improving her situation date today.  Long-term goal:   Identify the major life complexities from the past and present that  form the basis for present feelings of anxiety and depression. Short-term goal:  Enhance ability to handle effectively maintain self care while coping with pain associated with medical conditions.            Positive, realistic and idealistic self talk to keep feelings of anxiety and depression more infrequent.   Assessment of progress:  progressing   Waldron Session, [facility]

## 2020-06-03 ENCOUNTER — Ambulatory Visit: Payer: 59 | Admitting: Mental Health

## 2020-06-03 ENCOUNTER — Ambulatory Visit: Payer: 59 | Admitting: Family Medicine

## 2020-06-10 ENCOUNTER — Encounter: Payer: Self-pay | Admitting: Family Medicine

## 2020-06-10 ENCOUNTER — Other Ambulatory Visit: Payer: Self-pay | Admitting: Family Medicine

## 2020-06-10 ENCOUNTER — Other Ambulatory Visit: Payer: Self-pay

## 2020-06-10 MED ORDER — TEMAZEPAM 15 MG PO CAPS: 15.0000 mg | ORAL_CAPSULE | Freq: Every evening | ORAL | 5 refills | Status: DC | PRN

## 2020-06-10 NOTE — Progress Notes (Signed)
Created in error

## 2020-06-19 ENCOUNTER — Other Ambulatory Visit: Payer: Self-pay | Admitting: Family Medicine

## 2020-06-19 NOTE — Telephone Encounter (Signed)
Alert for ibuprofen and meloxicam

## 2020-06-23 ENCOUNTER — Other Ambulatory Visit: Payer: Self-pay

## 2020-06-23 ENCOUNTER — Ambulatory Visit (INDEPENDENT_AMBULATORY_CARE_PROVIDER_SITE_OTHER): Payer: 59 | Admitting: Mental Health

## 2020-06-23 DIAGNOSIS — F902 Attention-deficit hyperactivity disorder, combined type: Secondary | ICD-10-CM | POA: Diagnosis not present

## 2020-06-23 NOTE — Progress Notes (Signed)
         [facility] Counselor/Therapist Progress Note   Patient ID: [name], MRN: [id number]  Date: 06/23/20  Timespent: 55 minutes  Treatment Type: Individual therapy  Mental Status Exam:   Appearance:   Casual     Behavior:  Appropriate  Motor:  Normal  Speech/Language:   Normal Rate, rhythm   Affect:  Full Range  Mood:  anxious, pleasant   Thought process:  Coherent and Relevant  Thought content:    Logical, goal directed  Perceptual disturbances:    none  Orientation:  Full (Time, Place, and Person)  Attention:  Good  Concentration:  good  Memory:  intact  Fund of knowledge:   Good  Insight:    Good  Judgment:   Good  Impulse Control:  good    Reported Symptoms:  Depressed mood, anxiety most days, rumination, fatigue, anhedonia, some sleep disturbances  Risk Assessment: Danger to Self:  No Self-injurious Behavior: No Danger to Others: No Duty to Warn:no Physical Aggression / Violence:No  Access to Firearms a concern: No  Gang Involvement:No   Patient / guardian was educated about steps to take if suicide or homicide risk level increases between visits. While future psychiatric events cannot be accurately predicted, the patient does not currently require acute inpatient psychiatric care and does not currently meet [facility] involuntary commitment criteria.  Subjective:  Patient presents for session, sharing progress.  She shared recent interactions with her mother and she has been helping her with transportation since her recent medical event.  She stated that her mother is not to drive for the next month.  Patient shared how she has had to set some boundaries with some family members who she stated have been critical of her due to her mother's ongoing history of making complaints about patient.  She continues to consider moving to another city a few hours away, is considering options recently was able to see a friend from the past who lives there and  she is considering possibly moving in with her.  She identifies the need to have space from her mother and this move would be another way patient wants to set boundaries needed for herself.  She is applied for a job one of the [employer], tries to remain hopeful but also realizes that it will be competitive.  She identifies feeling more hopeful and optimistic about her future, able to identify needs such as potentially moving, and eventually returning to work which she identified as an important component of defining her purpose.  Interventions: CBT, supportive therapy  Diagnosis:   ICD-10-CM   1. Attention deficit hyperactivity disorder (ADHD), combined type  F90.2      Plan:  Patient is to apply thought blocking skill to decrease her anxiety about her upcoming medical appointment for her nerve block.  Patient to continue to advocate for her needs related to medical and in relationships.  Continue to explore options regarding her potential move and eventually employment.  Long-term goal:   Identify the major life complexities from the past and present that form the basis for present feelings of anxiety and depression. Short-term goal:  Enhance ability to handle effectively maintain self care while coping with pain associated with medical conditions.            Positive, realistic and idealistic self talk to keep feelings of anxiety and depression more infrequent.   Assessment of progress:  progressing   Waldron Session, [facility]

## 2020-06-24 ENCOUNTER — Encounter: Payer: 59 | Admitting: Family Medicine

## 2020-06-24 ENCOUNTER — Encounter: Payer: Self-pay | Admitting: Psychiatry

## 2020-06-24 ENCOUNTER — Ambulatory Visit (INDEPENDENT_AMBULATORY_CARE_PROVIDER_SITE_OTHER): Payer: 59 | Admitting: Psychiatry

## 2020-06-24 DIAGNOSIS — F902 Attention-deficit hyperactivity disorder, combined type: Secondary | ICD-10-CM

## 2020-06-24 DIAGNOSIS — F419 Anxiety disorder, unspecified: Secondary | ICD-10-CM | POA: Diagnosis not present

## 2020-06-24 DIAGNOSIS — G4700 Insomnia, unspecified: Secondary | ICD-10-CM | POA: Diagnosis not present

## 2020-06-24 MED ORDER — HYDROXYZINE HCL 25 MG PO TABS: 25.0000 mg | ORAL_TABLET | Freq: Four times a day (QID) | ORAL | 1 refills | Status: DC | PRN

## 2020-06-24 NOTE — Progress Notes (Signed)
[name] [id number] November 05, 1965 54 y.o.  Subjective:   Patient ID:  [name] is a 54 y.o. (DOB Jun 16, 1966) female.  Chief Complaint:  Chief Complaint  Patient presents with  . Medication Problem    Perceptual disturbances  . Follow-up    Anxiety, Depression, ADD, insomnia    HPI [name] presents to the office today for follow-up of anxiety, depression, and ADD. She reports that she is no longer taking Alprazolam and is continuing to take Temazepam. She reports she was having some VH that started in the summer. She has had some issues with insects in her apartment. She reports that she then started thinking that she was seeing light blue insects and felt as if insects were crawling on her face. Denies any other hallucinations or delusions. She reports that this coincided with decreasing/stopping Gabapentin and that this improved with re-starting Gabapentin on Sunday. She reports that her anxiety immediately improved after taking Gabapentin. She reports that her sleep is disrupted when she takes Gabapentin. She reports that Temazepam is helpful for sleep initiation and then wears off around 2-3 am and is able to return to sleep. She reports that her sleep has been "erratic" over the past several days with re-starting Gabapentin. She reports that her appetite has been good.  Depression seems to be wel controlled. She reports anxiety in response to perceptual disturbances and otherwise anxiety has been well controlled. She reports that Vyvanse seems to be more effective for her ADD s/s. Concentration improved with Vyvanse. Appetite has been stable. Energy and motivation have been good. Denies SI.   She reports that she recently took Hydroxyzine 25 mg po qd prn and this seemed to be more effective than the 10 mg tab.   Past Psychiatric Medication Trials: Gabapentin- prescribed for nerve pain. Needs 600 mg to helpweightwith insomnia. Has been helpful for anxiety  and pain.  Cymbalta- Gained 20 lbs in 3 months. Had severe discontinuation s/s.  Zoloft- Felt groggy and "spacey." Sexual side effects.  Prozac- felt groggy and spacey. "All I wanted to do was cry." Wellbutrin XL- Has been helpful for depression and has taken it periodically. Took 150 mg po qd. Last took around 2018.  Wellbutrin SR- Felt that 100 mg was ineffective. Alprazolam- Taken prn for 6 years. Diazepam Trazodone- jitteriness. Paradoxical effect. Temazepam Hydroxyzine- effective for anxiety  PHQ2-9     Telemedicine from 07/04/2019 in [facility] at [employer] from 06/13/2017 in [facility] at [employer] Total Score 3 0       Review of Systems:  Review of Systems  Cardiovascular: Negative for palpitations.  Musculoskeletal: Negative for gait problem.  Neurological: Negative for tremors.  Psychiatric/Behavioral:       Please refer to HPI    Medications: I have reviewed the patient's current medications.  Current Outpatient Medications  Medication Sig Dispense Refill  . buPROPion (WELLBUTRIN XL) 150 MG 24 hr tablet Take 2 tablets (300 mg total) by mouth daily. (Patient taking differently: Take 150 mg by mouth daily. ) 180 tablet 0  . fluticasone (FLONASE) 50 MCG/ACT nasal spray SPRAY 2 SPRAYS INTO EACH NOSTRIL EVERY DAY 48 mL 2  . Fluticasone Propionate, Inhal, (FLOVENT DISKUS) 100 MCG/BLIST AEPB INHALE 1 PUFF INTO THE LUNGS DAILY 180 each 1  . hydrOXYzine (ATARAX/VISTARIL) 25 MG tablet Take 1 tablet (25 mg total) by mouth every 6 (six) hours as needed. 180 tablet 1  . lisdexamfetamine (VYVANSE) 10 MG capsule Take 1  capsule (10 mg total) by mouth daily. 30 capsule 0  . montelukast (SINGULAIR) 10 MG tablet TAKE 1 TABLET BY MOUTH EVERYDAY AT BEDTIME 90 tablet 3  . ondansetron (ZOFRAN) 4 MG tablet Take 1 tablet (4 mg total) by mouth every 8 (eight) hours as needed for nausea or vomiting. 20 tablet 0  . ondansetron (ZOFRAN) 4 MG tablet Zofran 4 mg  tablet  Take 2 tablets every 8 hours by oral route as needed for 5 days.  Take 1-2 tabs every 8 hours as needed for nausea    . rizatriptan (MAXALT-MLT) 10 MG disintegrating tablet Take 1 tablet (10 mg total) by mouth as needed for migraine. May repeat in 2 hours if needed 30 tablet 3  . temazepam (RESTORIL) 15 MG capsule Take 1 capsule (15 mg total) by mouth at bedtime as needed for sleep. 30 capsule 5  . valACYclovir (VALTREX) 500 MG tablet Take 1 tablet (500 mg total) by mouth 2 (two) times daily. 180 tablet 3  . albuterol (VENTOLIN HFA) 108 (90 Base) MCG/ACT inhaler INHALE 2 PUFFS INTO THE LUNGS EVERY 4 (FOUR) HOURS AS NEEDED FOR WHEEZING OR SHORTNESS OF BREATH. 18 g 5  . ALPRAZolam (XANAX) 1 MG tablet Take 1 tablet (1 mg total) by mouth 2 (two) times daily as needed for anxiety. (Patient not taking: Reported on 06/24/2020) 60 tablet 2  . ciprofloxacin (CIPRO) 500 MG tablet Take 1 tablet (500 mg total) by mouth 2 (two) times daily. (Patient not taking: Reported on 06/24/2020) 20 tablet 0  . fluconazole (DIFLUCAN) 200 MG tablet TAKE 1 TABLET BY MOUTH EVERY DAY (Patient not taking: Reported on 05/27/2020) 5 tablet 2  . ibuprofen (ADVIL) 800 MG tablet TAKE 1 TABLET (800 MG TOTAL) BY MOUTH EVERY 8 (EIGHT) HOURS AS NEEDED FOR MODERATE PAIN. 90 tablet 3  . meloxicam (MOBIC) 15 MG tablet Mobic 15 mg tablet   1 tablet every day by oral route. (Patient not taking: Reported on 05/27/2020)    . phenazopyridine (PYRIDIUM) 200 MG tablet Take 1 tablet (200 mg total) by mouth 3 (three) times daily. (Patient not taking: Reported on 05/27/2020) 6 tablet 0  . promethazine (PHENERGAN) 25 MG tablet Take 1 tablet (25 mg total) by mouth every 6 (six) hours as needed for nausea or vomiting. (Patient not taking: Reported on 05/27/2020) 30 tablet 0  . traMADol (ULTRAM) 50 MG tablet Take 50 mg by mouth 3 (three) times daily as needed. (Patient not taking: Reported on 05/27/2020)     No current facility-administered medications for  this visit.    Medication Side Effects: Other: perceptual disturbances  Allergies: No Known Allergies  Past Medical History:  Diagnosis Date  . Anxiety   . Asthma    seasonal  . Headache   . Pelvic pain     Family History  Problem Relation Age of Onset  . Hypertension Mother   . Anxiety disorder Mother   . Depression Mother   . Breast cancer Other        GREAT AUNT  . ADD / ADHD Father     Social History   Socioeconomic History  . Marital status: Divorced    Spouse name: Not on file  . Number of children: Not on file  . Years of education: Not on file  . Highest education level: Not on file  Occupational History  . Not on file  Tobacco Use  . Smoking status: Never Smoker  . Smokeless tobacco: Never Used  Vaping Use  .  Vaping Use: Never used  Substance and Sexual Activity  . Alcohol use: Not Currently    Alcohol/week: 1.0 standard drink    Types: 1 Glasses of wine per week  . Drug use: No  . Sexual activity: Not Currently  Other Topics Concern  . Not on file  Social History Narrative  . Not on file   Social Determinants of Health   Financial Resource Strain:   . Difficulty of Paying Living Expenses: Not on file  Food Insecurity:   . Worried About Programme researcher, broadcasting/film/video in the Last Year: Not on file  . Ran Out of Food in the Last Year: Not on file  Transportation Needs:   . Lack of Transportation (Medical): Not on file  . Lack of Transportation (Non-Medical): Not on file  Physical Activity:   . Days of Exercise per Week: Not on file  . Minutes of Exercise per Session: Not on file  Stress:   . Feeling of Stress : Not on file  Social Connections:   . Frequency of Communication with Friends and Family: Not on file  . Frequency of Social Gatherings with Friends and Family: Not on file  . Attends Religious Services: Not on file  . Active Member of Clubs or Organizations: Not on file  . Attends Banker Meetings: Not on file  . Marital Status:  Not on file  Intimate Partner Violence:   . Fear of Current or Ex-Partner: Not on file  . Emotionally Abused: Not on file  . Physically Abused: Not on file  . Sexually Abused: Not on file    Past Medical History, Surgical history, Social history, and Family history were reviewed and updated as appropriate.   Please see review of systems for further details on the patient's review from today.   Objective:   Physical Exam:  LMP 02/19/2009 (LMP Unknown)   Physical Exam Constitutional:      General: She is not in acute distress. Musculoskeletal:        General: No deformity.  Neurological:     Mental Status: She is alert and oriented to person, place, and time.     Coordination: Coordination normal.  Psychiatric:        Attention and Perception: Attention and perception normal. She does not perceive auditory or visual hallucinations.        Mood and Affect: Mood normal. Mood is not anxious or depressed. Affect is not labile, blunt, angry or inappropriate.        Speech: Speech normal.        Behavior: Behavior normal.        Thought Content: Thought content is delusional. Thought content is not paranoid. Thought content does not include homicidal or suicidal ideation. Thought content does not include homicidal or suicidal plan.        Cognition and Memory: Cognition and memory normal.        Judgment: Judgment normal.     Comments: Insight intact Pt reports that she has had recent beliefs that her home is infested with small insects and that she can feel bugs crawling on her.      Lab Review:     Component Value Date/Time   NA 137 05/14/2020 0442   K 4.1 05/14/2020 0442   CL 105 05/14/2020 0442   CO2 18 (L) 05/14/2020 0442   GLUCOSE 165 (H) 05/14/2020 0442   BUN 21 (H) 05/14/2020 0442   CREATININE 0.91 05/14/2020 0442   CREATININE 0.83  03/13/2017 1211   CALCIUM 8.9 05/14/2020 0442   PROT 6.6 05/14/2020 0442   ALBUMIN 3.9 05/14/2020 0442   AST 19 05/14/2020 0442   ALT  13 05/14/2020 0442   ALKPHOS 85 05/14/2020 0442   BILITOT 0.7 05/14/2020 0442   GFRNONAA >60 05/14/2020 0442   GFRAA >60 05/14/2020 0442       Component Value Date/Time   WBC 13.8 (H) 05/14/2020 0442   RBC 4.92 05/14/2020 0442   HGB 14.7 05/14/2020 0442   HCT 44.8 05/14/2020 0442   PLT 338 05/14/2020 0442   MCV 91.1 05/14/2020 0442   MCH 29.9 05/14/2020 0442   MCHC 32.8 05/14/2020 0442   RDW 13.1 05/14/2020 0442   LYMPHSABS 1.3 03/15/2018 0846   MONOABS 0.5 03/15/2018 0846   EOSABS 0.1 03/15/2018 0846   BASOSABS 0.0 03/15/2018 0846    No results found for: POCLITH, LITHIUM   No results found for: PHENYTOIN, PHENOBARB, VALPROATE, CBMZ   .res Assessment: Plan:   Discussed that stimulants can occasionally cause perceptual disturbances and pt agrees that these s/s started around the time that she started stimulants and worsened when she was [facility]. Will hold Vyvanse for the next week to determine if this is helpful for perceptual disturbances. Also discussed considering re-start of Gabapentin if no improvement with holding Vyvanse. Discussed that lower dose of Gabapentin may be helpful, such as 100 mg po TID to be able to titrate to dose that is helpful for target s/s without causing significant sleep disturbance.  Will change hydroxyzine from 10 mg to 25 mg as needed since pt reports that hydroxyzine is more effective for her at this dose.  Recommend continuing Wellbutrin XL 150 mg daily for depression.  Recommend continuing [facility].  Pt to follow-up in 4-6 weeks or sooner if clinically indicated.  Patient advised to contact office with any questions, adverse effects, or acute worsening in signs and symptoms.  Trula OreChristina was seen today for medication problem and follow-up.  Diagnoses and all orders for this visit:  Attention deficit hyperactivity disorder (ADHD), combined type  Anxiety -     hydrOXYzine  (ATARAX/VISTARIL) 25 MG tablet; Take 1 tablet (25 mg total) by mouth every 6 (six) hours as needed.  Insomnia, unspecified type     Please see After Visit Summary for patient specific instructions.  Future Appointments  Date Time Provider Department Center  07/10/2020 10:00 AM Waldron SessionAndrews, [facility] None  07/23/2020 10:00 AM Waldron SessionAndrews, Christopher[facility] None  07/27/2020 10:00 AM Corie Chiquitoarter, Breawna Montenegro, [facility] CP-CP None  08/05/2020 10:00 AM Waldron SessionAndrews, Christopher[facility] None    No orders of the defined types were placed in this encounter.   -------------------------------

## 2020-06-29 ENCOUNTER — Telehealth: Payer: Self-pay | Admitting: Psychiatry

## 2020-06-29 NOTE — Telephone Encounter (Signed)
Received call from pt that perceptual disturbances have resolved since pt has stopped Vyvanse. Will discontinue Vyvanse.

## 2020-07-07 ENCOUNTER — Ambulatory Visit (INDEPENDENT_AMBULATORY_CARE_PROVIDER_SITE_OTHER): Payer: 59 | Admitting: Mental Health

## 2020-07-07 ENCOUNTER — Other Ambulatory Visit: Payer: Self-pay

## 2020-07-07 DIAGNOSIS — F419 Anxiety disorder, unspecified: Secondary | ICD-10-CM | POA: Diagnosis not present

## 2020-07-07 DIAGNOSIS — F902 Attention-deficit hyperactivity disorder, combined type: Secondary | ICD-10-CM

## 2020-07-07 NOTE — Progress Notes (Signed)
[facility] Counselor/Therapist Progress Note   Patient ID: [name], MRN: [id number]  Date:  07/07/20  Timespent: 56 minutes  Treatment Type: Individual therapy  Mental Status Exam:   Appearance:   Casual     Behavior:  Appropriate  Motor:  Normal  Speech/Language:   Normal Rate, rhythm   Affect:  Full Range  Mood:  anxious, pleasant   Thought process:  Coherent and Relevant  Thought content:    Logical, goal directed  Perceptual disturbances:    none  Orientation:  Full (Time, Place, and Person)  Attention:  Good  Concentration:  good  Memory:  intact  Fund of knowledge:   Good  Insight:    Good  Judgment:   Good  Impulse Control:  good    Reported Symptoms:  Depressed mood, anxiety most days, rumination, fatigue, anhedonia, some sleep disturbances  Risk Assessment: Danger to Self:  No Self-injurious Behavior: No Danger to Others: No Duty to Warn:no Physical Aggression / Violence:No  Access to Firearms a concern: No  Gang Involvement:No   Patient / guardian was educated about steps to take if suicide or homicide risk level increases between visits. While future psychiatric events cannot be accurately predicted, the patient does not currently require acute inpatient psychiatric care and does not currently meet [facility] involuntary commitment criteria.  Subjective:  Patient presents for session sharing progress.  She stated that she told her mother recently that she is going to move.  She stated that her mother did not react upset.  She said her mother continues to cope with some recovery from medical issues but is doing well.  Patient shared the need to have more of a boundary in this relationship and moving one part provide this.  She stated that some of her daily stress will decrease with the move, having a decreased amount of rent to page a month as well was shared.  She stated that she plans to possibly complete the move this weekend but  also has time to delay this plan as she is under no pressure to move back quickly.  She says she struggles with thoughts such as "I am just a failure" referring to her not completing this task quick enough.  She stated that she feels this is just "how my brain works" referring to her tendency to feel anxious and have these thoughts of thoughts.  She stated she works to remind herself of the steps she is taking and progress she is making toward these goals.  She shared the need to move also related to the loss of her dog which past few months ago, living in her current apartment has been difficult since.  Provide support and understanding as she processed feelings related continuing work with her from a cognitive behavioral framework.  Interventions: CBT, supportive therapy  Diagnosis:   ICD-10-CM   1. Attention deficit hyperactivity disorder (ADHD), combined type  F90.2   2. Anxiety  F41.9      Plan:  Patient to continue to be mindful of thoughts that increase her anxiety and stress.  Patient to continue to plan and allow herself to take specific steps without feeling overwhelmed and over tasking herself.   Long-term goal:   Identify the major life complexities from the past and present that form the basis for present feelings of anxiety and depression. Short-term goal:  Enhance ability to handle effectively maintain self care while coping with pain associated with  medical conditions.            Positive, realistic and idealistic self talk to keep feelings of anxiety and depression more infrequent.   Assessment of progress:  progressing   Waldron Session, [facility]

## 2020-07-08 ENCOUNTER — Telehealth: Payer: Self-pay | Admitting: Psychiatry

## 2020-07-08 NOTE — Telephone Encounter (Signed)
Looks like this was discussed at last visit

## 2020-07-08 NOTE — Telephone Encounter (Signed)
Pt would like a refill vyvanse 10mg . [facility] to [facility] [employer] on [address]

## 2020-07-09 ENCOUNTER — Other Ambulatory Visit: Payer: Self-pay

## 2020-07-09 NOTE — Telephone Encounter (Signed)
Patient notified per Vernona Rieger when she called in this morning, patient wants to get apt to discuss things with you. Vernona Rieger added her to cancellation list.

## 2020-07-10 ENCOUNTER — Ambulatory Visit: Payer: 59 | Admitting: Family Medicine

## 2020-07-10 ENCOUNTER — Ambulatory Visit: Payer: 59 | Admitting: Mental Health

## 2020-07-14 ENCOUNTER — Other Ambulatory Visit: Payer: Self-pay | Admitting: Psychiatry

## 2020-07-14 ENCOUNTER — Ambulatory Visit: Payer: 59 | Admitting: Family Medicine

## 2020-07-14 ENCOUNTER — Telehealth: Payer: Self-pay | Admitting: Psychiatry

## 2020-07-14 DIAGNOSIS — F419 Anxiety disorder, unspecified: Secondary | ICD-10-CM

## 2020-07-14 NOTE — Telephone Encounter (Signed)
Pt would like a refill on xanax, wellbutrin. Pt tried calling the pharmacy and they told her to call us. [facility].

## 2020-07-14 NOTE — Telephone Encounter (Signed)
Apt 11/01 with Shanda Bumps

## 2020-07-15 ENCOUNTER — Other Ambulatory Visit: Payer: Self-pay

## 2020-07-15 ENCOUNTER — Ambulatory Visit (INDEPENDENT_AMBULATORY_CARE_PROVIDER_SITE_OTHER): Payer: 59 | Admitting: Family Medicine

## 2020-07-15 ENCOUNTER — Encounter: Payer: Self-pay | Admitting: Family Medicine

## 2020-07-15 VITALS — BP 120/80 | HR 93 | Temp 98.1°F | Ht 67.0 in | Wt 121.6 lb

## 2020-07-15 DIAGNOSIS — G4700 Insomnia, unspecified: Secondary | ICD-10-CM | POA: Diagnosis not present

## 2020-07-15 DIAGNOSIS — F900 Attention-deficit hyperactivity disorder, predominantly inattentive type: Secondary | ICD-10-CM | POA: Insufficient documentation

## 2020-07-15 DIAGNOSIS — F331 Major depressive disorder, recurrent, moderate: Secondary | ICD-10-CM

## 2020-07-15 MED ORDER — BUPROPION HCL ER (XL) 150 MG PO TB24: 150.0000 mg | ORAL_TABLET | Freq: Every day | ORAL | 0 refills | Status: DC

## 2020-07-15 MED ORDER — LISDEXAMFETAMINE DIMESYLATE 10 MG PO CAPS: 10.0000 mg | ORAL_CAPSULE | Freq: Every day | ORAL | 0 refills | Status: DC

## 2020-07-15 MED ORDER — TEMAZEPAM 30 MG PO CAPS: 30.0000 mg | ORAL_CAPSULE | Freq: Every evening | ORAL | 0 refills | Status: DC | PRN

## 2020-07-15 NOTE — Telephone Encounter (Signed)
Refills sent

## 2020-07-15 NOTE — Progress Notes (Signed)
   Subjective:    Patient ID: [name], female    DOB: Apr 29, 1966, 54 y.o.   MRN: [id number]  HPI Here for several issues. She will be moving to [facility] this weekend and she asks for several refills before then. She has been taking Temazepam 15 mg at bedtime for sleep but she asks if the dose can be increased. She sleeps well the first part if the night but she wakes up early. Also she had been seeing Ilda Basset NP for ADHD, and she has been taking Vyvanse 10 mg daily. She asks if we can prescribe a small supply of this to last until she can get established with a new provider in [place].    Review of Systems  Constitutional: Negative.   Respiratory: Negative.   Cardiovascular: Negative.        Objective:   Physical Exam Constitutional:      Appearance: Normal appearance.  Cardiovascular:     Rate and Rhythm: Normal rate and regular rhythm.     Pulses: Normal pulses.     Heart sounds: Normal heart sounds.  Pulmonary:     Effort: Pulmonary effort is normal.     Breath sounds: Normal breath sounds.  Neurological:     Mental Status: She is alert.           Assessment & Plan:  For insomnia, we will increase the Temazepam to 30 mg qhs. For the ADHD we will write for 30 days of Vyvanse 10 mg each morning.  Gershon Crane, MD

## 2020-07-16 ENCOUNTER — Ambulatory Visit (INDEPENDENT_AMBULATORY_CARE_PROVIDER_SITE_OTHER): Payer: 59 | Admitting: Mental Health

## 2020-07-16 DIAGNOSIS — F902 Attention-deficit hyperactivity disorder, combined type: Secondary | ICD-10-CM | POA: Diagnosis not present

## 2020-07-16 DIAGNOSIS — F419 Anxiety disorder, unspecified: Secondary | ICD-10-CM

## 2020-07-16 NOTE — Progress Notes (Signed)
         [facility] Counselor/Therapist Progress Note   Patient ID: [name], MRN: [id number]  Date:  07/16/20  Timespent: 56 minutes  Treatment Type: Individual therapy  Mental Status Exam:   Appearance:   Casual     Behavior:  Appropriate  Motor:  Normal  Speech/Language:   Normal Rate, rhythm   Affect:  Full Range  Mood:  anxious, pleasant   Thought process:  Coherent and Relevant  Thought content:    Logical, goal directed  Perceptual disturbances:    none  Orientation:  Full (Time, Place, and Person)  Attention:  Good  Concentration:  good  Memory:  intact  Fund of knowledge:   Good  Insight:    Good  Judgment:   Good  Impulse Control:  good    Reported Symptoms: Distractibility, problems with focus and attention, rumination, fatigue, sleep disturbance  Risk Assessment: Danger to Self:  No Self-injurious Behavior: No Danger to Others: No Duty to Warn:no Physical Aggression / Violence:No  Access to Firearms a concern: No  Gang Involvement:No   Patient / guardian was educated about steps to take if suicide or homicide risk level increases between visits. While future psychiatric events cannot be accurately predicted, the patient does not currently require acute inpatient psychiatric care and does not currently meet [facility] involuntary commitment criteria.  Subjective:  Patient presents for session in no distress.  She shared recent events and plans.  She stated that she plans to move this weekend, shared some details related to logistics and trying to schedule accordingly.  She shared sentiments of being excited about the move, this change in her life of relocating to town with which she has found memories and friendships.  She states she has 2 jobs she is interviewing for next week and hopes to obtain work soon.  She stated that she is secure financially with this move as it will decrease her monthly rent and utilities as she is living with a  friend renting out a room in a townhouse.  She stated that she saw her family doctor recently and is started back on Vyvanse.  She is noticeably more focused in session, less distractible and thinking appears more linear and goal-directed.  She shared the progress with which she has made in therapy and plans to continue upon relocating and settling.  Provide support and encouragement related to her plans. encouraged her to reach out to our office with any questions or concerns.  Interventions: CBT, supportive therapy  Diagnosis:   ICD-10-CM   1. Attention deficit hyperactivity disorder (ADHD), combined type  F90.2   2. Anxiety  F41.9      Plan:  Patient to contact this office with any questions or concerns for continuity of care.   Assessment of progress:  progressing   Waldron Session, [facility]

## 2020-07-23 ENCOUNTER — Ambulatory Visit: Payer: 59 | Admitting: Mental Health

## 2020-07-27 ENCOUNTER — Ambulatory Visit: Payer: 59 | Admitting: Psychiatry

## 2020-08-01 ENCOUNTER — Other Ambulatory Visit: Payer: Self-pay | Admitting: Psychiatry

## 2020-08-01 DIAGNOSIS — F419 Anxiety disorder, unspecified: Secondary | ICD-10-CM

## 2020-08-03 ENCOUNTER — Other Ambulatory Visit: Payer: Self-pay | Admitting: Family Medicine

## 2020-08-03 DIAGNOSIS — J3089 Other allergic rhinitis: Secondary | ICD-10-CM

## 2020-08-05 ENCOUNTER — Ambulatory Visit: Payer: 59 | Admitting: Mental Health

## 2020-08-12 ENCOUNTER — Telehealth: Payer: Self-pay | Admitting: Family Medicine

## 2020-08-12 NOTE — Telephone Encounter (Signed)
[name] is calling and waned to speak to someone regarding a prescription for temazepam, please advise. CB is [phone]

## 2020-08-12 NOTE — Telephone Encounter (Signed)
Spoke to patient and VV scheduled for med refills.   Spoke to [place] and advised that patient doesn't need either filled right now due to it isn't time for insurance to pay for it and will get a refill from provider next week sent in.  Dm/cma

## 2020-08-16 ENCOUNTER — Other Ambulatory Visit: Payer: Self-pay | Admitting: Family Medicine

## 2020-08-16 ENCOUNTER — Encounter: Payer: Self-pay | Admitting: Family Medicine

## 2020-08-17 MED ORDER — LISDEXAMFETAMINE DIMESYLATE 10 MG PO CAPS
10.0000 mg | ORAL_CAPSULE | Freq: Every day | ORAL | 0 refills | Status: DC
Start: 2020-08-17 — End: 2020-12-07

## 2020-08-17 MED ORDER — TEMAZEPAM 30 MG PO CAPS: 30.0000 mg | ORAL_CAPSULE | Freq: Every evening | ORAL | 0 refills | Status: DC | PRN

## 2020-08-17 NOTE — Telephone Encounter (Signed)
I sent in 30 days of both medications

## 2020-08-19 ENCOUNTER — Telehealth: Payer: 59 | Admitting: Family Medicine

## 2020-08-19 ENCOUNTER — Other Ambulatory Visit: Payer: Self-pay | Admitting: Family Medicine

## 2020-09-09 DIAGNOSIS — M961 Postlaminectomy syndrome, not elsewhere classified: Secondary | ICD-10-CM | POA: Insufficient documentation

## 2020-09-11 ENCOUNTER — Other Ambulatory Visit: Payer: Self-pay | Admitting: Family Medicine

## 2020-09-18 ENCOUNTER — Other Ambulatory Visit: Payer: Self-pay | Admitting: Psychiatry

## 2020-09-18 ENCOUNTER — Other Ambulatory Visit: Payer: Self-pay | Admitting: Family Medicine

## 2020-09-18 DIAGNOSIS — J4520 Mild intermittent asthma, uncomplicated: Secondary | ICD-10-CM

## 2020-09-18 DIAGNOSIS — F419 Anxiety disorder, unspecified: Secondary | ICD-10-CM

## 2020-09-18 DIAGNOSIS — J3089 Other allergic rhinitis: Secondary | ICD-10-CM

## 2020-10-24 IMAGING — CR CHEST - 2 VIEW
2 series · 2 of 2 positions shown · non-contrast
Comparison: None.

CLINICAL DATA: Pre operative respiratory exam.

EXAM:
CHEST - 2 VIEW

[w chest pa]
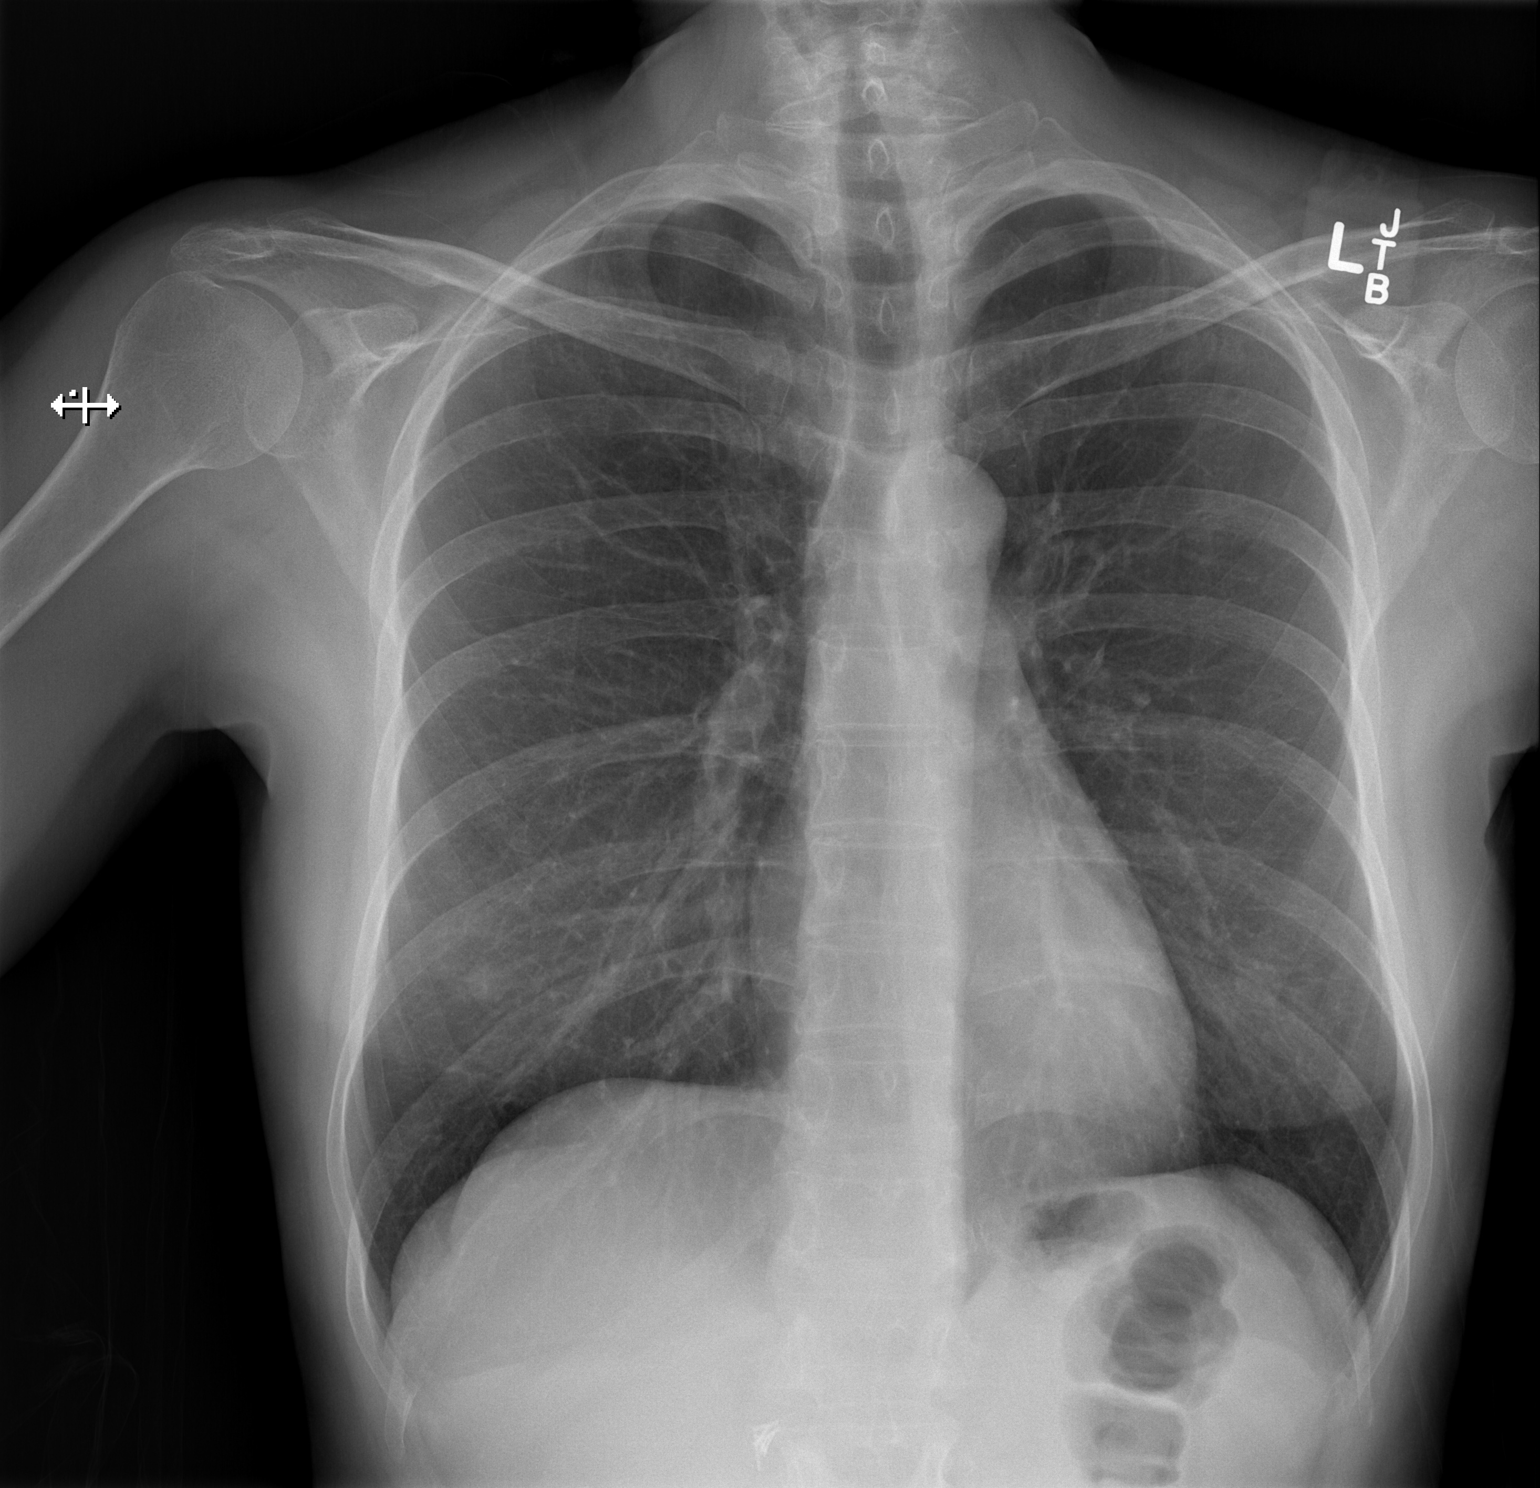

[w chest lat]
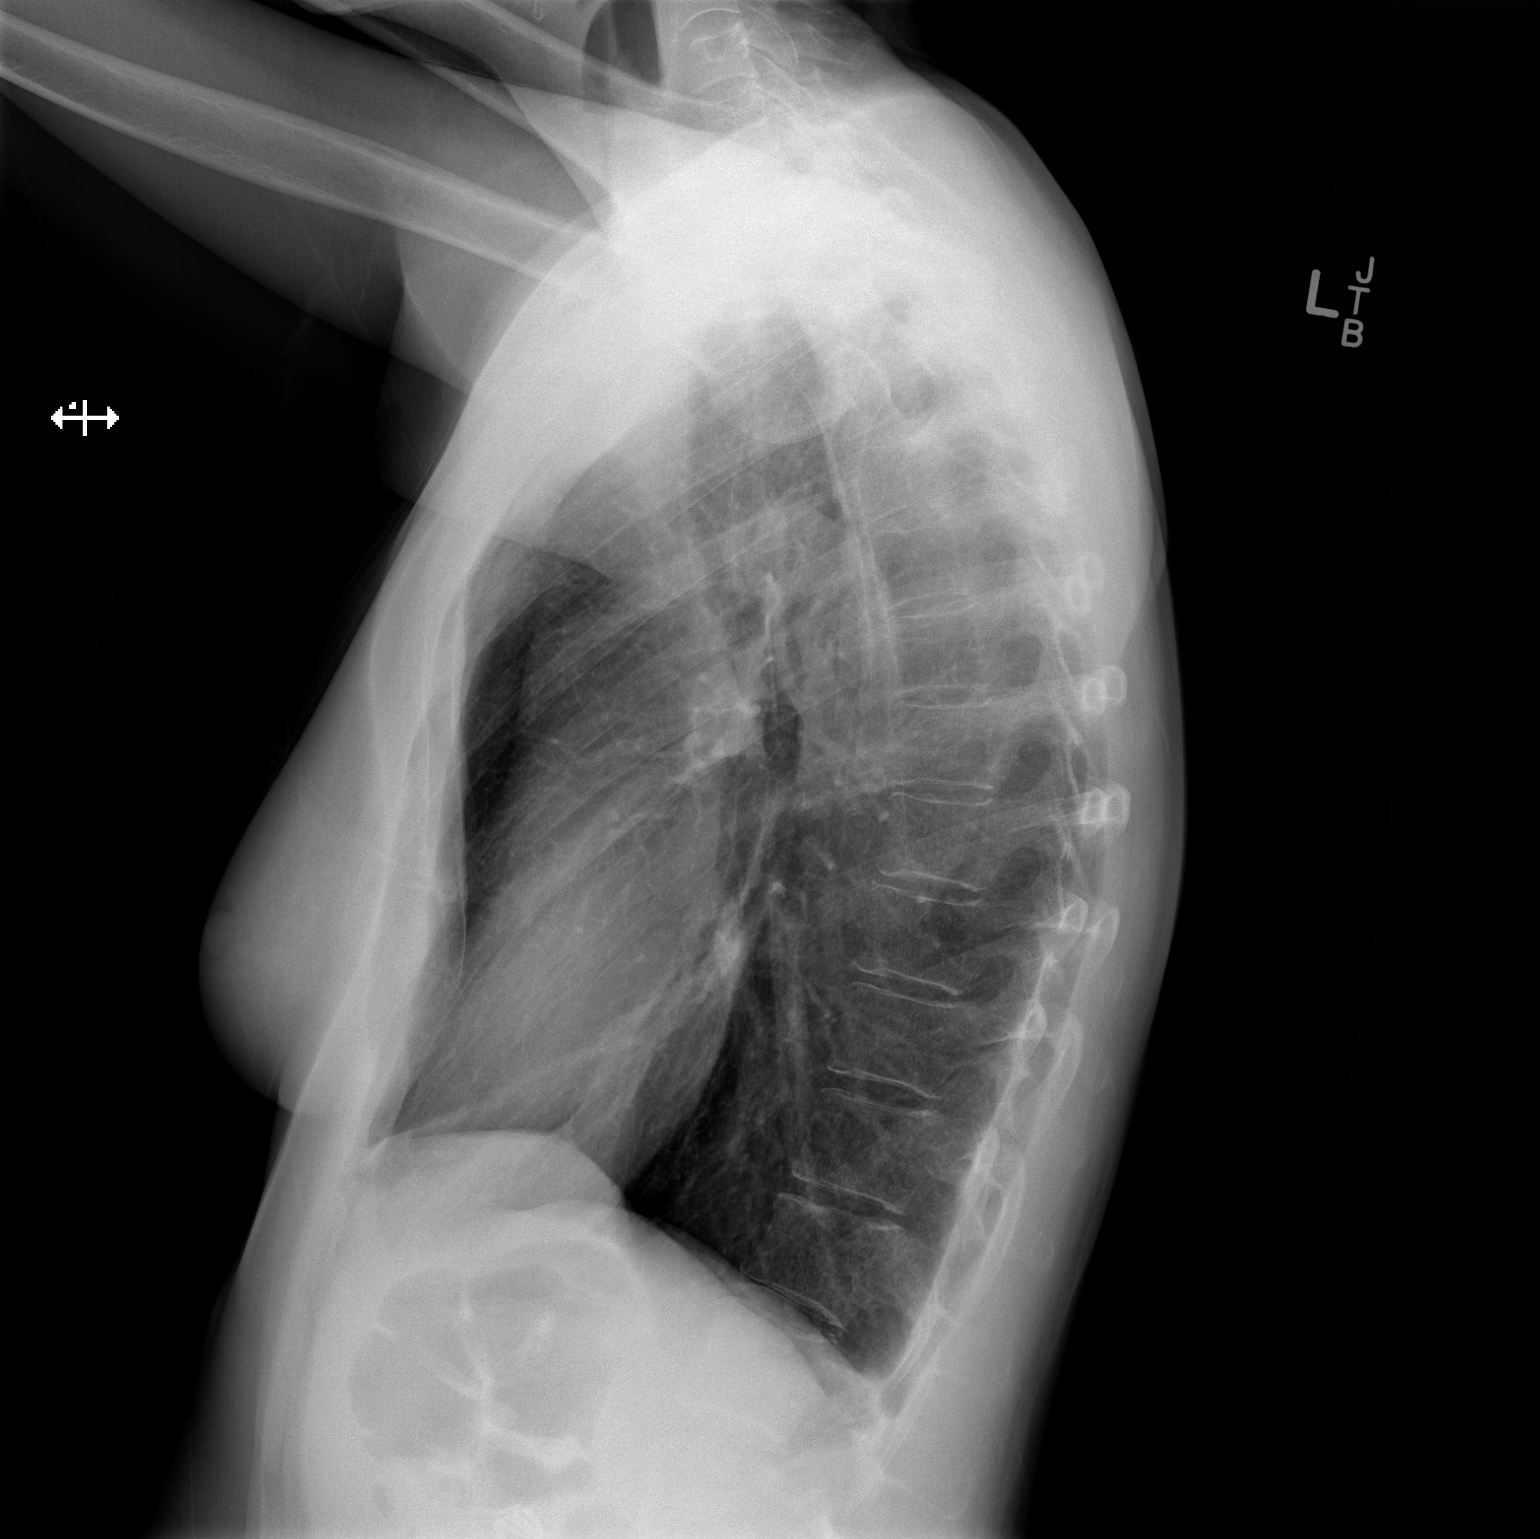

[2 of 2 positions shown; findings below may reference images not displayed]

FINDINGS: The heart size and mediastinal contours are within normal limits.
Both lungs are clear. The visualized skeletal structures are
unremarkable.
IMPRESSION: Normal exam.

## 2020-12-01 ENCOUNTER — Ambulatory Visit: Payer: 59 | Admitting: Mental Health

## 2020-12-07 ENCOUNTER — Ambulatory Visit (INDEPENDENT_AMBULATORY_CARE_PROVIDER_SITE_OTHER): Payer: 59 | Admitting: Family Medicine

## 2020-12-07 ENCOUNTER — Other Ambulatory Visit: Payer: Self-pay

## 2020-12-07 ENCOUNTER — Encounter: Payer: Self-pay | Admitting: Family Medicine

## 2020-12-07 ENCOUNTER — Ambulatory Visit: Payer: 59 | Admitting: Family Medicine

## 2020-12-07 VITALS — BP 98/70 | HR 64 | Temp 97.9°F | Wt 128.2 lb

## 2020-12-07 DIAGNOSIS — F900 Attention-deficit hyperactivity disorder, predominantly inattentive type: Secondary | ICD-10-CM | POA: Diagnosis not present

## 2020-12-07 DIAGNOSIS — F32A Depression, unspecified: Secondary | ICD-10-CM

## 2020-12-07 DIAGNOSIS — G4700 Insomnia, unspecified: Secondary | ICD-10-CM

## 2020-12-07 DIAGNOSIS — F419 Anxiety disorder, unspecified: Secondary | ICD-10-CM

## 2020-12-07 DIAGNOSIS — F331 Major depressive disorder, recurrent, moderate: Secondary | ICD-10-CM

## 2020-12-07 MED ORDER — LISDEXAMFETAMINE DIMESYLATE 10 MG PO CAPS
10.0000 mg | ORAL_CAPSULE | Freq: Every day | ORAL | 0 refills | Status: DC
Start: 2020-12-07 — End: 2020-12-07

## 2020-12-07 MED ORDER — LISDEXAMFETAMINE DIMESYLATE 10 MG PO CAPS: 10.0000 mg | ORAL_CAPSULE | Freq: Every day | ORAL | 0 refills | Status: DC

## 2020-12-07 MED ORDER — HYDROXYZINE HCL 25 MG PO TABS: 25.0000 mg | ORAL_TABLET | Freq: Four times a day (QID) | ORAL | 3 refills | Status: DC | PRN

## 2020-12-07 MED ORDER — BUPROPION HCL ER (XL) 150 MG PO TB24: 150.0000 mg | ORAL_TABLET | Freq: Every day | ORAL | 3 refills | Status: DC

## 2020-12-07 MED ORDER — ALPRAZOLAM 1 MG PO TABS: 1.0000 mg | ORAL_TABLET | Freq: Two times a day (BID) | ORAL | 1 refills | Status: DC | PRN

## 2020-12-07 NOTE — Progress Notes (Signed)
   Subjective:    Patient ID: [name], female    DOB: Dec 01, 1965, 55 y.o.   MRN: [id number]  HPI Here to follow up on issues. She is doing well. She stopped Temazepam due to side effects likek weight gain, and now she takes Xanax at bedtime. She is sleeping well. She is living outside [facility], but she is in town periodically to check on her parents who live here.    Review of Systems  Constitutional: Negative.   Respiratory: Negative.   Cardiovascular: Negative.   Neurological: Negative.   Psychiatric/Behavioral: Negative.        Objective:   Physical Exam Constitutional:      Appearance: Normal appearance.  Cardiovascular:     Rate and Rhythm: Normal rate and regular rhythm.     Pulses: Normal pulses.     Heart sounds: Normal heart sounds.  Pulmonary:     Effort: Pulmonary effort is normal.     Breath sounds: Normal breath sounds.  Neurological:     General: No focal deficit present.     Mental Status: She is alert and oriented to person, place, and time.  Psychiatric:        Mood and Affect: Mood normal.        Thought Content: Thought content normal.           Assessment & Plan:  Her anxiety, depression and ADHD are stable. Her insomnia is stable on Xanax. Medications were refilled.  Gershon Crane, MD

## 2020-12-12 IMAGING — RF DG LUMBAR SPINE 2-3V
1 series · 2 of 2 positions shown · non-contrast
Comparison: None.

CLINICAL DATA: Intraoperative imaging for L5-S1 anterior fusion.

EXAM:
LUMBAR SPINE - 2-3 VIEW; DG C-ARM 61-120 MIN

[Series 1: run · 2 of 2 slices shown]
[im 1/2]
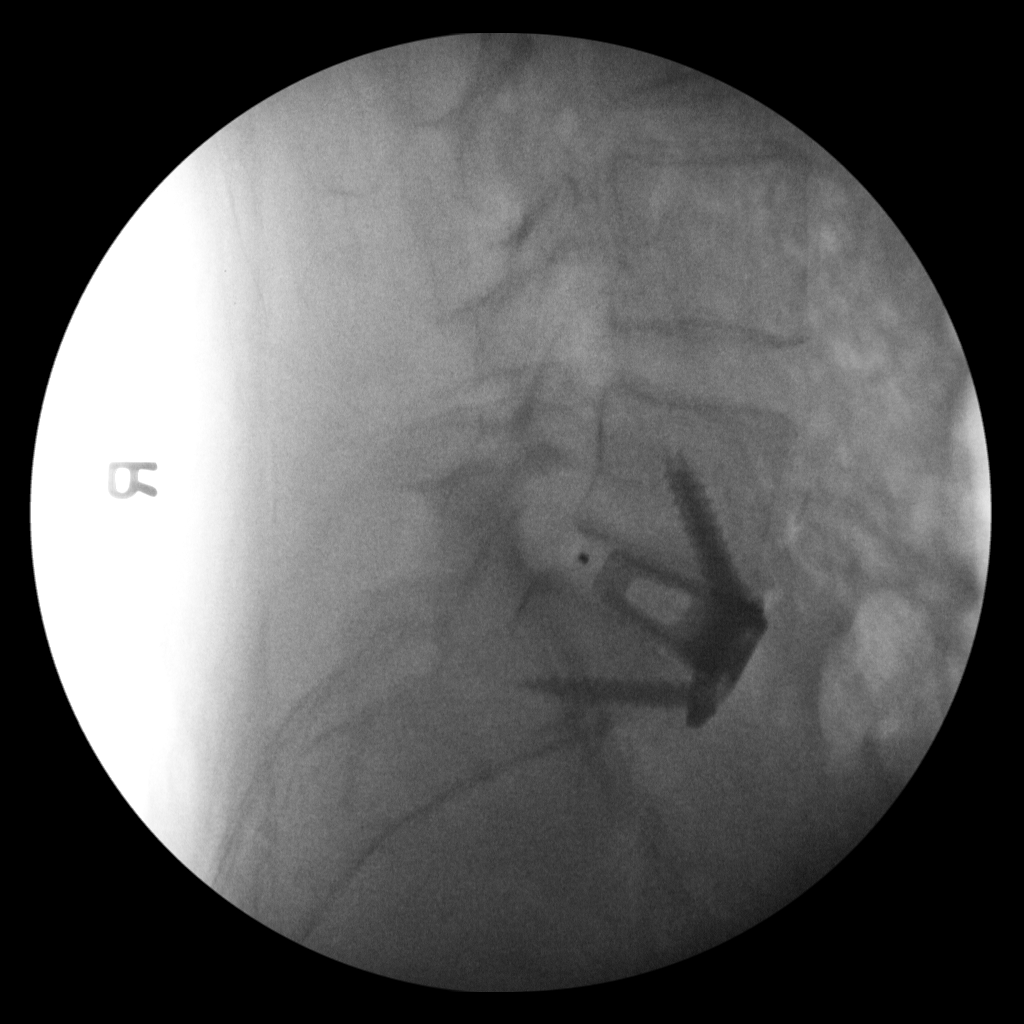
[im 2/2]
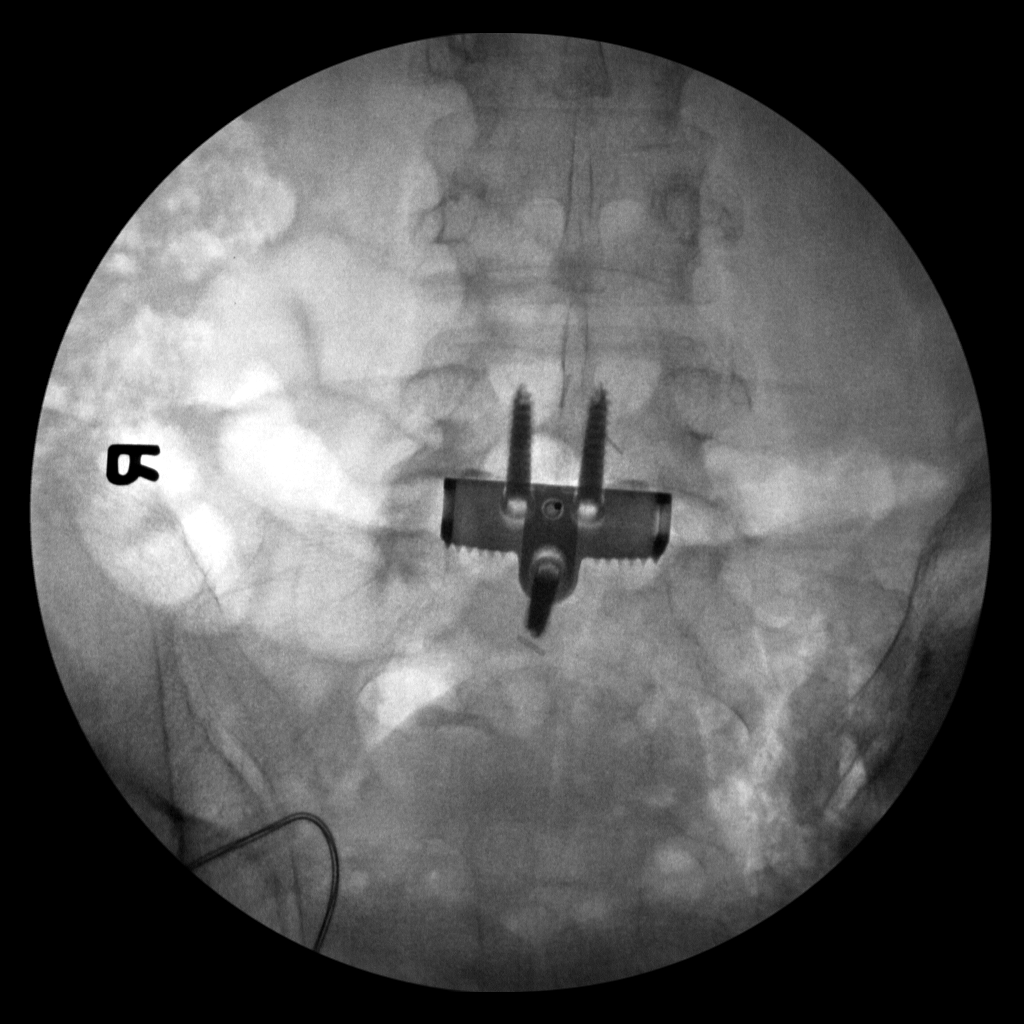

[2 of 2 positions shown; findings below may reference images not displayed]

FINDINGS: Two fluoroscopic spot views of the lower lumbar spine demonstrate
anterior plate and screws interbody spacer in place at L5-S1. No
acute abnormality.
IMPRESSION: Intraoperative imaging for L5-S1 anterior fusion.

## 2020-12-12 IMAGING — CR OR LOCAL ABDOMEN
1 series · 1 of 1 positions shown · non-contrast
Comparison: CT scan of the abdomen and pelvis dated 02/03/2017

CLINICAL DATA: Anterior lower lumbar fusion. Looking for retained
instruments.

EXAM:
OR LOCAL ABDOMEN

[AP]
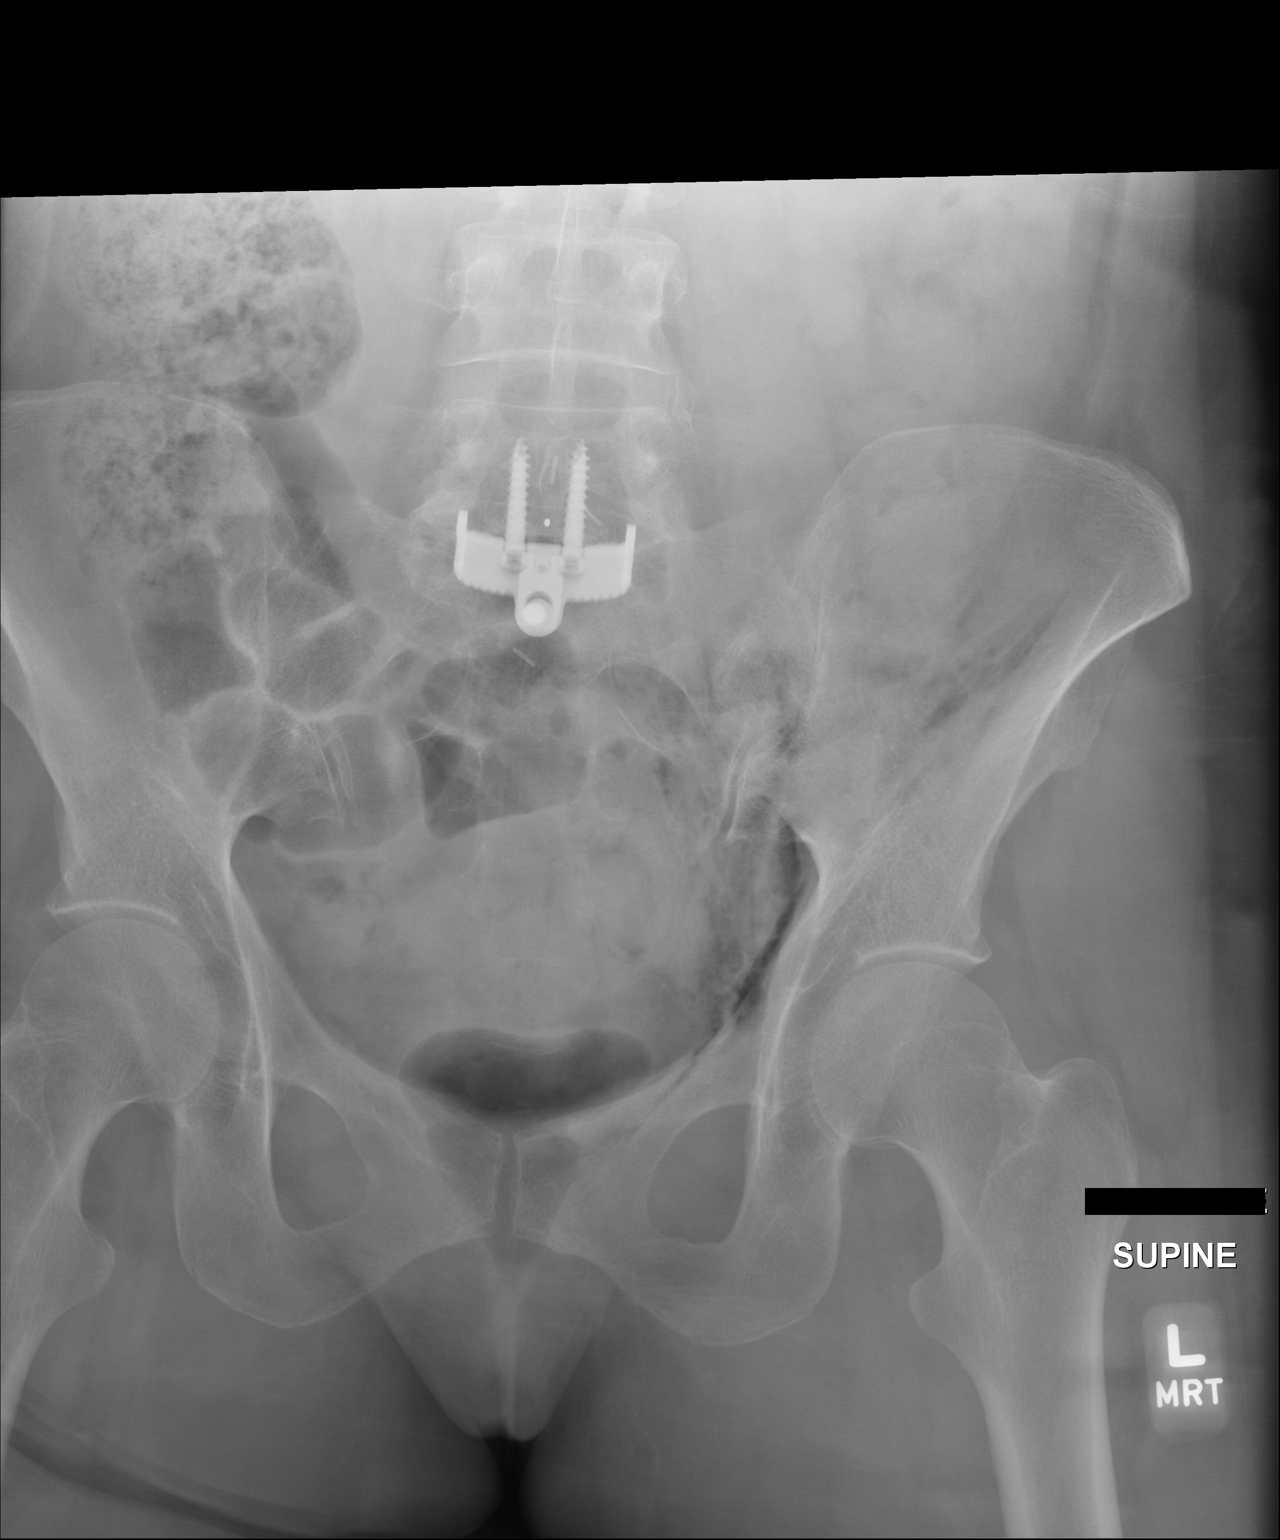

[1 of 1 positions shown; findings below may reference images not displayed]

FINDINGS: There are no retained surgical instruments. Hardware for anterior
fusion at L5-S1. Postsurgical air in the left side of the pelvis to
the expected degree. Bowel gas pattern is normal.
IMPRESSION: No retained surgical instruments after anterior fusion at L5-S1.
Report was called to the operating room by Dr. Fuchigami at [DATE] p.m.

## 2021-01-12 ENCOUNTER — Other Ambulatory Visit: Payer: Self-pay | Admitting: Family Medicine

## 2021-01-12 DIAGNOSIS — G43809 Other migraine, not intractable, without status migrainosus: Secondary | ICD-10-CM

## 2021-01-12 NOTE — Telephone Encounter (Signed)
Last refill- 01/07/2020-30 tabs 3 refills Last office visit- 12/07/20  Next office visit not scheduled

## 2021-01-13 ENCOUNTER — Other Ambulatory Visit: Payer: Self-pay

## 2021-01-13 ENCOUNTER — Ambulatory Visit (INDEPENDENT_AMBULATORY_CARE_PROVIDER_SITE_OTHER): Payer: 59 | Admitting: Family Medicine

## 2021-01-13 ENCOUNTER — Encounter: Payer: Self-pay | Admitting: Family Medicine

## 2021-01-13 VITALS — BP 98/70 | HR 80 | Temp 98.7°F | Wt 127.0 lb

## 2021-01-13 DIAGNOSIS — R350 Frequency of micturition: Secondary | ICD-10-CM | POA: Diagnosis not present

## 2021-01-13 DIAGNOSIS — R531 Weakness: Secondary | ICD-10-CM

## 2021-01-13 LAB — BASIC METABOLIC PANEL
BUN: 16 mg/dL (ref 6–23)
CO2: 28 mEq/L (ref 19–32)
Calcium: 9.9 mg/dL (ref 8.4–10.5)
Chloride: 105 mEq/L (ref 96–112)
Creatinine, Ser: 0.88 mg/dL (ref 0.40–1.20)
GFR: 75.11 mL/min (ref >60.00)
Glucose, Bld: 89 mg/dL (ref 70–99)
Potassium: 4.4 mEq/L (ref 3.5–5.1)
Sodium: 141 mEq/L (ref 135–145)

## 2021-01-13 LAB — CBC WITH DIFFERENTIAL/PLATELET
Basophils Absolute: 0 10*3/uL (ref 0.0–0.1)
Basophils Relative: 0.8 % (ref 0.0–3.0)
Eosinophils Absolute: 0.2 10*3/uL (ref 0.0–0.7)
Eosinophils Relative: 3.2 % (ref 0.0–5.0)
HCT: 43.7 % (ref 36.0–46.0)
Hemoglobin: 14.3 g/dL (ref 12.0–15.0)
Lymphocytes Relative: 33.2 % (ref 12.0–46.0)
Lymphs Abs: 1.7 10*3/uL (ref 0.7–4.0)
MCHC: 32.8 g/dL (ref 30.0–36.0)
MCV: 87.7 fl (ref 78.0–100.0)
Monocytes Absolute: 0.7 10*3/uL (ref 0.1–1.0)
Monocytes Relative: 13.1 % — ABNORMAL HIGH (ref 3.0–12.0)
Neutro Abs: 2.5 10*3/uL (ref 1.4–7.7)
Neutrophils Relative %: 49.7 % (ref 43.0–77.0)
Platelets: 312 10*3/uL (ref 150.0–400.0)
RBC: 4.98 Mil/uL (ref 3.87–5.11)
RDW: 14.2 % (ref 11.5–15.5)
WBC: 5.1 10*3/uL (ref 4.0–10.5)

## 2021-01-13 LAB — T3, FREE: T3, Free: 3.6 pg/mL (ref 2.3–4.2)

## 2021-01-13 LAB — POC URINALSYSI DIPSTICK (AUTOMATED)
Bilirubin, UA: NEGATIVE
Blood, UA: NEGATIVE
Glucose, UA: NEGATIVE
Ketones, UA: NEGATIVE
Leukocytes, UA: NEGATIVE
Nitrite, UA: NEGATIVE
Protein, UA: POSITIVE — AB
Spec Grav, UA: 1.01 (ref 1.010–1.025)
Urobilinogen, UA: 0.2 E.U./dL
pH, UA: 7 (ref 5.0–8.0)

## 2021-01-13 LAB — HEPATIC FUNCTION PANEL
ALT: 32 U/L (ref 0–35)
AST: 24 U/L (ref 0–37)
Albumin: 4.1 g/dL (ref 3.5–5.2)
Alkaline Phosphatase: 107 U/L (ref 39–117)
Bilirubin, Direct: 0.1 mg/dL (ref 0.0–0.3)
Total Bilirubin: 0.4 mg/dL (ref 0.2–1.2)
Total Protein: 7.2 g/dL (ref 6.0–8.3)

## 2021-01-13 LAB — LIPID PANEL
Cholesterol: 225 mg/dL — ABNORMAL HIGH (ref 0–200)
HDL: 59 mg/dL (ref >39.00)
LDL Cholesterol: 139 mg/dL — ABNORMAL HIGH (ref 0–99)
NonHDL: 166.32
Total CHOL/HDL Ratio: 4
Triglycerides: 137 mg/dL (ref 0.0–149.0)
VLDL: 27.4 mg/dL (ref 0.0–40.0)

## 2021-01-13 LAB — TSH: TSH: 1.35 u[IU]/mL (ref 0.35–4.50)

## 2021-01-13 LAB — T4, FREE: Free T4: 0.65 ng/dL (ref 0.60–1.60)

## 2021-01-13 LAB — VITAMIN B12: Vitamin B-12: 245 pg/mL (ref 211–911)

## 2021-01-13 LAB — HEMOGLOBIN A1C: Hgb A1c MFr Bld: 5.8 % (ref 4.6–6.5)

## 2021-01-13 NOTE — Progress Notes (Signed)
   Subjective:    Patient ID: [name], female    DOB: [birth year], 55 y.o.   MRN: [id number]  HPI Here asking to be checked for a UTI or diabetes. She has been very stressed the past few weeks, with turning down one job and signing up for another. She lives in [place], but she is here visiting her mother. Overt he past week she has felt bouts of weakness and shakiness, despite eating regular meals. She also has had to urinate frequently. No burning or urgency.    Review of Systems  Constitutional: Positive for fatigue.  Respiratory: Negative.   Cardiovascular: Negative.   Gastrointestinal: Negative.   Genitourinary: Positive for frequency. Negative for dysuria and urgency.       Objective:   Physical Exam Constitutional:      General: She is not in acute distress.    Appearance: Normal appearance.  Cardiovascular:     Rate and Rhythm: Normal rate and regular rhythm.     Pulses: Normal pulses.     Heart sounds: Normal heart sounds.  Pulmonary:     Effort: Pulmonary effort is normal.     Breath sounds: Normal breath sounds.  Neurological:     Mental Status: She is alert.           Assessment & Plan:  She has had various symptoms that are most likely the result of stress. Her UA today is clear. We will send her for other labs, including an A1c, to rule out other issues.  Gershon Crane, MD

## 2021-01-14 ENCOUNTER — Encounter: Payer: Self-pay | Admitting: Family Medicine

## 2021-01-14 NOTE — Telephone Encounter (Signed)
I don't think she needs to take a cholesterol medication yet because her good cholesterol (HDL) is so high. She does NOT have a urine infection. The protein in the urine may be from a little dehydration, so be sure to drink plenty of water every day

## 2021-02-24 ENCOUNTER — Other Ambulatory Visit: Payer: Self-pay | Admitting: Family Medicine

## 2021-02-24 DIAGNOSIS — B009 Herpesviral infection, unspecified: Secondary | ICD-10-CM

## 2021-03-19 ENCOUNTER — Other Ambulatory Visit: Payer: Self-pay | Admitting: Psychiatry

## 2021-03-19 DIAGNOSIS — F331 Major depressive disorder, recurrent, moderate: Secondary | ICD-10-CM

## 2021-03-23 NOTE — Telephone Encounter (Signed)
Is she finding a new provider?

## 2021-03-23 NOTE — Telephone Encounter (Signed)
yes

## 2021-03-23 NOTE — Telephone Encounter (Signed)
Set up apt with Shanda Bumps for refills

## 2021-03-23 NOTE — Telephone Encounter (Signed)
Pt lives in [place]

## 2021-04-14 ENCOUNTER — Other Ambulatory Visit: Payer: Self-pay | Admitting: Family Medicine

## 2021-04-14 DIAGNOSIS — F419 Anxiety disorder, unspecified: Secondary | ICD-10-CM

## 2021-04-14 NOTE — Telephone Encounter (Signed)
Last refill--12/07/20--90 tabs, 1 refill.   1 tab bid Last office visit- 01/13/21  No future appointment scheduled

## 2021-10-13 ENCOUNTER — Other Ambulatory Visit: Payer: Self-pay | Admitting: Family Medicine

## 2021-10-13 DIAGNOSIS — F419 Anxiety disorder, unspecified: Secondary | ICD-10-CM

## 2021-10-14 NOTE — Telephone Encounter (Signed)
Last OV-01/13/2021 Last refill-04/15/2021--90 tabs, 5 refills  No future OV scheduled.    Can this patient receive a refill

## 2022-01-09 ENCOUNTER — Other Ambulatory Visit: Payer: Self-pay | Admitting: Family Medicine

## 2022-01-09 DIAGNOSIS — F419 Anxiety disorder, unspecified: Secondary | ICD-10-CM

## 2022-01-30 ENCOUNTER — Other Ambulatory Visit: Payer: Self-pay | Admitting: Family Medicine

## 2022-01-30 DIAGNOSIS — F419 Anxiety disorder, unspecified: Secondary | ICD-10-CM

## 2022-02-11 ENCOUNTER — Other Ambulatory Visit: Payer: Self-pay | Admitting: Family Medicine

## 2022-02-11 DIAGNOSIS — G43809 Other migraine, not intractable, without status migrainosus: Secondary | ICD-10-CM

## 2022-02-15 ENCOUNTER — Other Ambulatory Visit: Payer: Self-pay | Admitting: Family Medicine

## 2022-02-15 DIAGNOSIS — G43809 Other migraine, not intractable, without status migrainosus: Secondary | ICD-10-CM

## 2022-06-27 ENCOUNTER — Encounter: Payer: Self-pay | Admitting: Family Medicine

## 2022-06-27 ENCOUNTER — Ambulatory Visit (INDEPENDENT_AMBULATORY_CARE_PROVIDER_SITE_OTHER): Payer: No Typology Code available for payment source | Admitting: Family Medicine

## 2022-06-27 VITALS — BP 98/76 | HR 80 | Temp 98.2°F | Wt 124.0 lb

## 2022-06-27 DIAGNOSIS — S83411A Sprain of medial collateral ligament of right knee, initial encounter: Secondary | ICD-10-CM | POA: Diagnosis not present

## 2022-06-27 DIAGNOSIS — M810 Age-related osteoporosis without current pathological fracture: Secondary | ICD-10-CM

## 2022-06-27 DIAGNOSIS — Z0000 Encounter for general adult medical examination without abnormal findings: Secondary | ICD-10-CM

## 2022-06-27 DIAGNOSIS — F419 Anxiety disorder, unspecified: Secondary | ICD-10-CM

## 2022-06-27 DIAGNOSIS — F32A Depression, unspecified: Secondary | ICD-10-CM

## 2022-06-27 MED ORDER — IBUPROFEN 800 MG PO TABS: 800.0000 mg | ORAL_TABLET | Freq: Three times a day (TID) | ORAL | 3 refills | Status: DC | PRN

## 2022-06-27 MED ORDER — DIAZEPAM 10 MG PO TABS: 10.0000 mg | ORAL_TABLET | Freq: Every day | ORAL | 0 refills | Status: DC | PRN

## 2022-06-27 MED ORDER — DIAZEPAM 5 MG PO TABS: 5.0000 mg | ORAL_TABLET | Freq: Every day | ORAL | 0 refills | Status: DC | PRN

## 2022-06-27 NOTE — Progress Notes (Signed)
   Subjective:    Patient ID: [name], female    DOB: December 05, 1965, 56 y.o.   MRN: [id number]  HPI Here for several issues. She had been living in [facility] for the past year to take care of her father. However he passed away, and she has recently moved back to [place]. She has been dealing with a lot of anxiety, partly due to her father's death, but also because her boyfriend passed away a few months ago. She had been seeing a primary care provider in [place], who switched her from Xanax to Valium. She has been taking a total of 15 mg of Valium daily. She has spoken to the [facility] here, and she plans to see a NP there for medications and to see a therapist. She asks for a 30 day supply to cover her in the interim. Also about 2 weeks ago she tripped and fell forward, twisting her right knee. It has remained slightly swollen since then. She has pain when walking on it, but this has improved a bit. She takes Ibuprofen as needed, and she wears a brace.    Review of Systems  Constitutional: Negative.   Respiratory: Negative.    Cardiovascular: Negative.   Musculoskeletal:  Positive for arthralgias.  Psychiatric/Behavioral:  Positive for decreased concentration and sleep disturbance. Negative for agitation, confusion and dysphoric mood. The patient is nervous/anxious.        Objective:   Physical Exam Constitutional:      Appearance: Normal appearance.     Comments: Walks without a limp   Cardiovascular:     Rate and Rhythm: Normal rate and regular rhythm.     Pulses: Normal pulses.     Heart sounds: Normal heart sounds.  Pulmonary:     Effort: Pulmonary effort is normal.     Breath sounds: Normal breath sounds.  Musculoskeletal:     Comments: Right knee is slightly swollen. Not red or warm. She is tender over the medial joint space. Flexion and extension are intact. Anterior drawers are negative. No crepitus   Neurological:     Mental Status: She  is alert.  Psychiatric:        Mood and Affect: Mood normal.        Behavior: Behavior normal.        Thought Content: Thought content normal.           Assessment & Plan:  Knee sprain, possibly a small tear of the medial meniscus. She will wear the brace at all times and she will stay off the leg as much as possible. Apply ice and take Ibuprofen as needed. Hopefully this will heal on its own. For the anxiety, I wrote for 30 days of Valium as above.  Alysia Penna, MD

## 2022-07-13 ENCOUNTER — Other Ambulatory Visit: Payer: Self-pay | Admitting: Family Medicine

## 2022-07-15 NOTE — Telephone Encounter (Signed)
Last refill-06/27/22--30 tabs, 0 refills Last OV-06/27/2022  No future OV scheduled

## 2022-07-18 NOTE — Telephone Encounter (Signed)
Done

## 2022-07-19 ENCOUNTER — Encounter: Payer: Self-pay | Admitting: Family Medicine

## 2022-07-20 NOTE — Telephone Encounter (Signed)
Noted  

## 2022-08-03 ENCOUNTER — Ambulatory Visit (INDEPENDENT_AMBULATORY_CARE_PROVIDER_SITE_OTHER): Payer: Commercial Managed Care - HMO | Admitting: Family Medicine

## 2022-08-03 ENCOUNTER — Encounter: Payer: Self-pay | Admitting: Family Medicine

## 2022-08-03 VITALS — BP 110/70 | HR 81 | Temp 98.4°F | Ht 67.0 in | Wt 124.0 lb

## 2022-08-03 DIAGNOSIS — F419 Anxiety disorder, unspecified: Secondary | ICD-10-CM

## 2022-08-03 DIAGNOSIS — F32A Depression, unspecified: Secondary | ICD-10-CM

## 2022-08-03 MED ORDER — DIAZEPAM 10 MG PO TABS: 10.0000 mg | ORAL_TABLET | Freq: Two times a day (BID) | ORAL | 2 refills | Status: DC | PRN

## 2022-08-03 NOTE — Progress Notes (Signed)
   Subjective:    Patient ID: [name], female    DOB: 01-Aug-1966, 56 y.o.   MRN: [id number]  HPI Here to discuss her anxiety. She had been taking one 10 mg Diazepam and one 5 mg Diazepam every day, but her stress levels have been very high lately. She is in the middle of several job interviews, and she badly needs a good job. She has increased the Diazepam to taking a total of 10 mg twice daily. She feels and functions well at this dosage.    Review of Systems  Constitutional: Negative.   Respiratory: Negative.    Cardiovascular: Negative.   Psychiatric/Behavioral:  Positive for decreased concentration. Negative for agitation, behavioral problems, confusion, dysphoric mood and hallucinations. The patient is nervous/anxious.        Objective:   Physical Exam Constitutional:      Appearance: Normal appearance.  Cardiovascular:     Rate and Rhythm: Normal rate and regular rhythm.     Pulses: Normal pulses.     Heart sounds: Normal heart sounds.  Pulmonary:     Effort: Pulmonary effort is normal.     Breath sounds: Normal breath sounds.  Neurological:     Mental Status: She is alert.  Psychiatric:        Behavior: Behavior normal.        Thought Content: Thought content normal.     Comments: She is anxious            Assessment & Plan:  For the anxiety, we will increase the Diaxepam to 10 mg BID. Recheck as needed.  Gershon Crane, MD

## 2022-08-05 ENCOUNTER — Ambulatory Visit: Payer: No Typology Code available for payment source | Admitting: Family Medicine

## 2022-08-15 ENCOUNTER — Telehealth: Payer: Commercial Managed Care - HMO | Admitting: Family Medicine

## 2022-08-26 DIAGNOSIS — Z419 Encounter for procedure for purposes other than remedying health state, unspecified: Secondary | ICD-10-CM | POA: Diagnosis not present

## 2022-09-26 DIAGNOSIS — Z419 Encounter for procedure for purposes other than remedying health state, unspecified: Secondary | ICD-10-CM | POA: Diagnosis not present

## 2022-09-27 ENCOUNTER — Ambulatory Visit: Payer: Commercial Managed Care - HMO | Admitting: Mental Health

## 2022-10-20 ENCOUNTER — Other Ambulatory Visit: Payer: Self-pay | Admitting: Family Medicine

## 2022-10-24 ENCOUNTER — Encounter: Payer: Self-pay | Admitting: Family Medicine

## 2022-10-24 ENCOUNTER — Ambulatory Visit (INDEPENDENT_AMBULATORY_CARE_PROVIDER_SITE_OTHER): Payer: Medicaid Other | Admitting: Family Medicine

## 2022-10-24 VITALS — BP 96/74 | HR 70 | Temp 98.8°F | Wt 125.0 lb

## 2022-10-24 DIAGNOSIS — J019 Acute sinusitis, unspecified: Secondary | ICD-10-CM

## 2022-10-24 MED ORDER — DIAZEPAM 10 MG PO TABS: 10.0000 mg | ORAL_TABLET | Freq: Two times a day (BID) | ORAL | 5 refills | Status: DC | PRN

## 2022-10-24 MED ORDER — AMOXICILLIN-POT CLAVULANATE 875-125 MG PO TABS: 1.0000 | ORAL_TABLET | Freq: Two times a day (BID) | ORAL | 0 refills | Status: DC

## 2022-10-24 NOTE — Progress Notes (Signed)
   Subjective:    Patient ID: AZUREE MINISH, female    DOB: [birth year], 57 y.o.   MRN: [id number]  HPI Here for 3 weeks of sinus pressure, PND, facial pain, and a dry cough. No fever. Using Mucinex DM and Robitussin. Drinking fluids. She has not had to use her inhaler so far.    Review of Systems  Constitutional: Negative.   HENT:  Positive for congestion, postnasal drip and sinus pressure. Negative for ear pain and sore throat.   Eyes: Negative.   Respiratory:  Positive for cough. Negative for shortness of breath and wheezing.        Objective:   Physical Exam Constitutional:      Appearance: Normal appearance.  HENT:     Right Ear: Tympanic membrane, ear canal and external ear normal.     Left Ear: Tympanic membrane, ear canal and external ear normal.     Nose: Nose normal.     Mouth/Throat:     Pharynx: Oropharynx is clear.  Eyes:     Conjunctiva/sclera: Conjunctivae normal.  Pulmonary:     Effort: Pulmonary effort is normal.     Breath sounds: Normal breath sounds.  Lymphadenopathy:     Cervical: No cervical adenopathy.  Skin:    Coloration: Skin is jaundiced.  Neurological:     Mental Status: She is alert.           Assessment & Plan:  Sinusitis, treat with 10 days of Augmentin.  Alysia Penna, MD

## 2022-10-27 DIAGNOSIS — Z419 Encounter for procedure for purposes other than remedying health state, unspecified: Secondary | ICD-10-CM | POA: Diagnosis not present

## 2022-11-25 DIAGNOSIS — Z419 Encounter for procedure for purposes other than remedying health state, unspecified: Secondary | ICD-10-CM | POA: Diagnosis not present

## 2022-12-26 DIAGNOSIS — Z419 Encounter for procedure for purposes other than remedying health state, unspecified: Secondary | ICD-10-CM | POA: Diagnosis not present

## 2023-01-06 ENCOUNTER — Encounter: Payer: Self-pay | Admitting: Adult Health

## 2023-01-06 ENCOUNTER — Ambulatory Visit (INDEPENDENT_AMBULATORY_CARE_PROVIDER_SITE_OTHER): Payer: Medicaid Other | Admitting: Adult Health

## 2023-01-06 VITALS — BP 110/70 | HR 85 | Temp 97.6°F | Ht 67.0 in | Wt 129.0 lb

## 2023-01-06 DIAGNOSIS — G43809 Other migraine, not intractable, without status migrainosus: Secondary | ICD-10-CM

## 2023-01-06 MED ORDER — RIZATRIPTAN BENZOATE 10 MG PO TBDP: 10.0000 mg | ORAL_TABLET | ORAL | 0 refills | Status: DC | PRN

## 2023-01-06 MED ORDER — PROMETHAZINE HCL 25 MG PO TABS: 25.0000 mg | ORAL_TABLET | Freq: Three times a day (TID) | ORAL | 0 refills | Status: DC | PRN

## 2023-01-06 NOTE — Progress Notes (Signed)
Subjective:    Patient ID: [name], female    DOB: [birth year], 57 y.o.   MRN: [id number]  Insomnia   57 year old female who  has a past medical history of Anxiety, Asthma, Headache, and Pelvic pain.  She is a patient of Dr. Clent Ridges who I am seeing today for migraine headache. She reports having a lot of stress, moving back to [place] from [place], [place] and living with her mom. She woke up with a migraine headache. She has a history of migraine headache and takes Maxalt PRN. Associated symptoms include that of nausea, aura, and light sensitivity.   She needs a refill of Maxalt and Phenergyn that she uses for nausea.   Also needs a work note     Review of Systems  Psychiatric/Behavioral:  The patient has insomnia.    See HPI   Past Medical History:  Diagnosis Date   Anxiety    Asthma    seasonal   Headache    Pelvic pain     Social History   Socioeconomic History   Marital status: Divorced    Spouse name: Not on file   Number of children: Not on file   Years of education: Not on file   Highest education level: Not on file  Occupational History   Not on file  Tobacco Use   Smoking status: Never   Smokeless tobacco: Never  Vaping Use   Vaping Use: Never used  Substance and Sexual Activity   Alcohol use: Not Currently    Alcohol/week: 1.0 standard drink of alcohol    Types: 1 Glasses of wine per week   Drug use: No   Sexual activity: Not Currently  Other Topics Concern   Not on file  Social History Narrative   Not on file   Social Determinants of Health   Financial Resource Strain: Not on file  Food Insecurity: Not on file  Transportation Needs: Not on file  Physical Activity: Not on file  Stress: Not on file  Social Connections: Not on file  Intimate Partner Violence: Not on file    Past Surgical History:  Procedure Laterality Date   ABDOMINAL EXPOSURE N/A 01/30/2019   Procedure: ABDOMINAL EXPOSURE;  Surgeon: Nada Libman, MD;   Location: [facility] OR;  Service: Vascular;  Laterality: N/A;   ANTERIOR LUMBAR FUSION N/A 01/30/2019   Procedure: Anterior Lumbar Interbody Fusion L5-S1;  Surgeon: Venita Lick, MD;  Location: [facility] OR;  Service: Orthopedics;  Laterality: N/A;    CERVICAL BIOPSY  W/ LOOP ELECTRODE EXCISION     NOV 2014   CHOLECYSTECTOMY     LAPAROSCOPIC SALPINGO OOPHERECTOMY Bilateral 03/16/2017   Procedure: LAPAROSCOPIC SALPINGO OOPHORECTOMY;  Surgeon: Dara Lords, MD;  Location: [facility];  Service: Gynecology;  Laterality: Bilateral;   OPEN REDUCTION INTERNAL FIXATION (ORIF) DISTAL RADIAL FRACTURE Right 05/02/2018   Procedure: OPEN REDUCTION INTERNAL FIXATION (ORIF) DISTAL RADIAL FRACTURE;  Surgeon: Bradly Bienenstock, MD;  Location: [facility] OR;  Service: Orthopedics;  Laterality: Right;   PELVIC LAPAROSCOPY      Family History  Problem Relation Age of Onset   Hypertension Mother    Anxiety disorder Mother    Depression Mother    Breast cancer Other        GREAT AUNT   ADD / ADHD Father     No Known Allergies  Current Outpatient Medications on File Prior to Visit  Medication Sig Dispense Refill   albuterol (VENTOLIN HFA)  108 (90 Base) MCG/ACT inhaler INHALE 2 PUFFS INTO THE LUNGS EVERY 4 HOURS AS NEEDED FOR WHEEZING OR SHORTNESS OF BREATH. 6.7 each 4   amoxicillin-clavulanate (AUGMENTIN) 875-125 MG tablet Take 1 tablet by mouth 2 (two) times daily. 20 tablet 0   diazepam (VALIUM) 10 MG tablet Take 1 tablet (10 mg total) by mouth every 12 (twelve) hours as needed for anxiety. 60 tablet 5   fluticasone (FLONASE) 50 MCG/ACT nasal spray SPRAY 2 SPRAYS INTO EACH NOSTRIL EVERY DAY 48 mL 2   Fluticasone Propionate, Inhal, (FLOVENT DISKUS) 100 MCG/BLIST AEPB Take 1 puff by mouth every day 180 each 1   ibuprofen (ADVIL) 800 MG tablet TAKE 1 TABLET BY MOUTH EVERY 8 HOURS AS NEEDED FOR MODERATE PAIN. 90 tablet 1   montelukast (SINGULAIR) 10 MG tablet TAKE 1 TABLET BY MOUTH EVERYDAY AT BEDTIME 90  tablet 3   rizatriptan (MAXALT-MLT) 10 MG disintegrating tablet Take 1 tablet (10 mg total) by mouth as needed for migraine. *appointment required for future refills* 12 tablet 0   traMADol (ULTRAM) 50 MG tablet Take 50 mg by mouth 3 (three) times daily as needed.     valACYclovir (VALTREX) 500 MG tablet TAKE 1 TABLET BY MOUTH TWICE A DAY 180 tablet 3   No current facility-administered medications on file prior to visit.    BP 110/70   Temp 97.6 F (36.4 C) (Oral)   Ht 5\' 7"  (1.702 m)   Wt 129 lb (58.5 kg)   LMP 02/19/2009 (LMP Unknown)   BMI 20.20 kg/m       Objective:   Physical Exam Vitals and nursing note reviewed.  Constitutional:      Appearance: Normal appearance.  Eyes:     General:        Right eye: No discharge.        Left eye: No discharge.     Extraocular Movements: Extraocular movements intact.     Conjunctiva/sclera: Conjunctivae normal.     Pupils: Pupils are equal, round, and reactive to light.  Cardiovascular:     Rate and Rhythm: Normal rate and regular rhythm.     Pulses: Normal pulses.     Heart sounds: Normal heart sounds.  Pulmonary:     Effort: Pulmonary effort is normal.     Breath sounds: Normal breath sounds.  Musculoskeletal:        General: Normal range of motion.  Skin:    General: Skin is warm and dry.  Neurological:     General: No focal deficit present.     Mental Status: She is alert and oriented to person, place, and time.  Psychiatric:        Mood and Affect: Mood normal.        Behavior: Behavior normal.        Thought Content: Thought content normal.        Judgment: Judgment normal.       Assessment & Plan:   1. Other migraine without status migrainosus, not intractable  - promethazine (PHENERGAN) 25 MG tablet; Take 1 tablet (25 mg total) by mouth every 8 (eight) hours as needed for nausea or vomiting.  Dispense: 20 tablet; Refill: 0 - rizatriptan (MAXALT-MLT) 10 MG disintegrating tablet; Take 1 tablet (10 mg total) by  mouth as needed for migraine. *appointment required for future refills*  Dispense: 12 tablet; Refill: 0 - Work note provided today  Shirline Frees, NP

## 2023-01-25 DIAGNOSIS — Z419 Encounter for procedure for purposes other than remedying health state, unspecified: Secondary | ICD-10-CM | POA: Diagnosis not present

## 2023-02-07 ENCOUNTER — Other Ambulatory Visit: Payer: Self-pay | Admitting: Adult Health

## 2023-02-07 DIAGNOSIS — G43809 Other migraine, not intractable, without status migrainosus: Secondary | ICD-10-CM

## 2023-02-25 DIAGNOSIS — Z419 Encounter for procedure for purposes other than remedying health state, unspecified: Secondary | ICD-10-CM | POA: Diagnosis not present

## 2023-03-14 ENCOUNTER — Telehealth: Payer: 59 | Admitting: Physician Assistant

## 2023-03-14 DIAGNOSIS — R3989 Other symptoms and signs involving the genitourinary system: Secondary | ICD-10-CM | POA: Diagnosis not present

## 2023-03-14 MED ORDER — PHENAZOPYRIDINE HCL 100 MG PO TABS: 100.0000 mg | ORAL_TABLET | Freq: Three times a day (TID) | ORAL | 0 refills | Status: DC | PRN

## 2023-03-14 MED ORDER — CEPHALEXIN 500 MG PO CAPS: 500.0000 mg | ORAL_CAPSULE | Freq: Two times a day (BID) | ORAL | 0 refills | Status: DC

## 2023-03-14 NOTE — Patient Instructions (Signed)
[name], thank you for joining Piedad Climes, PA-C for today's virtual visit.  While this provider is not your primary care provider (PCP), if your PCP is located in our provider database this encounter information will be shared with them immediately following your visit.   A [facility] MyChart account gives you access to today's visit and all your visits, tests, and labs performed at [facility] " click here if you don't have a [facility] MyChart account or go to [email or web address]  Consent: (Patient) [name] provided verbal consent for this virtual visit at the beginning of the encounter.  Current Medications:  Current Outpatient Medications:    albuterol (VENTOLIN HFA) 108 (90 Base) MCG/ACT inhaler, INHALE 2 PUFFS INTO THE LUNGS EVERY 4 HOURS AS NEEDED FOR WHEEZING OR SHORTNESS OF BREATH., Disp: 6.7 each, Rfl: 4   cephALEXin (KEFLEX) 500 MG capsule, Take 1 capsule (500 mg total) by mouth 2 (two) times daily for 7 days., Disp: 14 capsule, Rfl: 0   diazepam (VALIUM) 10 MG tablet, Take 1 tablet (10 mg total) by mouth every 12 (twelve) hours as needed for anxiety., Disp: 60 tablet, Rfl: 5   fluticasone (FLONASE) 50 MCG/ACT nasal spray, SPRAY 2 SPRAYS INTO EACH NOSTRIL EVERY DAY, Disp: 48 mL, Rfl: 2   Fluticasone Propionate, Inhal, (FLOVENT DISKUS) 100 MCG/BLIST AEPB, Take 1 puff by mouth every day, Disp: 180 each, Rfl: 1   ibuprofen (ADVIL) 800 MG tablet, TAKE 1 TABLET BY MOUTH EVERY 8 HOURS AS NEEDED FOR MODERATE PAIN., Disp: 90 tablet, Rfl: 1   montelukast (SINGULAIR) 10 MG tablet, TAKE 1 TABLET BY MOUTH EVERYDAY AT BEDTIME, Disp: 90 tablet, Rfl: 3   phenazopyridine (PYRIDIUM) 100 MG tablet, Take 1 tablet (100 mg total) by mouth 3 (three) times daily as needed for pain., Disp: 12 tablet, Rfl: 0   promethazine (PHENERGAN) 25 MG tablet, Take 1 tablet (25 mg total) by mouth every 8 (eight) hours as needed for nausea or vomiting., Disp: 20  tablet, Rfl: 0   rizatriptan (MAXALT-MLT) 10 MG disintegrating tablet, TAKE 1 TABLET BY MOUTH AS NEEDED FOR MIGRAINE. *APPOINTMENT REQUIRED FOR FUTURE REFILLS*, Disp: 12 tablet, Rfl: 0   traMADol (ULTRAM) 50 MG tablet, Take 50 mg by mouth 3 (three) times daily as needed., Disp: , Rfl:    valACYclovir (VALTREX) 500 MG tablet, TAKE 1 TABLET BY MOUTH TWICE A DAY, Disp: 180 tablet, Rfl: 3   Medications ordered in this encounter:  Meds ordered this encounter  Medications   DISCONTD: cephALEXin (KEFLEX) 500 MG capsule    Sig: Take 1 capsule (500 mg total) by mouth 2 (two) times daily for 7 days.    Dispense:  14 capsule    Refill:  0    Order Specific Question:   Supervising Provider    Answer:   Merrilee Jansky [[id number]]   cephALEXin (KEFLEX) 500 MG capsule    Sig: Take 1 capsule (500 mg total) by mouth 2 (two) times daily for 7 days.    Dispense:  14 capsule    Refill:  0    Order Specific Question:   Supervising Provider    Answer:   Merrilee Jansky [[id number]]   DISCONTD: phenazopyridine (PYRIDIUM) 100 MG tablet    Sig: Take 1 tablet (100 mg total) by mouth 3 (three) times daily as needed for pain.    Dispense:  12 tablet    Refill:  0    Order Specific Question:   Supervising  Provider    Answer:   Merrilee Jansky [[id number]]   phenazopyridine (PYRIDIUM) 100 MG tablet    Sig: Take 1 tablet (100 mg total) by mouth 3 (three) times daily as needed for pain.    Dispense:  12 tablet    Refill:  0    Order Specific Question:   Supervising Provider    Answer:   Merrilee Jansky [id number]     *If you need refills on other medications prior to your next appointment, please contact your pharmacy*  Follow-Up: Call back or seek an in-person evaluation if the symptoms worsen or if the condition fails to improve as anticipated.  [facility] [phone]  Other Instructions Your symptoms are consistent with a bladder infection, also called acute cystitis. Please take your  antibiotic (Keflex) as directed until all pills are gone.  Stay very well hydrated.  Consider a daily probiotic (Align, Culturelle, or Activia) to help prevent stomach upset caused by the antibiotic.  Taking a probiotic daily may also help prevent recurrent UTIs.  I have also sent in the prescription strength pyridium.   Urinary Tract Infection A urinary tract infection (UTI) can occur any place along the urinary tract. The tract includes the kidneys, ureters, bladder, and urethra. A type of germ called bacteria often causes a UTI. UTIs are often helped with antibiotic medicine.  HOME CARE  If given, take antibiotics as told by your doctor. Finish them even if you start to feel better. Drink enough fluids to keep your pee (urine) clear or pale yellow. Avoid tea, drinks with caffeine, and bubbly (carbonated) drinks. Pee often. Avoid holding your pee in for a long time. Pee before and after having sex (intercourse). Wipe from front to back after you poop (bowel movement) if you are a woman. Use each tissue only once. GET HELP RIGHT AWAY IF:  You have back pain. You have lower belly (abdominal) pain. You have chills. You feel sick to your stomach (nauseous). You throw up (vomit). Your burning or discomfort with peeing does not go away. You have a fever. Your symptoms are not better in 3 days. MAKE SURE YOU:  Understand these instructions. Will watch your condition. Will get help right away if you are not doing well or get worse. Document Released: 02/29/2008 Document Revised: 06/06/2012 Document Reviewed: 04/12/2012 [facility] Patient Information 2015 [place], [place]. This information is not intended to replace advice given to you by your health care provider. Make sure you discuss any questions you have with your health care provider.    If you have been instructed to have an in-person evaluation today at a local Urgent Care facility, please use the link below. It will take you to a list of  all of our available [facility], including address, phone number and hours of operation. Please do not delay care.  [facility]  If you or a family member do not have a primary care provider, use the link below to schedule a visit and establish care. When you choose a [facility] primary care physician or advanced practice provider, you gain a long-term partner in health. Find a Primary Care Provider  Learn more about [facility] in-office and virtual care options: [facility] - Get Care Now

## 2023-03-14 NOTE — Progress Notes (Signed)
Virtual Visit Consent   [name], you are scheduled for a virtual visit with a [facility] provider today. Just as with appointments in the office, your consent must be obtained to participate. Your consent will be active for this visit and any virtual visit you may have with one of our providers in the next 365 days. If you have a MyChart account, a copy of this consent can be sent to you electronically.  As this is a virtual visit, video technology does not allow for your provider to perform a traditional examination. This may limit your provider's ability to fully assess your condition. If your provider identifies any concerns that need to be evaluated in person or the need to arrange testing (such as labs, EKG, etc.), we will make arrangements to do so. Although advances in technology are sophisticated, we cannot ensure that it will always work on either your end or our end. If the connection with a video visit is poor, the visit may have to be switched to a telephone visit. With either a video or telephone visit, we are not always able to ensure that we have a secure connection.  By engaging in this virtual visit, you consent to the provision of healthcare and authorize for your insurance to be billed (if applicable) for the services provided during this visit. Depending on your insurance coverage, you may receive a charge related to this service.  I need to obtain your verbal consent now. Are you willing to proceed with your visit today? [name] has provided verbal consent on 03/14/2023 for a virtual visit (video or telephone). Piedad Climes, [place]  Date: 03/14/2023 5:21 PM  Virtual Visit via Video Note   I, Piedad Climes, PA-C, attempted to connect with [name]; MRN [id number] on 03/14/23 via [facility] to complete a video urgent care visit. The patient was unable to successfully connect to the video platform. As such, the patient was contacted  by this provider via phone to complete the encounter.    Location: Patient: Virtual Visit Location Patient: Home Provider: Virtual Visit Location Provider: Home Office   I discussed the limitations of evaluation and management by telemedicine and the availability of in person appointments. The patient expressed understanding and agreed to proceed.    History of Present Illness: [name] is a 57 y.o. who identifies as a female who was assigned female at birth, and is being seen today for possible UTI. Endorses over the past week having odorous and cloudy urine, now with urgency, frequency, hesitancy. Denies fever, chills. Denies flank pain, nausea or vomiting.   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   Migraines 01/07/2020   Low back pain 01/30/2019   Environmental allergies 12/06/2017   Asthma 12/06/2017   Depression, recurrent (HCC) 07/10/2017   Anxiety and depression 07/10/2017   Bilateral ovarian cysts 06/13/2017   Herpes simplex 06/13/2017    Allergies: No Known Allergies Medications:  Current Outpatient Medications:    cephALEXin (KEFLEX) 500 MG capsule, Take 1 capsule (500 mg total) by mouth 2 (two) times daily for 7 days., Disp: 14 capsule, Rfl: 0   albuterol (VENTOLIN HFA) 108 (90 Base) MCG/ACT inhaler, INHALE 2 PUFFS INTO THE LUNGS EVERY 4 HOURS AS NEEDED FOR WHEEZING OR SHORTNESS OF BREATH., Disp: 6.7 each, Rfl: 4   diazepam (VALIUM) 10 MG tablet,  Take 1 tablet (10 mg total) by mouth every 12 (twelve) hours as needed for anxiety., Disp: 60 tablet, Rfl: 5   fluticasone (FLONASE) 50 MCG/ACT nasal spray, SPRAY 2 SPRAYS INTO EACH NOSTRIL EVERY DAY, Disp: 48 mL, Rfl: 2   Fluticasone Propionate, Inhal, (FLOVENT DISKUS) 100 MCG/BLIST AEPB, Take 1 puff by mouth every day, Disp: 180 each, Rfl: 1   ibuprofen (ADVIL) 800 MG tablet, TAKE 1  TABLET BY MOUTH EVERY 8 HOURS AS NEEDED FOR MODERATE PAIN., Disp: 90 tablet, Rfl: 1   montelukast (SINGULAIR) 10 MG tablet, TAKE 1 TABLET BY MOUTH EVERYDAY AT BEDTIME, Disp: 90 tablet, Rfl: 3   promethazine (PHENERGAN) 25 MG tablet, Take 1 tablet (25 mg total) by mouth every 8 (eight) hours as needed for nausea or vomiting., Disp: 20 tablet, Rfl: 0   rizatriptan (MAXALT-MLT) 10 MG disintegrating tablet, TAKE 1 TABLET BY MOUTH AS NEEDED FOR MIGRAINE. *APPOINTMENT REQUIRED FOR FUTURE REFILLS*, Disp: 12 tablet, Rfl: 0   traMADol (ULTRAM) 50 MG tablet, Take 50 mg by mouth 3 (three) times daily as needed., Disp: , Rfl:    valACYclovir (VALTREX) 500 MG tablet, TAKE 1 TABLET BY MOUTH TWICE A DAY, Disp: 180 tablet, Rfl: 3  Observations/Objective: No labored breathing.  Speech is clear and coherent with logical content.  Patient is alert and oriented at baseline.   Assessment and Plan: 1. Suspected UTI - cephALEXin (KEFLEX) 500 MG capsule; Take 1 capsule (500 mg total) by mouth 2 (two) times daily for 7 days.  Dispense: 14 capsule; Refill: 0  Classic UTI symptoms with absence of alarm signs or symptoms. Prior history of UTI. Will treat empirically with Keflex for suspected uncomplicated cystitis. Supportive measures and OTC medications reviewed. Strict in-person evaluation precautions discussed.    Follow Up Instructions: I discussed the assessment and treatment plan with the patient. The patient was provided an opportunity to ask questions and all were answered. The patient agreed with the plan and demonstrated an understanding of the instructions.  A copy of instructions were sent to the patient via MyChart unless otherwise noted below.   The patient was advised to call back or seek an in-person evaluation if the symptoms worsen or if the condition fails to improve as anticipated.  Time:  I spent 10 minutes with the patient via telehealth technology discussing the above problems/concerns.     Piedad Climes, PA-C

## 2023-03-17 ENCOUNTER — Encounter: Payer: Self-pay | Admitting: Family Medicine

## 2023-03-17 ENCOUNTER — Ambulatory Visit (INDEPENDENT_AMBULATORY_CARE_PROVIDER_SITE_OTHER): Payer: 59

## 2023-03-17 ENCOUNTER — Ambulatory Visit (INDEPENDENT_AMBULATORY_CARE_PROVIDER_SITE_OTHER): Payer: 59 | Admitting: Family Medicine

## 2023-03-17 VITALS — BP 108/78 | HR 75 | Temp 98.0°F | Wt 133.6 lb

## 2023-03-17 DIAGNOSIS — S8002XA Contusion of left knee, initial encounter: Secondary | ICD-10-CM | POA: Diagnosis not present

## 2023-03-17 DIAGNOSIS — J4520 Mild intermittent asthma, uncomplicated: Secondary | ICD-10-CM | POA: Diagnosis not present

## 2023-03-17 DIAGNOSIS — N390 Urinary tract infection, site not specified: Secondary | ICD-10-CM

## 2023-03-17 DIAGNOSIS — J3089 Other allergic rhinitis: Secondary | ICD-10-CM | POA: Diagnosis not present

## 2023-03-17 LAB — POC URINALSYSI DIPSTICK (AUTOMATED)
Blood, UA: NEGATIVE
Glucose, UA: NEGATIVE
Ketones, UA: NEGATIVE
Nitrite, UA: POSITIVE
Protein, UA: NEGATIVE
Spec Grav, UA: <=1.005 — AB (ref 1.010–1.025)
Urobilinogen, UA: 2 E.U./dL — AB
pH, UA: 6.5 (ref 5.0–8.0)

## 2023-03-17 MED ORDER — FLUTICASONE PROPIONATE 50 MCG/ACT NA SUSP: NASAL | 3 refills | Status: DC

## 2023-03-17 MED ORDER — ALBUTEROL SULFATE HFA 108 (90 BASE) MCG/ACT IN AERS: 2.0000 | INHALATION_SPRAY | RESPIRATORY_TRACT | 11 refills | Status: DC | PRN

## 2023-03-17 MED ORDER — CIPROFLOXACIN HCL 500 MG PO TABS: 500.0000 mg | ORAL_TABLET | Freq: Two times a day (BID) | ORAL | 0 refills | Status: DC

## 2023-03-17 MED ORDER — MONTELUKAST SODIUM 10 MG PO TABS
ORAL_TABLET | ORAL | 3 refills | Status: DC
Start: 2023-03-17 — End: 2024-04-03

## 2023-03-17 MED ORDER — FLUTICASONE PROPIONATE DISKUS 100 MCG/ACT IN AEPB: 1.0000 | INHALATION_SPRAY | Freq: Every day | RESPIRATORY_TRACT | 11 refills | Status: DC

## 2023-03-17 NOTE — Progress Notes (Signed)
   Subjective:    Patient ID: [name], female    DOB: 09-15-1966, 57 y.o.   MRN: [id number]  HPI Here for 2 issues. First she began to have urgency to urinate with burning about one week ago. No back pain or fever. She had a video visit for this on 03-14-23, and a UTI was diagnosed. She was started on a course of Keflex 500 mg BID, but she has not improved at all. In addition, she had a Workers Comp injury on 03-13-23 when the heel of her shoe was caught on the edge of a rug, causing her to fall forward on her left knee. The knee swelled up immediately, but most of the swelling is gone now. She has had pain in the anterior knee, but she has been able to walk on it. In fact she continued to work until today. She has been wearing a knee sleeve and taking Ibuprofen. The Workers Comp nurse told her that she could see [place] for this injury.    Review of Systems  Constitutional: Negative.   Respiratory: Negative.    Cardiovascular: Negative.   Gastrointestinal: Negative.   Genitourinary:  Positive for dysuria, frequency and urgency. Negative for flank pain and hematuria.  Musculoskeletal:  Positive for arthralgias.       Objective:   Physical Exam Constitutional:      General: She is not in acute distress.    Appearance: Normal appearance.     Comments: She walks normally   Cardiovascular:     Rate and Rhythm: Normal rate and regular rhythm.     Pulses: Normal pulses.     Heart sounds: Normal heart sounds.  Pulmonary:     Effort: Pulmonary effort is normal.     Breath sounds: Normal breath sounds.  Abdominal:     Tenderness: There is no right CVA tenderness or left CVA tenderness.  Musculoskeletal:     Comments: The left knee shows no swelling. There is an ecchymosis on the anterior knee just lateral to the tibial tubercle. This area is tender. There is no joint space tenderness, and she has full ROM.   Neurological:     Mental Status: She is alert.           Assessment &  Plan:  She has a partially treated UTI. We will culture the sample. She will stop the Keflex and instead take 7 days of Cipro 500 mg BID. She also has a contusion to the right knee. This should heal on its own over the next week or two. She can apply ice and take Ibuprofen as needed. We will get Xrays of the knee today.  Gershon Crane, MD

## 2023-03-19 ENCOUNTER — Encounter: Payer: Self-pay | Admitting: Family Medicine

## 2023-03-19 LAB — URINE CULTURE
MICRO NUMBER:: 15112604
Result:: NO GROWTH
SPECIMEN QUALITY:: ADEQUATE

## 2023-03-20 ENCOUNTER — Other Ambulatory Visit (HOSPITAL_COMMUNITY): Payer: Self-pay

## 2023-03-22 ENCOUNTER — Encounter: Payer: Self-pay | Admitting: Family Medicine

## 2023-03-27 DIAGNOSIS — Z419 Encounter for procedure for purposes other than remedying health state, unspecified: Secondary | ICD-10-CM | POA: Diagnosis not present

## 2023-03-29 ENCOUNTER — Telehealth: Payer: Self-pay

## 2023-03-29 NOTE — Telephone Encounter (Signed)
Chart review completed for patient. Patient is due for screening mammogram. Mychart message sent to patient to inquire about scheduling mammogram.  Melena Hayes, Population Health Specialist.  

## 2023-03-31 ENCOUNTER — Other Ambulatory Visit: Payer: Self-pay | Admitting: Family Medicine

## 2023-03-31 ENCOUNTER — Ambulatory Visit: Payer: 59 | Admitting: Family Medicine

## 2023-04-03 ENCOUNTER — Ambulatory Visit: Payer: 59 | Admitting: Mental Health

## 2023-04-14 DIAGNOSIS — F331 Major depressive disorder, recurrent, moderate: Secondary | ICD-10-CM | POA: Diagnosis not present

## 2023-04-14 DIAGNOSIS — F411 Generalized anxiety disorder: Secondary | ICD-10-CM | POA: Diagnosis not present

## 2023-04-18 ENCOUNTER — Ambulatory Visit (INDEPENDENT_AMBULATORY_CARE_PROVIDER_SITE_OTHER): Payer: 59 | Admitting: Family Medicine

## 2023-04-18 ENCOUNTER — Encounter: Payer: Self-pay | Admitting: Family Medicine

## 2023-04-18 VITALS — BP 100/70 | HR 80 | Temp 98.7°F | Wt 130.6 lb

## 2023-04-18 DIAGNOSIS — F419 Anxiety disorder, unspecified: Secondary | ICD-10-CM

## 2023-04-18 DIAGNOSIS — F32A Depression, unspecified: Secondary | ICD-10-CM | POA: Diagnosis not present

## 2023-04-18 MED ORDER — BUPROPION HCL ER (XL) 150 MG PO TB24
150.0000 mg | ORAL_TABLET | Freq: Every day | ORAL | 3 refills | Status: DC
Start: 2023-04-18 — End: 2023-05-12

## 2023-04-18 NOTE — Progress Notes (Signed)
   Subjective:    Patient ID: [name], female    DOB: 12/25/1965, 57 y.o.   MRN: [id number]  HPI She has been tapering herself off Valium, and she is interested in getting back on Wellbutrin. She is talking to a therapist and they agree this would be a good idea. She is sleeping well. Her moods are stable.    Review of Systems  Constitutional: Negative.   Respiratory: Negative.    Cardiovascular: Negative.   Psychiatric/Behavioral:  Positive for dysphoric mood. Negative for agitation, behavioral problems, confusion, decreased concentration, hallucinations and sleep disturbance. The patient is nervous/anxious.        Objective:   Physical Exam Constitutional:      Appearance: Normal appearance.  Cardiovascular:     Rate and Rhythm: Normal rate and regular rhythm.     Pulses: Normal pulses.     Heart sounds: Normal heart sounds.  Pulmonary:     Effort: Pulmonary effort is normal.     Breath sounds: Normal breath sounds.  Neurological:     Mental Status: She is alert.  Psychiatric:        Mood and Affect: Mood normal.        Behavior: Behavior normal.        Thought Content: Thought content normal.           Assessment & Plan:  Anxiety and depression. She will start back on Wellbutrin XL 150 mg daily. After about 2 weeks, she plans to stop the Valium completely.  Gershon Crane, MD

## 2023-04-21 ENCOUNTER — Ambulatory Visit: Payer: 59 | Admitting: Psychiatry

## 2023-04-23 ENCOUNTER — Encounter: Payer: Self-pay | Admitting: Family Medicine

## 2023-04-23 ENCOUNTER — Telehealth: Payer: 59 | Admitting: Family Medicine

## 2023-04-23 DIAGNOSIS — U071 COVID-19: Secondary | ICD-10-CM

## 2023-04-23 DIAGNOSIS — J4520 Mild intermittent asthma, uncomplicated: Secondary | ICD-10-CM

## 2023-04-23 DIAGNOSIS — Z6820 Body mass index (BMI) 20.0-20.9, adult: Secondary | ICD-10-CM | POA: Diagnosis not present

## 2023-04-23 MED ORDER — PREDNISONE 20 MG PO TABS: 20.0000 mg | ORAL_TABLET | Freq: Two times a day (BID) | ORAL | 0 refills | Status: AC

## 2023-04-23 MED ORDER — ALBUTEROL SULFATE HFA 108 (90 BASE) MCG/ACT IN AERS: 2.0000 | INHALATION_SPRAY | Freq: Four times a day (QID) | RESPIRATORY_TRACT | 0 refills | Status: DC | PRN

## 2023-04-23 MED ORDER — BENZONATATE 200 MG PO CAPS: 200.0000 mg | ORAL_CAPSULE | Freq: Two times a day (BID) | ORAL | 0 refills | Status: DC | PRN

## 2023-04-23 NOTE — Progress Notes (Signed)
E-Visit  for Positive Covid Test Result   We are sorry you are not feeling well. We are here to help!  You have tested positive for COVID-19, meaning that you were infected with the novel coronavirus and could give the virus to others.  Most people with COVID-19 have mild illness and can recover at home without medical care. Do not leave your home, except to get medical care. Do not visit public areas and do not go to places where you are unable to wear a mask. It is important that you stay home  to take care for yourself and to help protect other people in your home and community.      Isolation Instructions:   You are to isolate at home until you have been fever free for at least 24 hours without a fever-reducing medication, and symptoms have been steadily improving for 24 hours. At that time,  you can end isolation but need to mask for an additional 5 days.  If you must be around other household members who do not have symptoms, you need to make sure that both you and the family members are masking consistently with a high-quality mask.  If you note any worsening of symptoms despite treatment, please seek an in-person evaluation ASAP. If you note any significant shortness of breath or any chest pain, please seek ER evaluation. Please do not delay care!   Go to the nearest hospital ED for assessment if fever/cough/breathlessness are severe or illness seems like a threat to life.    The following symptoms may appear 2-14 days after exposure: Fever Cough Shortness of breath or difficulty breathing Chills Repeated shaking with chills Muscle pain Headache Sore throat New loss of taste or smell Fatigue Congestion or runny nose Nausea or vomiting Diarrhea  You can use medication such as prescription cough medication called Tessalon Perles 100 mg. You may take 1-2 capsules every 8 hours as needed for cough and  prescription inhaler called Albuterol MDI 90 mcg /actuation 2 puffs every 4  hours as needed for shortness of breath, wheezing, cough  I will also send prednisone. Thank you  You may also take acetaminophen (Tylenol) as needed for fever.  HOME CARE: Only take medications as instructed by your medical team. Drink plenty of fluids and get plenty of rest. A steam or ultrasonic humidifier can help if you have congestion.   GET HELP RIGHT AWAY IF YOU HAVE EMERGENCY WARNING SIGNS.  Call 911 or proceed to your closest emergency facility if: You develop worsening high fever. Trouble breathing Bluish lips or face Persistent pain or pressure in the chest New confusion Inability to wake or stay awake You cough up blood. Your symptoms become more severe Inability to hold down food or fluids  This list is not all possible symptoms. Contact your medical provider for any symptoms that are severe or concerning to you.   Your e-visit answers were reviewed by a board certified advanced clinical practitioner to complete your personal care plan.  Depending on the condition, your plan could have included both over the counter or prescription medications.  If there is a problem please reply once you have received a response from your provider.  Your safety is important to [place].  If you have drug allergies check your prescription carefully.    You can use MyChart to ask questions about today's visit, request a non-urgent call back, or ask for a work or school excuse for 24 hours related to this e-Visit.  If it has been greater than 24 hours you will need to follow up with your provider, or enter a new e-Visit to address those concerns. You will get an e-mail in the next two days asking about your experience.  I hope that your e-visit has been valuable and will speed your recovery. Thank you for using e-visits.

## 2023-04-25 MED ORDER — NIRMATRELVIR/RITONAVIR (PAXLOVID)TABLET: 3.0000 | ORAL_TABLET | Freq: Two times a day (BID) | ORAL | 0 refills | Status: AC

## 2023-04-25 NOTE — Telephone Encounter (Signed)
Done

## 2023-04-27 ENCOUNTER — Telehealth (INDEPENDENT_AMBULATORY_CARE_PROVIDER_SITE_OTHER): Payer: 59 | Admitting: Family Medicine

## 2023-04-27 ENCOUNTER — Encounter: Payer: Self-pay | Admitting: Family Medicine

## 2023-04-27 VITALS — Temp 99.0°F | Ht 67.0 in | Wt 130.0 lb

## 2023-04-27 DIAGNOSIS — Z419 Encounter for procedure for purposes other than remedying health state, unspecified: Secondary | ICD-10-CM | POA: Diagnosis not present

## 2023-04-27 DIAGNOSIS — U071 COVID-19: Secondary | ICD-10-CM

## 2023-04-27 NOTE — Progress Notes (Signed)
Virtual Visit via Video Note  I connected withNAME@ on 04/27/23 at  1:30 PM EDT by a video enabled telemedicine application and verified that I am speaking with the correct person using two identifiers.  Location patient: home Location provider:work or home office Persons participating in the virtual visit: patient, provider  I discussed the limitations of evaluation and management by telemedicine and the availability of in person appointments. The patient expressed understanding and agreed to proceed.  Chief Complaint  Patient presents with   Covid Positive    Pt reports tested positive on Saturday . Sx of SOB, fever(99- this morning), fatigue, Mild nausea, cough- a little bit productive , body ache, headache, sore throat.  Sx started on Friday. Taking Paxlovid and tylenol, mucinex DM. Pt reports she is not getting worse or better. Work place requesting another work note.    HPI: Patient is a 57 year old female followed by Dr. Clent Ridges seen for follow-up on acute concern.  Patient began feeling sick 1 week ago with positive COVID test 5 days ago.  Patient given albuterol and prednisone via e-visit with [facility] on 04/23/2023.  Pt's PCP sent in Rx for Paxlovid that same day.  Pt states symptoms have continued, not any worse.  Endorses a dry cough, intermittent fever, body aches, chills, nausea, sore throat.  Pt has no appetite since starting paxlovid as the med makes her feel nauseous.  Has 3 more days worth of the Paxlovid.  Pt has not started the prednisone as it typically causes insomnia.  This is the first time pt has had COVID.    Pt needs a note for work.  Patient's employer is aware of her illness.  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  Currently taking a shower and walking back to the bed is exhausting.  ROS: See pertinent positives and negatives per HPI.  Past Medical History:  Diagnosis Date   Anxiety    Asthma    seasonal    Headache    Pelvic pain     Past Surgical History:  Procedure Laterality Date   ABDOMINAL EXPOSURE N/A 01/30/2019   Procedure: ABDOMINAL EXPOSURE;  Surgeon: Nada Libman, MD;  Location: [facility] OR;  Service: Vascular;  Laterality: N/A;   ANTERIOR LUMBAR FUSION N/A 01/30/2019   Procedure: Anterior Lumbar Interbody Fusion L5-S1;  Surgeon: Venita Lick, MD;  Location: [facility] OR;  Service: Orthopedics;  Laterality: N/A;    CERVICAL BIOPSY  W/ LOOP ELECTRODE EXCISION     NOV 2014   CHOLECYSTECTOMY     LAPAROSCOPIC SALPINGO OOPHERECTOMY Bilateral 03/16/2017   Procedure: LAPAROSCOPIC SALPINGO OOPHORECTOMY;  Surgeon: Dara Lords, MD;  Location: [facility];  Service: Gynecology;  Laterality: Bilateral;   OPEN REDUCTION INTERNAL FIXATION (ORIF) DISTAL RADIAL FRACTURE Right 05/02/2018   Procedure: OPEN REDUCTION INTERNAL FIXATION (ORIF) DISTAL RADIAL FRACTURE;  Surgeon: Bradly Bienenstock, MD;  Location: [facility] OR;  Service: Orthopedics;  Laterality: Right;   PELVIC LAPAROSCOPY      Family History  Problem Relation Age of Onset   Hypertension Mother    Anxiety disorder Mother    Depression Mother    Breast cancer Other        GREAT AUNT   ADD / ADHD Father     Current Outpatient Medications:    albuterol (VENTOLIN HFA) 108 (90 Base) MCG/ACT inhaler, Inhale 2 puffs into the lungs every 4 (four) hours as needed for wheezing or  shortness of breath., Disp: 6.7 each, Rfl: 11   albuterol (VENTOLIN HFA) 108 (90 Base) MCG/ACT inhaler, Inhale 2 puffs into the lungs every 6 (six) hours as needed for wheezing or shortness of breath., Disp: 8 g, Rfl: 0   buPROPion (WELLBUTRIN XL) 150 MG 24 hr tablet, Take 1 tablet (150 mg total) by mouth daily., Disp: 30 tablet, Rfl: 3   fluticasone (FLONASE) 50 MCG/ACT nasal spray, SPRAY 2 SPRAYS INTO EACH NOSTRIL EVERY DAY, Disp: 48 mL, Rfl: 3   Fluticasone Propionate, Inhal, (FLUTICASONE PROPIONATE DISKUS) 100 MCG/ACT AEPB, Inhale 1 puff into the lungs  daily., Disp: 60 each, Rfl: 11   ibuprofen (ADVIL) 800 MG tablet, TAKE 1 TABLET BY MOUTH EVERY 8 HOURS AS NEEDED FOR MODERATE PAIN. **NEED NEW INSURANCE**, Disp: 90 tablet, Rfl: 1   montelukast (SINGULAIR) 10 MG tablet, TAKE 1 TABLET BY MOUTH EVERYDAY AT BEDTIME, Disp: 90 tablet, Rfl: 3   nirmatrelvir/ritonavir (PAXLOVID) 20 x 150 MG & 10 x 100MG  TABS, Take 3 tablets by mouth 2 (two) times daily for 5 days. (Take nirmatrelvir 150 mg two tablets twice daily for 5 days and ritonavir 100 mg one tablet twice daily for 5 days) Patient GFR is 75, Disp: 30 tablet, Rfl: 0   phenazopyridine (PYRIDIUM) 100 MG tablet, Take 1 tablet (100 mg total) by mouth 3 (three) times daily as needed for pain., Disp: 12 tablet, Rfl: 0   promethazine (PHENERGAN) 25 MG tablet, Take 1 tablet (25 mg total) by mouth every 8 (eight) hours as needed for nausea or vomiting., Disp: 20 tablet, Rfl: 0   rizatriptan (MAXALT-MLT) 10 MG disintegrating tablet, TAKE 1 TABLET BY MOUTH AS NEEDED FOR MIGRAINE. *APPOINTMENT REQUIRED FOR FUTURE REFILLS*, Disp: 12 tablet, Rfl: 0   traMADol (ULTRAM) 50 MG tablet, Take 50 mg by mouth 3 (three) times daily as needed., Disp: , Rfl:    valACYclovir (VALTREX) 500 MG tablet, TAKE 1 TABLET BY MOUTH TWICE A DAY, Disp: 180 tablet, Rfl: 3   benzonatate (TESSALON) 200 MG capsule, Take 1 capsule (200 mg total) by mouth 2 (two) times daily as needed for cough. (Patient not taking: Reported on 04/27/2023), Disp: 20 capsule, Rfl: 0   predniSONE (DELTASONE) 20 MG tablet, Take 1 tablet (20 mg total) by mouth 2 (two) times daily with a meal for 5 days. (Patient not taking: Reported on 04/27/2023), Disp: 10 tablet, Rfl: 0  EXAM:  VITALS per patient if applicable: RR between 12-20 bpm  GENERAL: alert, oriented, appears well and in no acute distress  HEENT: atraumatic, conjunctiva clear, no obvious abnormalities on inspection of external nose and ears  NECK: normal movements of the head and neck  LUNGS: Intermittent  dry cough.  On inspection no signs of respiratory distress, breathing rate appears normal, no obvious gross SOB, gasping or wheezing  CV: no obvious cyanosis  MS: moves all visible extremities without noticeable abnormality  PSYCH/NEURO: pleasant and cooperative, no obvious depression or anxiety, speech and thought processing grossly intact  ASSESSMENT AND PLAN:  Discussed the following assessment and plan:  COVID-19 virus infection Patient with continued viral URI symptoms due to COVID-19 viral infection.  Will complete course of Paxlovid.  Will hold off on prednisone as it causes insomnia.  If patient does decide to start prednisone discussed a small burst of 20 mg daily x 3 days instead of how the prescription was written (20 mg twice daily x 5 days).  Continue supportive care given strict precautions.  Note given for work.  Follow-up as needed  I discussed the assessment and treatment plan with the patient. The patient was provided an opportunity to ask questions and all were answered. The patient agreed with the plan and demonstrated an understanding of the instructions.   The patient was advised to call back or seek an in-person evaluation if the symptoms worsen or if the condition fails to improve as anticipated.   Deeann Saint, MD

## 2023-05-02 ENCOUNTER — Ambulatory Visit (INDEPENDENT_AMBULATORY_CARE_PROVIDER_SITE_OTHER): Payer: 59 | Admitting: Family Medicine

## 2023-05-02 ENCOUNTER — Encounter: Payer: Self-pay | Admitting: Family Medicine

## 2023-05-02 ENCOUNTER — Telehealth: Payer: 59 | Admitting: Family Medicine

## 2023-05-02 VITALS — BP 110/82 | HR 85 | Temp 98.9°F | Wt 130.6 lb

## 2023-05-02 DIAGNOSIS — J019 Acute sinusitis, unspecified: Secondary | ICD-10-CM | POA: Diagnosis not present

## 2023-05-02 MED ORDER — AMOXICILLIN-POT CLAVULANATE 875-125 MG PO TABS: 1.0000 | ORAL_TABLET | Freq: Two times a day (BID) | ORAL | 0 refills | Status: DC

## 2023-05-02 NOTE — Progress Notes (Signed)
   Subjective:    Patient ID: [name], female    DOB: 18-Dec-1965, 57 y.o.   MRN: [id number]  HPI Here for what she thinks is a sinus infection. She had a virtual visit with Dr. Salomon Fick on 04-27-23 when she was halfway through  5 day course of Paxlovid that we had called in for her. She tested positive for Covid about 2 weeks ago. At that time she had fever, body aches, ST and a dry cough. Since then the fever and aches have resolved but she has developed sinus pressure, PND, and is blowing green mucus from the nose.    Review of Systems  Constitutional: Negative.   HENT:  Positive for congestion, postnasal drip and sinus pressure. Negative for ear pain and sore throat.   Eyes: Negative.   Respiratory:  Positive for cough. Negative for shortness of breath and wheezing.        Objective:   Physical Exam Constitutional:      Appearance: Normal appearance.  HENT:     Right Ear: Tympanic membrane, ear canal and external ear normal.     Left Ear: Tympanic membrane, ear canal and external ear normal.     Nose: Nose normal.     Mouth/Throat:     Pharynx: Oropharynx is clear.  Eyes:     Conjunctiva/sclera: Conjunctivae normal.  Pulmonary:     Effort: Pulmonary effort is normal.     Breath sounds: Normal breath sounds.  Lymphadenopathy:     Cervical: No cervical adenopathy.  Neurological:     Mental Status: She is alert.           Assessment & Plan:  She is recovering from a Covid infection, but she now has a secondary sinusitis. We will treat this with 10 days of Augmentin. Gershon Crane, MD

## 2023-05-12 ENCOUNTER — Other Ambulatory Visit: Payer: Self-pay | Admitting: Family Medicine

## 2023-05-16 ENCOUNTER — Telehealth: Payer: Self-pay | Admitting: *Deleted

## 2023-05-16 NOTE — Telephone Encounter (Signed)
Left patient a message to call back to schedule New GYN annual. Patient disconnected call while on hold for first available appointment.

## 2023-05-30 ENCOUNTER — Encounter: Payer: 59 | Admitting: Obstetrics and Gynecology

## 2023-05-30 ENCOUNTER — Telehealth: Payer: Self-pay | Admitting: *Deleted

## 2023-05-30 NOTE — Telephone Encounter (Signed)
Returned call from 9:18 AM. Left patient a message to call back about appointment per patient's message.

## 2023-06-15 ENCOUNTER — Ambulatory Visit: Payer: 59 | Admitting: Adult Health

## 2023-06-16 ENCOUNTER — Ambulatory Visit: Payer: 59 | Admitting: Adult Health

## 2023-06-20 ENCOUNTER — Other Ambulatory Visit (HOSPITAL_COMMUNITY)
Admission: RE | Admit: 2023-06-20 | Discharge: 2023-06-20 | Disposition: A | Discharge status: Home / Self Care | Payer: 59 | Source: Ambulatory Visit | Attending: Obstetrics and Gynecology | Admitting: Obstetrics and Gynecology

## 2023-06-20 ENCOUNTER — Ambulatory Visit (INDEPENDENT_AMBULATORY_CARE_PROVIDER_SITE_OTHER): Payer: 59 | Admitting: Obstetrics and Gynecology

## 2023-06-20 ENCOUNTER — Encounter: Payer: Self-pay | Admitting: Obstetrics and Gynecology

## 2023-06-20 ENCOUNTER — Encounter: Payer: Self-pay | Admitting: Family Medicine

## 2023-06-20 VITALS — BP 120/87 | HR 87 | Resp 16 | Ht 67.0 in | Wt 133.0 lb

## 2023-06-20 DIAGNOSIS — Z113 Encounter for screening for infections with a predominantly sexual mode of transmission: Secondary | ICD-10-CM | POA: Insufficient documentation

## 2023-06-20 DIAGNOSIS — R309 Painful micturition, unspecified: Secondary | ICD-10-CM | POA: Diagnosis not present

## 2023-06-20 DIAGNOSIS — Z01419 Encounter for gynecological examination (general) (routine) without abnormal findings: Secondary | ICD-10-CM | POA: Insufficient documentation

## 2023-06-20 LAB — POCT URINALYSIS DIPSTICK
Appearance: NORMAL
Bilirubin, UA: NEGATIVE
Blood, UA: NEGATIVE
Glucose, UA: NEGATIVE
Ketones, UA: NEGATIVE
Leukocytes, UA: NEGATIVE
Nitrite, UA: NEGATIVE
Protein, UA: NEGATIVE
Spec Grav, UA: 1.01 (ref 1.010–1.025)
Urobilinogen, UA: NEGATIVE E.U./dL — AB
pH, UA: 6 (ref 5.0–8.0)

## 2023-06-20 NOTE — Progress Notes (Unsigned)
ANNUAL EXAM Patient name: [name] MRN [id number]  Date of birth: 1965-11-03 Chief Complaint:   Annual Exam  History of Present Illness:   [name] is a 57 y.o. G54P0020  female being seen today for a routine annual exam.  Current complaints: HIstory of oopherectomy and salpingectomy. kept uterus and cervix, Is not sexually active.  Last abnormal ten years ago, then had few normals. 3rd time in last three months uti symptoms, Urinary symptoms fullness, frequency,   Patient's last menstrual period was 02/19/2009 (lmp unknown).   The pregnancy intention screening data noted above was reviewed. Potential methods of contraception were discussed. The patient elected to proceed with No data recorded.   Last pap ***. Results were: {Pap findings:25134}. H/O abnormal pap: {yes/yes***/no:23866} Last mammogram: reports a couple of years ago. Results were: normal. Family h/o breast cancer: {yes***/no:23838}      05/02/2023    4:01 PM 04/18/2023   11:49 AM 10/24/2022   10:48 AM 08/03/2022    4:32 PM 12/07/2020    2:49 PM  Depression screen PHQ 2/9  Decreased Interest 1 1 0 0 0  Down, Depressed, Hopeless 0 2 0 2 1  PHQ - 2 Score 1 3 0 2 1  Altered sleeping 2 2 0 2 3  Tired, decreased energy 1 1 0 0 0  Change in appetite 0 1 0 0 0  Feeling bad or failure about yourself  0 1 0 2 1  Trouble concentrating 1 2 0 1 3  Moving slowly or fidgety/restless 0 2 0 0 2  Suicidal thoughts 0 0 0 0 0  PHQ-9 Score 5 12 0 7 10  Difficult doing work/chores Somewhat difficult Somewhat difficult Not difficult at all Somewhat difficult Somewhat difficult        05/02/2023    4:01 PM 04/18/2023   11:45 AM 10/24/2022   10:48 AM 12/07/2020    2:50 PM  GAD 7 : Generalized Anxiety Score  Nervous, Anxious, on Edge 1  0 2  Control/stop worrying 1 2 0 2  Worry too much - different things 1 2 0 1  Trouble relaxing 1 1 0 2  Restless 0 0 0 1  Easily annoyed or irritable 0 0 0 0  Afraid - awful  might happen 0 0 0 0  Total GAD 7 Score 4  0 8  Anxiety Difficulty Not difficult at all Somewhat difficult Not difficult at all Somewhat difficult     Review of Systems:   Pertinent items are noted in HPI Denies any headaches, blurred vision, fatigue, shortness of breath, chest pain, abdominal pain, abnormal vaginal discharge/itching/odor/irritation, problems with periods, bowel movements, urination, or intercourse unless otherwise stated above. Pertinent History Reviewed:  Reviewed past medical,surgical, social and family history.  Reviewed problem list, medications and allergies. Physical Assessment:   Vitals:   06/20/23 1412  BP: 120/87  Pulse: 87  Resp: 16  Weight: 133 lb (60.3 kg)  Height: 5\' 7"  (1.702 m)  Body mass index is 20.83 kg/m.        Physical Examination:   General appearance - well appearing, and in no distress  Mental status - alert, oriented to person, place, and time  Psych:  She has a normal mood and affect  Skin - warm and dry, normal color, no suspicious lesions noted  Chest - effort normal, all lung fields clear to auscultation bilaterally  Heart - normal rate and regular rhythm  Neck:  midline trachea, no  thyromegaly or nodules  Breasts - breasts appear normal, no suspicious masses, no skin or nipple changes or  axillary nodes  Abdomen - soft, nontender, nondistended, no masses or organomegaly  Pelvic - VULVA: normal appearing vulva with no masses, tenderness or lesions  VAGINA: normal appearing vagina with normal color and discharge, no lesions  CERVIX: normal appearing cervix without discharge or lesions, no CMT  Thin prep pap is {Desc; done/not:10129} *** HR HPV cotesting  UTERUS: uterus is felt to be normal size, shape, consistency and nontender   ADNEXA: No adnexal masses or tenderness noted.  Rectal - normal rectal, good sphincter tone, no masses felt. Hemoccult: ***  Extremities:  No swelling or varicosities noted  Chaperone present for  exam  Results for orders placed or performed in visit on 06/20/23 (from the past 24 hour(s))  POCT Urinalysis Dipstick   Collection Time: 06/20/23  2:26 PM  Result Value Ref Range   Color, UA yellow    Clarity, UA clear    Glucose, UA Negative Negative   Bilirubin, UA neg    Ketones, UA neg    Spec Grav, UA 1.010 1.010 - 1.025   Blood, UA neg    pH, UA 6.0 5.0 - 8.0   Protein, UA Negative Negative   Urobilinogen, UA negative (A) 0.2 or 1.0 E.U./dL   Nitrite, UA neg    Leukocytes, UA Negative Negative   Appearance normal    Odor none     Assessment & Plan:  1) Well-Woman Exam  2) ***  Labs/procedures today: ***  Mammogram: {Mammo f/u:25212::"@ 55yo"}, or sooner if problems Colonoscopy: {TCS f/u:25213::"@ 55yo"}, or sooner if problems  Orders Placed This Encounter  Procedures   Urine Culture   POCT Urinalysis Dipstick    Meds: No orders of the defined types were placed in this encounter.   Follow-up: No follow-ups on file.  Albertine Grates, FNP 06/20/2023 2:40 PM

## 2023-06-21 LAB — RPR+HBSAG+HCVAB+...
HIV Screen 4th Generation wRfx: NONREACTIVE
Hep C Virus Ab: NONREACTIVE
Hepatitis B Surface Ag: NEGATIVE
RPR Ser Ql: NONREACTIVE

## 2023-06-21 NOTE — Telephone Encounter (Signed)
Urobilinogen is a breakdown product of liver enzymes that can show up in urine. Her levels are not high enough to worry about, besides this was negative yesterday. She has never had any signs of liver problems. To my knowledge, no one has asked for any of her medical information

## 2023-06-22 LAB — CERVICOVAGINAL ANCILLARY ONLY
Bacterial Vaginitis (gardnerella): NEGATIVE
Candida Glabrata: NEGATIVE
Candida Vaginitis: NEGATIVE
Comment: NEGATIVE
Comment: NEGATIVE
Comment: NEGATIVE

## 2023-06-22 LAB — URINE CULTURE

## 2023-06-27 LAB — CYTOLOGY - PAP
Comment: NEGATIVE
Diagnosis: NEGATIVE
High risk HPV: NEGATIVE

## 2023-06-29 ENCOUNTER — Other Ambulatory Visit (HOSPITAL_COMMUNITY): Payer: Self-pay

## 2023-06-30 ENCOUNTER — Other Ambulatory Visit: Payer: Self-pay | Admitting: Family Medicine

## 2023-06-30 DIAGNOSIS — Z1211 Encounter for screening for malignant neoplasm of colon: Secondary | ICD-10-CM

## 2023-06-30 DIAGNOSIS — Z1212 Encounter for screening for malignant neoplasm of rectum: Secondary | ICD-10-CM

## 2023-07-03 ENCOUNTER — Ambulatory Visit: Payer: 59 | Admitting: Family Medicine

## 2023-07-04 ENCOUNTER — Ambulatory Visit: Payer: 59 | Admitting: Family Medicine

## 2023-07-05 ENCOUNTER — Ambulatory Visit: Payer: 59 | Admitting: Family Medicine

## 2023-07-11 ENCOUNTER — Ambulatory Visit (INDEPENDENT_AMBULATORY_CARE_PROVIDER_SITE_OTHER): Payer: 59 | Admitting: Family Medicine

## 2023-07-11 ENCOUNTER — Ambulatory Visit: Payer: 59 | Admitting: Adult Health

## 2023-07-11 ENCOUNTER — Encounter: Payer: Self-pay | Admitting: Family Medicine

## 2023-07-11 VITALS — BP 110/70 | HR 85 | Temp 98.3°F | Resp 12 | Ht 67.0 in | Wt 133.4 lb

## 2023-07-11 DIAGNOSIS — J019 Acute sinusitis, unspecified: Secondary | ICD-10-CM

## 2023-07-11 DIAGNOSIS — J4520 Mild intermittent asthma, uncomplicated: Secondary | ICD-10-CM

## 2023-07-11 DIAGNOSIS — J301 Allergic rhinitis due to pollen: Secondary | ICD-10-CM

## 2023-07-11 MED ORDER — AMOXICILLIN-POT CLAVULANATE 875-125 MG PO TABS: 1.0000 | ORAL_TABLET | Freq: Two times a day (BID) | ORAL | 0 refills | Status: AC

## 2023-07-11 MED ORDER — PREDNISONE 20 MG PO TABS
40.0000 mg | ORAL_TABLET | Freq: Every day | ORAL | 0 refills | Status: AC
Start: 2023-07-11 — End: 2023-07-14

## 2023-07-11 NOTE — Progress Notes (Signed)
ACUTE VISIT Chief Complaint  Patient presents with   Sinus Problem    Sinuses and productive cough.    HPI: [name] is a 57 y.o. female with a PMHx significant for migraines, asthma, bilateral ovarian cysts, ADHD, anxiety/depression, and insomnia who is here today complaining of cough and a possible sinus infection.  She states she's been sick with cough for a week. Over the weekend, her sinuses began to hurt. She states her cough became productive in the last 24 hours. She denies any blood in the sputum. She says it is worse at night.  She would like antibiotic treatment. Treated with Augmentin in 04/2023.  Sinus Problem This is a recurrent problem. The current episode started 1 to 4 weeks ago. The problem has been gradually worsening since onset. There has been no fever. The pain is moderate. Associated symptoms include congestion, coughing and headaches. Pertinent negatives include no chills, diaphoresis, ear pain, hoarse voice, neck pain or sore throat. Past treatments include saline nose sprays. The treatment provided mild relief.   She endorses intermittent episodes of wheezing and SOB, but attributes this to her  asthma. She is taking Singulair 10 mg daily and using albuterol nhaler as needed.  She is also on fluticasone propionate inhaler and 100 mcg 1 puff daily.  She also endorses fatigue, body aches, nasal congestion, and rhinorrhea. History of seasonal allergies, she uses nasal Flonase and nasal saline irrigations.  She has had allergy testing in the past, reports several environmental allergies. Maxillofacial CT done in 11/2016 due to recurrent sinus infection was negative.  She denies fever, chills, nausea, or vomiting.  She mentions she has been around lots of people , though she doesn't know of any specific sick contacts.  She has been taking Mucinex dm, tylenol, benzonatate, and benadryl for her symptoms. She has also tried nasal sprays for her allergies.   She is concerned because she is starting a new job tomorrow and at present symptoms are getting worse.  Review of Systems  Constitutional:  Positive for fatigue. Negative for appetite change, chills and diaphoresis.  HENT:  Positive for congestion and postnasal drip. Negative for ear pain, hoarse voice and sore throat.   Respiratory:  Positive for cough.   Cardiovascular:  Negative for chest pain, palpitations and leg swelling.  Gastrointestinal:  Negative for abdominal pain.  Genitourinary:  Negative for decreased urine volume and hematuria.  Musculoskeletal:  Negative for neck pain.  Allergic/Immunologic: Positive for environmental allergies.  Neurological:  Positive for headaches. Negative for syncope and facial asymmetry.  Psychiatric/Behavioral:  The patient is nervous/anxious.   See other pertinent positives and negatives in HPI.  Current Outpatient Medications on File Prior to Visit  Medication Sig Dispense Refill   albuterol (VENTOLIN HFA) 108 (90 Base) MCG/ACT inhaler Inhale 2 puffs into the lungs every 6 (six) hours as needed for wheezing or shortness of breath. 8 g 0   buPROPion (WELLBUTRIN XL) 150 MG 24 hr tablet TAKE 1 TABLET BY MOUTH EVERY DAY 90 tablet 1   fluticasone (FLONASE) 50 MCG/ACT nasal spray SPRAY 2 SPRAYS INTO EACH NOSTRIL EVERY DAY 48 mL 3   Fluticasone Propionate, Inhal, (FLUTICASONE PROPIONATE DISKUS) 100 MCG/ACT AEPB Inhale 1 puff into the lungs daily. 60 each 11   ibuprofen (ADVIL) 800 MG tablet TAKE 1 TABLET BY MOUTH EVERY 8 HOURS AS NEEDED FOR MODERATE PAIN. **NEED NEW INSURANCE** 90 tablet 1   montelukast (SINGULAIR) 10 MG tablet TAKE 1 TABLET BY MOUTH EVERYDAY  AT BEDTIME 90 tablet 3   promethazine (PHENERGAN) 25 MG tablet Take 1 tablet (25 mg total) by mouth every 8 (eight) hours as needed for nausea or vomiting. 20 tablet 0   rizatriptan (MAXALT-MLT) 10 MG disintegrating tablet TAKE 1 TABLET BY MOUTH AS NEEDED FOR MIGRAINE. *APPOINTMENT REQUIRED FOR FUTURE  REFILLS* 12 tablet 0   valACYclovir (VALTREX) 500 MG tablet TAKE 1 TABLET BY MOUTH TWICE A DAY 180 tablet 3   No current facility-administered medications on file prior to visit.   Past Medical History:  Diagnosis Date   Anxiety    Asthma    seasonal   Headache    Pelvic pain    Allergies  Allergen Reactions   Azithromycin Hives   Social History   Socioeconomic History   Marital status: Divorced    Spouse name: Not on file   Number of children: Not on file   Years of education: Not on file   Highest education level: Associate degree: academic program  Occupational History   Not on file  Tobacco Use   Smoking status: Never   Smokeless tobacco: Never  Vaping Use   Vaping status: Never Used  Substance and Sexual Activity   Alcohol use: Not Currently    Alcohol/week: 1.0 standard drink of alcohol    Types: 1 Glasses of wine per week   Drug use: No   Sexual activity: Not Currently    Birth control/protection: Post-menopausal  Other Topics Concern   Not on file  Social History Narrative   Not on file   Social Determinants of Health   Financial Resource Strain: High Risk (03/16/2023)   Overall Financial Resource Strain (CARDIA)    Difficulty of Paying Living Expenses: Hard  Food Insecurity: Food Insecurity Present (03/16/2023)   Hunger Vital Sign    Worried About Running Out of Food in the Last Year: Sometimes true    Ran Out of Food in the Last Year: Sometimes true  Transportation Needs: No Transportation Needs (03/16/2023)   PRAPARE - Administrator, Civil Service (Medical): No    Lack of Transportation (Non-Medical): No  Physical Activity: Sufficiently Active (03/16/2023)   Exercise Vital Sign    Days of Exercise per Week: 4 days    Minutes of Exercise per Session: 60 min  Stress: Stress Concern Present (03/16/2023)   [employer] of Occupational Health - Occupational Stress Questionnaire    Feeling of Stress : Very much  Social Connections:  Moderately Integrated (03/16/2023)   Social Connection and Isolation Panel [NHANES]    Frequency of Communication with Friends and Family: More than three times a week    Frequency of Social Gatherings with Friends and Family: Twice a week    Attends Religious Services: More than 4 times per year    Active Member of [employer] or Organizations: Yes    Attends Banker Meetings: More than 4 times per year    Marital Status: Divorced   Vitals:   07/11/23 1540  BP: 110/70  Pulse: 85  Resp: 12  Temp: 98.3 F (36.8 C)  SpO2: 96%   Body mass index is 20.89 kg/m.  Physical Exam Vitals and nursing note reviewed.  Constitutional:      General: She is not in acute distress.    Appearance: She is well-developed. She is not ill-appearing.  HENT:     Head: Normocephalic and atraumatic.     Right Ear: Tympanic membrane, ear canal and external ear normal.  Left Ear: Tympanic membrane, ear canal and external ear normal.     Nose: Septal deviation and rhinorrhea present.     Right Turbinates: Enlarged.     Right Sinus: Maxillary sinus tenderness and frontal sinus tenderness present.     Left Sinus: Maxillary sinus tenderness and frontal sinus tenderness present.     Mouth/Throat:     Mouth: Mucous membranes are moist.     Pharynx: Oropharynx is clear.  Eyes:     Conjunctiva/sclera: Conjunctivae normal.  Cardiovascular:     Rate and Rhythm: Normal rate and regular rhythm.     Heart sounds: No murmur heard. Pulmonary:     Effort: Pulmonary effort is normal. No respiratory distress.     Breath sounds: Normal breath sounds. No stridor. No wheezing.  Lymphadenopathy:     Head:     Right side of head: No submandibular adenopathy.     Left side of head: No submandibular adenopathy.     Cervical: No cervical adenopathy.  Skin:    General: Skin is warm.     Findings: No erythema or rash.  Neurological:     General: No focal deficit present.     Mental Status: She is alert and  oriented to person, place, and time.     Gait: Gait normal.  Psychiatric:        Mood and Affect: Affect normal. Mood is anxious.   ASSESSMENT AND PLAN:  Ms. Antone was seen today for cough and a possible sinus infection.   Mild intermittent asthma without complication Today I do not appreciate wheezing or respiratory distress. Lung auscultation negative. Recommend prednisone 40 mg in the morning with breakfast for 3 days, we discussed some side effects. Continue Singulair 10 mg daily and fluticasone propionate 100 mcg 1 puff daily. Albuterol inh 2 puff every 6 hours for a week then as needed for wheezing or shortness of breath.   -     Ambulatory referral to Immunology -     predniSONE; Take 2 tablets (40 mg total) by mouth daily with breakfast for 3 days.  Dispense: 6 tablet; Refill: 0  Allergic rhinitis due to pollen, unspecified seasonality This problem could explain most of her symptoms. Recurrent problem, currently on OTC antihistaminic, Singulair 10 mg daily, nasal saline irrigations, and Flonase nasal spray. She may be a good candidate for immunotherapy, she would like a referral to immunologist to have allergy test repeated. Prednisone will also help.  -     Ambulatory referral to Immunology -     predniSONE; Take 2 tablets (40 mg total) by mouth daily with breakfast for 3 days.  Dispense: 6 tablet; Refill: 0  Acute sinusitis, recurrence not specified, unspecified location Last antibiotic treatment with Augmentin in 04/2023. We discussed adverse effects of frequent antibiotic use. I do not think antibiotic is needed at this time, she would like a prescription for Augmentin.  Recommend holding on antibiotic use until 7 to 10 days and start it if she is not feeling any better at that time.  -     Amoxicillin-Pot Clavulanate; Take 1 tablet by mouth 2 (two) times daily for 10 days.  Dispense: 20 tablet; Refill: 0  Return if symptoms worsen or fail to improve.  I, Rolla Etienne  Wierda, acting as a scribe for Chenelle Benning [place], MD., have documented all relevant documentation on the behalf of Ishmel Acevedo [place], MD, as directed by  Fong Mccarry [place], MD while in the presence of Love Chowning [place], MD.  I, Travarus Trudo [place], MD, have reviewed all documentation for this visit. The documentation on 07/11/23 for the exam, diagnosis, procedures, and orders are all accurate and complete.  Madysun Thall G. [place], MD  [facility]. [place] office.

## 2023-07-11 NOTE — Patient Instructions (Addendum)
A few things to remember from today's visit:  Mild intermittent asthma without complication - Plan: Ambulatory referral to Immunology  Allergic rhinitis due to pollen, unspecified seasonality - Plan: Ambulatory referral to Immunology  Acute sinusitis, recurrence not specified, unspecified location - Plan: amoxicillin-clavulanate (AUGMENTIN) 875-125 MG tablet  I do not think you need antibiotics at this time, start in 7-10 days oif not any better. Prednisone with breakfast for 3 days. Albuterol inh 2 puff every 6 hours for a week then as needed for wheezing or shortness of breath.  There are 2 forms of allergic rhinitis: Seasonal (hay fever): Caused by an allergy to pollen and/or mold spores in the air. Pollen is the fine powder that comes from the stamen of flowering plants. It can be carried through the air and is easily inhaled. Symptoms are seasonal and usually occur in spring, late summer, and fall. Perennial: Caused by other allergens such as dust mites, pet hair or dander, or mold. Symptoms occur year-round.  Symptoms: Your symptoms can vary, depending on the severity of your allergies. Symptoms can include: Sneezing, coughing.itching (mostly eyes, nose, mouth, throat and skin),runny nose,stuffy nose.headache,pressure in the nose and cheeks,ear fullness and popping, sore throat.watery, red, or swollen eyes,dark circles under your eyes,trouble smelling, and sometimes hives.  Allergic rhinitis cannot be prevented. You can help your symptoms by avoiding the things that you are allergic, including: Keeping windows closed. This is especially important during high-pollen seasons. Washing your hands after petting animals. Using dust- and mite-proof bedding and mattress covers. Wearing glasses outside to protect your eyes. Showering before bed to wash off allergens from hair and skin. You can also avoid things that can make your symptoms worse, such as: aerosol sprays air pollution cold  temperatures humidity irritating fumes tobacco smoke wind wood smoke.   Antihistamines help reduce the sneezing, runny nose, and itchiness of allergies. These come in pill form and as nasal sprays. Allegra,Zyrtec,or Claritin are some examples. Decongestants, such as pseudoephedrine and phenylephrine, help temporarily relieve the stuffy nose of allergies. Decongestants are found in many medicines and come as pills, nose sprays, and nose drops. They could increase heart rate and cause tachycardia and tremor. Nasal Afrin should not be used for more than 3 days because you can become dependent on them. This causes you to feel even more stopped-up when you try to quit using them.  Nasal sprays: steroids or antihistaminics. Over the counter intranasal sterids: Nasocort,Rhinocort,or Flonase.You won't notice their benefits for up to 2 weeks after starting them. Allergy shots or sublingual tablets when other treatment do not help.This is done by immunologists.  Do not use My Chart to request refills or for acute issues that need immediate attention. If you send a my chart message, it may take a few days to be addressed, specially if I am not in the office.  Please be sure medication list is accurate. If a new problem present, please set up appointment sooner than planned today.

## 2023-08-01 ENCOUNTER — Ambulatory Visit: Payer: 59 | Admitting: Family Medicine

## 2023-08-03 ENCOUNTER — Ambulatory Visit (INDEPENDENT_AMBULATORY_CARE_PROVIDER_SITE_OTHER): Payer: 59 | Admitting: Family Medicine

## 2023-08-03 VITALS — BP 98/68 | HR 77 | Temp 99.0°F | Ht 67.0 in | Wt 131.4 lb

## 2023-08-03 DIAGNOSIS — R0981 Nasal congestion: Secondary | ICD-10-CM | POA: Diagnosis not present

## 2023-08-03 DIAGNOSIS — J0140 Acute pansinusitis, unspecified: Secondary | ICD-10-CM | POA: Diagnosis not present

## 2023-08-03 MED ORDER — AMOXICILLIN-POT CLAVULANATE 500-125 MG PO TABS: 1.0000 | ORAL_TABLET | Freq: Two times a day (BID) | ORAL | 0 refills | Status: AC

## 2023-08-03 NOTE — Progress Notes (Signed)
Established Patient Office Visit   Subjective  Patient ID: [name], female    DOB: 11-12-1965  Age: 57 y.o. MRN: [id number]  Chief Complaint  Patient presents with   Cough    Cough congestion, runny nose, sinus pressure, headaches,     Patient is a 57 year old female followed by Dr. Clent Ridges and seen for ongoing concern.  Patient endorses ongoing allergy symptoms that have progressed into dry cough, nasal congestion, sore throat, postnasal drainage, facial pressure, pain and teeth x 5 days.  Denies nausea, vomiting, loose stools, fever.  Patient tried taking over-the-counter Astepro, Singulair, Mucinex-DM, and Sudafed for symptoms.  Plans to make an appointment with allergist.    Patient Active Problem List   Diagnosis Date Noted   Insomnia 07/15/2020   ADHD (attention deficit hyperactivity disorder), 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   Migraines 01/07/2020   Low back pain 01/30/2019   Environmental allergies 12/06/2017   Asthma 12/06/2017   Depression, recurrent (HCC) 07/10/2017   Anxiety and depression 07/10/2017   Bilateral ovarian cysts 06/13/2017   Herpes simplex 06/13/2017   Past Medical History:  Diagnosis Date   Anxiety    Asthma    seasonal   Headache    Pelvic pain    Past Surgical History:  Procedure Laterality Date   ABDOMINAL EXPOSURE N/A 01/30/2019   Procedure: ABDOMINAL EXPOSURE;  Surgeon: Nada Libman, MD;  Location: [facility] OR;  Service: Vascular;  Laterality: N/A;   ANTERIOR LUMBAR FUSION N/A 01/30/2019   Procedure: Anterior Lumbar Interbody Fusion L5-S1;  Surgeon: Venita Lick, MD;  Location: [facility] OR;  Service: Orthopedics;  Laterality: N/A;    CERVICAL BIOPSY  W/ LOOP ELECTRODE EXCISION     NOV 2014   CHOLECYSTECTOMY     LAPAROSCOPIC SALPINGO OOPHERECTOMY Bilateral 03/16/2017   Procedure: LAPAROSCOPIC SALPINGO OOPHORECTOMY;  Surgeon: Dara Lords, MD;  Location: [facility];  Service: Gynecology;  Laterality: Bilateral;    OPEN REDUCTION INTERNAL FIXATION (ORIF) DISTAL RADIAL FRACTURE Right 05/02/2018   Procedure: OPEN REDUCTION INTERNAL FIXATION (ORIF) DISTAL RADIAL FRACTURE;  Surgeon: Bradly Bienenstock, MD;  Location: [facility] OR;  Service: Orthopedics;  Laterality: Right;   PELVIC LAPAROSCOPY     Social History   Tobacco Use   Smoking status: Never   Smokeless tobacco: Never  Vaping Use   Vaping status: Never Used  Substance Use Topics   Alcohol use: Not Currently    Alcohol/week: 1.0 standard drink of alcohol    Types: 1 Glasses of wine per week   Drug use: No   Family History  Problem Relation Age of Onset   Hypertension Mother    Anxiety disorder Mother    Depression Mother    Breast cancer Other        GREAT AUNT   ADD / ADHD Father    Allergies  Allergen Reactions   Azithromycin Hives      ROS Negative unless stated above    Objective:     BP 98/68 (BP Location: Right Arm, Patient Position: Sitting, Cuff Size: Normal)   Pulse 77   Temp 99 F (37.2 C) (Oral)   Ht 5\' 7"  (1.702 m)   Wt 131 lb 6.4 oz (59.6 kg)   LMP 02/19/2009 (LMP Unknown)   SpO2 99%   BMI 20.58 kg/m  BP Readings from Last 3 Encounters:  08/03/23 98/68  07/11/23 110/70  06/20/23 120/87   Wt Readings from Last 3 Encounters:  08/03/23 131 lb 6.4 oz (59.6 kg)  07/11/23 133 lb 6 oz (60.5 kg)  06/20/23 133 lb (60.3 kg)      Physical Exam Constitutional:      General: She is not in acute distress.    Appearance: Normal appearance.  HENT:     Head: Normocephalic and atraumatic.     Nose: Congestion present.     Right Turbinates: Enlarged.     Left Turbinates: Enlarged.     Right Sinus: Maxillary sinus tenderness and frontal sinus tenderness present.     Left Sinus: Maxillary sinus tenderness and frontal sinus tenderness present.     Mouth/Throat:     Mouth: Mucous membranes are moist.  Eyes:     Extraocular Movements: Extraocular movements intact.     Conjunctiva/sclera: Conjunctivae normal.   Cardiovascular:     Rate and Rhythm: Normal rate and regular rhythm.     Heart sounds: Normal heart sounds. No murmur heard.    No gallop.  Pulmonary:     Effort: Pulmonary effort is normal. No respiratory distress.     Breath sounds: Normal breath sounds. No wheezing, rhonchi or rales.  Skin:    General: Skin is warm and dry.  Neurological:     Mental Status: She is alert and oriented to person, place, and time.    No results found for any visits on 08/03/23.    Assessment & Plan:  Acute pansinusitis, recurrence not specified -     Amoxicillin-Pot Clavulanate; Take 1 tablet by mouth in the morning and at bedtime for 7 days.  Dispense: 14 tablet; Refill: 0  Nasal congestion -     POC COVID-19 BinaxNow -     POCT Influenza A/B   Patient with acute continued URI symptoms.  POC COVID and flu testing negative.  Start ABX for sinusitis.  Continue OTC cough/cold medications, allergy medications.  Given strict precautions.  Return if symptoms worsen or fail to improve.   Deeann Saint, MD

## 2023-08-09 ENCOUNTER — Encounter: Payer: Self-pay | Admitting: Psychiatry

## 2023-08-15 ENCOUNTER — Encounter: Payer: 59 | Admitting: Family Medicine

## 2023-08-18 LAB — COLOGUARD

## 2023-09-06 DIAGNOSIS — Z1212 Encounter for screening for malignant neoplasm of rectum: Secondary | ICD-10-CM | POA: Diagnosis not present

## 2023-09-06 DIAGNOSIS — Z1211 Encounter for screening for malignant neoplasm of colon: Secondary | ICD-10-CM | POA: Diagnosis not present

## 2023-09-07 ENCOUNTER — Encounter: Payer: 59 | Admitting: Family Medicine

## 2023-09-14 LAB — COLOGUARD: COLOGUARD: NEGATIVE

## 2023-09-21 ENCOUNTER — Other Ambulatory Visit: Payer: Self-pay | Admitting: Family Medicine

## 2023-09-24 ENCOUNTER — Encounter: Payer: Self-pay | Admitting: Family Medicine

## 2023-09-24 ENCOUNTER — Telehealth: Payer: 59 | Admitting: Family Medicine

## 2023-09-24 DIAGNOSIS — U071 COVID-19: Secondary | ICD-10-CM

## 2023-09-24 NOTE — Progress Notes (Signed)
° °  Thank you for the details you included in the comment boxes. Those details are very helpful in determining the best course of treatment for you and help [place] to provide the best care.Because [name], we recommend that you schedule a Virtual Urgent Care video visit in order for the provider to better assess what is going on.  The provider will be able to give you a more accurate diagnosis and treatment plan if we can more freely discuss your symptoms and with the addition of a virtual examination.   If you change your visit to a video visit, we will bill your insurance (similar to an office visit) and you will not be charged for this e-Visit. You will be able to stay at home and speak with the first available [facility] advanced practice provider. The link to do a video visit is in the drop down Menu tab of your Welcome screen in MyChart.

## 2023-09-26 NOTE — Telephone Encounter (Signed)
I saw the photo she sent, and her tests were negative for Covid (she has lines for the controls but not the tests)

## 2023-09-27 ENCOUNTER — Telehealth: Payer: 59 | Admitting: Family Medicine

## 2023-09-27 DIAGNOSIS — U071 COVID-19: Secondary | ICD-10-CM

## 2023-09-27 NOTE — Progress Notes (Signed)
 Pt is requesting a note for work.  I will advise Pt she will need to schedule an in person visit.   I have spent 5 minutes in review of e-visit questionnaire, review and updating patient chart, medical decision making and response to patient.   Roosvelt Mater, PA-C

## 2023-09-28 ENCOUNTER — Other Ambulatory Visit: Payer: Self-pay | Admitting: Adult Health

## 2023-09-28 ENCOUNTER — Telehealth: Payer: 59 | Admitting: Nurse Practitioner

## 2023-09-28 DIAGNOSIS — U071 COVID-19: Secondary | ICD-10-CM | POA: Diagnosis not present

## 2023-09-28 DIAGNOSIS — J0140 Acute pansinusitis, unspecified: Secondary | ICD-10-CM | POA: Diagnosis not present

## 2023-09-28 DIAGNOSIS — G43809 Other migraine, not intractable, without status migrainosus: Secondary | ICD-10-CM

## 2023-09-28 MED ORDER — DOXYCYCLINE HYCLATE 100 MG PO TABS: 100.0000 mg | ORAL_TABLET | Freq: Two times a day (BID) | ORAL | 0 refills | Status: AC

## 2023-09-28 MED ORDER — PROMETHAZINE HCL 25 MG PO TABS: 25.0000 mg | ORAL_TABLET | Freq: Three times a day (TID) | ORAL | 0 refills | Status: DC | PRN

## 2023-09-28 NOTE — Telephone Encounter (Signed)
 Copied from CRM [id number]. Topic: Clinical - Medication Refill >> Sep 28, 2023 12:49 PM Evie B wrote: Most Recent Primary Care Visit:  Provider: MERCER CLOTILDA SAUNDERS  Department: [facility]  Visit Type: OFFICE VISIT  Date: 08/03/2023  Medication: promethazine  (PHENERGAN ) 25 MG tablet  Has the patient contacted their pharmacy? yes (Agent: If no, request that the patient contact the pharmacy for the refill. If patient does not wish to contact the pharmacy document the reason why and proceed with request.) (Agent: If yes, when and what did the pharmacy advise?)  Is this the correct pharmacy for this prescription? Yes If no, delete pharmacy and type the correct one.  This is the patient's preferred pharmacy:  [facility] [phone] - [place], [place] - [address] [place] [address] Phone: [phone] Fax: [phone]     Has the prescription been filled recently? Yes  Is the patient out of the medication? Yes  Has the patient been seen for an appointment in the last year OR does the patient have an upcoming appointment? Yes  Can we respond through MyChart? No  Agent: Please be advised that Rx refills may take up to 3 business days. We ask that you follow-up with your pharmacy.

## 2023-09-28 NOTE — Progress Notes (Signed)
 Virtual Visit Consent   [name], you are scheduled for a virtual visit with a [facility] provider today. Just as with appointments in the office, your consent must be obtained to participate. Your consent will be active for this visit and any virtual visit you may have with one of our providers in the next 365 days. If you have a MyChart account, a copy of this consent can be sent to you electronically.  As this is a virtual visit, video technology does not allow for your provider to perform a traditional examination. This may limit your provider's ability to fully assess your condition. If your provider identifies any concerns that need to be evaluated in person or the need to arrange testing (such as labs, EKG, etc.), we will make arrangements to do so. Although advances in technology are sophisticated, we cannot ensure that it will always work on either your end or our end. If the connection with a video visit is poor, the visit may have to be switched to a telephone visit. With either a video or telephone visit, we are not always able to ensure that we have a secure connection.  By engaging in this virtual visit, you consent to the provision of healthcare and authorize for your insurance to be billed (if applicable) for the services provided during this visit. Depending on your insurance coverage, you may receive a charge related to this service.  I need to obtain your verbal consent now. Are you willing to proceed with your visit today? [name] has provided verbal consent on 09/28/2023 for a virtual visit (video or telephone). Lauraine Kitty, FNP  Date: 09/28/2023 3:03 PM  Virtual Visit via Video Note   I, Lauraine Kitty, connected with  [name]  ([id number], [birth year]) on 09/28/23 at  3:15 PM EST by a video-enabled telemedicine application and verified that I am speaking with the correct person using two identifiers.  Location: Patient: Virtual Visit Location  Patient: Home Provider: Virtual Visit Location Provider: Home Office   I discussed the limitations of evaluation and management by telemedicine and the availability of in person appointments. The patient expressed understanding and agreed to proceed.    History of Present Illness: [name] is a 58 y.o. who identifies as a female who was assigned female at birth, and is being seen today for COVID follow up  She started Paxlovid  2 days ago   She is on day 6 of symptoms   Has continued with cough and nausea  She has used phenergan  in the past for relief  Is continuing to have fevers in the evening up to 104 two nights ago 102 last night  99 right now   Has been using Mucinex for relief as well   She has a history of asthma  Her cough is dry now is more productive in the mornings   She has been using her Albuterol  and her Singulair  as well    Problems:  Patient Active Problem List   Diagnosis Date Noted   Insomnia 07/15/2020   ADHD (attention deficit hyperactivity disorder), 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   Migraines 01/07/2020   Low back pain 01/30/2019   Environmental allergies 12/06/2017   Asthma 12/06/2017   Depression, recurrent (HCC) 07/10/2017   Anxiety and depression 07/10/2017   Bilateral ovarian cysts 06/13/2017   Herpes simplex 06/13/2017    Allergies:  Allergies  Allergen Reactions   Azithromycin Hives   Medications:  Current Outpatient Medications:  albuterol  (VENTOLIN  HFA) 108 (90 Base) MCG/ACT inhaler, Inhale 2 puffs into the lungs every 6 (six) hours as needed for wheezing or shortness of breath., Disp: 8 g, Rfl: 0   buPROPion  (WELLBUTRIN  XL) 150 MG 24 hr tablet, TAKE 1 TABLET BY MOUTH EVERY DAY, Disp: 90 tablet, Rfl: 1   fluticasone  (FLONASE ) 50 MCG/ACT nasal spray, SPRAY 2 SPRAYS INTO EACH NOSTRIL EVERY DAY, Disp: 48 mL, Rfl: 3   Fluticasone  Propionate, Inhal, (FLUTICASONE  PROPIONATE DISKUS) 100 MCG/ACT AEPB, Inhale 1 puff into the lungs  daily., Disp: 60 each, Rfl: 11   ibuprofen  (ADVIL ) 800 MG tablet, TAKE 1 TABLET BY MOUTH EVERY 8 HOURS AS NEEDED FOR MODERATE PAIN. **NEED NEW INSURANCE**, Disp: 90 tablet, Rfl: 1   montelukast  (SINGULAIR ) 10 MG tablet, TAKE 1 TABLET BY MOUTH EVERYDAY AT BEDTIME, Disp: 90 tablet, Rfl: 3   promethazine  (PHENERGAN ) 25 MG tablet, Take 1 tablet (25 mg total) by mouth every 8 (eight) hours as needed for nausea or vomiting., Disp: 20 tablet, Rfl: 0   rizatriptan  (MAXALT -MLT) 10 MG disintegrating tablet, TAKE 1 TABLET BY MOUTH AS NEEDED FOR MIGRAINE. *APPOINTMENT REQUIRED FOR FUTURE REFILLS*, Disp: 12 tablet, Rfl: 0   valACYclovir  (VALTREX ) 500 MG tablet, TAKE 1 TABLET BY MOUTH TWICE A DAY, Disp: 180 tablet, Rfl: 3  Observations/Objective: Patient is well-developed, well-nourished in no acute distress.  Resting comfortably  at home.  Head is normocephalic, atraumatic.  No labored breathing.  Speech is clear and coherent with logical content.  Patient is alert and oriented at baseline.    Assessment and Plan:  1. COVID-19 (Primary)  - promethazine  (PHENERGAN ) 25 MG tablet; Take 1 tablet (25 mg total) by mouth every 8 (eight) hours as needed for nausea or vomiting.  Dispense: 20 tablet; Refill: 0  2. Acute non-recurrent pansinusitis Continue OTC support with Mucinex  Push fluids  Increase protein intake and immune support   - doxycycline  (VIBRA -TABS) 100 MG tablet; Take 1 tablet (100 mg total) by mouth 2 (two) times daily for 7 days.  Dispense: 14 tablet; Refill: 0     Follow Up Instructions: I discussed the assessment and treatment plan with the patient. The patient was provided an opportunity to ask questions and all were answered. The patient agreed with the plan and demonstrated an understanding of the instructions.  A copy of instructions were sent to the patient via MyChart unless otherwise noted below.    The patient was advised to call back or seek an in-person evaluation if the  symptoms worsen or if the condition fails to improve as anticipated.    Lauraine Kitty, FNP

## 2023-09-29 NOTE — Telephone Encounter (Signed)
 Pt chart states that pt had a VV on 09/28/23

## 2023-10-02 ENCOUNTER — Telehealth: Payer: 59 | Admitting: Nurse Practitioner

## 2023-10-02 DIAGNOSIS — J40 Bronchitis, not specified as acute or chronic: Secondary | ICD-10-CM

## 2023-10-02 DIAGNOSIS — U071 COVID-19: Secondary | ICD-10-CM

## 2023-10-02 MED ORDER — PREDNISONE 20 MG PO TABS: 20.0000 mg | ORAL_TABLET | Freq: Two times a day (BID) | ORAL | 0 refills | Status: AC

## 2023-10-02 NOTE — Progress Notes (Signed)
 Virtual Visit Consent   [name], you are scheduled for a virtual visit with a [facility] provider today. Just as with appointments in the office, your consent must be obtained to participate. Your consent will be active for this visit and any virtual visit you may have with one of our providers in the next 365 days. If you have a MyChart account, a copy of this consent can be sent to you electronically.  As this is a virtual visit, video technology does not allow for your provider to perform a traditional examination. This may limit your provider's ability to fully assess your condition. If your provider identifies any concerns that need to be evaluated in person or the need to arrange testing (such as labs, EKG, etc.), we will make arrangements to do so. Although advances in technology are sophisticated, we cannot ensure that it will always work on either your end or our end. If the connection with a video visit is poor, the visit may have to be switched to a telephone visit. With either a video or telephone visit, we are not always able to ensure that we have a secure connection.  By engaging in this virtual visit, you consent to the provision of healthcare and authorize for your insurance to be billed (if applicable) for the services provided during this visit. Depending on your insurance coverage, you may receive a charge related to this service.  I need to obtain your verbal consent now. Are you willing to proceed with your visit today? [name] has provided verbal consent on 10/02/2023 for a virtual visit (video or telephone). Lauraine Kitty, FNP  Date: 10/02/2023 6:08 PM  Virtual Visit via Video Note   I, Lauraine Kitty, connected with  [name]  ([id number], 1965-10-20) on 10/02/23 at  6:15 PM EST by a video-enabled telemedicine application and verified that I am speaking with the correct person using two identifiers.  Location: Patient: Virtual Visit Location  Patient: Home Provider: Virtual Visit Location Provider: Home Office   I discussed the limitations of evaluation and management by telemedicine and the availability of in person appointments. The patient expressed understanding and agreed to proceed.    History of Present Illness: [name] is a 58 y.o. who identifies as a female who was assigned female at birth, and is being seen today for ongoing cough and fever with COVID  She woke up with a 102 fever this morning  She has mostly had fevers in the evening and early morning and they seem to dissipate during the day   She finished the Paxlovid    She has been on doxycyline for the past 5 days as well   She continues to use the promethazine  for her cough and nausea   She has a fair appetite   She has an Albuterol  inhaler though she has not been using it   Sinus symptoms have improved  Problems:  Patient Active Problem List   Diagnosis Date Noted   Insomnia 07/15/2020   ADHD (attention deficit hyperactivity disorder), 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   Migraines 01/07/2020   Low back pain 01/30/2019   Environmental allergies 12/06/2017   Asthma 12/06/2017   Depression, recurrent (HCC) 07/10/2017   Anxiety and depression 07/10/2017   Bilateral ovarian cysts 06/13/2017   Herpes simplex 06/13/2017    Allergies:  Allergies  Allergen Reactions   Azithromycin Hives   Medications:  Current Outpatient Medications:    albuterol  (VENTOLIN  HFA) 108 (90 Base) MCG/ACT  inhaler, Inhale 2 puffs into the lungs every 6 (six) hours as needed for wheezing or shortness of breath., Disp: 8 g, Rfl: 0   buPROPion  (WELLBUTRIN  XL) 150 MG 24 hr tablet, TAKE 1 TABLET BY MOUTH EVERY DAY, Disp: 90 tablet, Rfl: 1   doxycycline  (VIBRA -TABS) 100 MG tablet, Take 1 tablet (100 mg total) by mouth 2 (two) times daily for 7 days., Disp: 14 tablet, Rfl: 0   fluticasone  (FLONASE ) 50 MCG/ACT nasal spray, SPRAY 2 SPRAYS INTO EACH NOSTRIL EVERY DAY, Disp: 48  mL, Rfl: 3   Fluticasone  Propionate, Inhal, (FLUTICASONE  PROPIONATE DISKUS) 100 MCG/ACT AEPB, Inhale 1 puff into the lungs daily., Disp: 60 each, Rfl: 11   ibuprofen  (ADVIL ) 800 MG tablet, TAKE 1 TABLET BY MOUTH EVERY 8 HOURS AS NEEDED FOR MODERATE PAIN. **NEED NEW INSURANCE**, Disp: 90 tablet, Rfl: 1   montelukast  (SINGULAIR ) 10 MG tablet, TAKE 1 TABLET BY MOUTH EVERYDAY AT BEDTIME, Disp: 90 tablet, Rfl: 3   promethazine  (PHENERGAN ) 25 MG tablet, Take 1 tablet (25 mg total) by mouth every 8 (eight) hours as needed for nausea or vomiting., Disp: 20 tablet, Rfl: 0   rizatriptan  (MAXALT -MLT) 10 MG disintegrating tablet, TAKE 1 TABLET BY MOUTH AS NEEDED FOR MIGRAINE. *APPOINTMENT REQUIRED FOR FUTURE REFILLS*, Disp: 12 tablet, Rfl: 0   valACYclovir  (VALTREX ) 500 MG tablet, TAKE 1 TABLET BY MOUTH TWICE A DAY, Disp: 180 tablet, Rfl: 3  Observations/Objective: Patient is well-developed, well-nourished in no acute distress.  Resting comfortably  at home.  Head is normocephalic, atraumatic.  No labored breathing.  Speech is clear and coherent with logical content.  Patient is alert and oriented at baseline.    Assessment and Plan:  1. COVID-19 (Primary)  - predniSONE  (DELTASONE ) 20 MG tablet; Take 1 tablet (20 mg total) by mouth 2 (two) times daily with a meal for 5 days.  Dispense: 10 tablet; Refill: 0  2. Bronchitis  Finish Doxycyline  Start Albuterol  every 4-6 hours as needed   - predniSONE  (DELTASONE ) 20 MG tablet; Take 1 tablet (20 mg total) by mouth 2 (two) times daily with a meal for 5 days.  Dispense: 10 tablet; Refill: 0     Follow Up Instructions: I discussed the assessment and treatment plan with the patient. The patient was provided an opportunity to ask questions and all were answered. The patient agreed with the plan and demonstrated an understanding of the instructions.  A copy of instructions were sent to the patient via MyChart unless otherwise noted below.    The patient  was advised to call back or seek an in-person evaluation if the symptoms worsen or if the condition fails to improve as anticipated.    Lauraine Kitty, FNP

## 2023-10-03 ENCOUNTER — Ambulatory Visit: Payer: 59 | Admitting: Family Medicine

## 2023-10-06 ENCOUNTER — Telehealth: Payer: 59 | Admitting: Physician Assistant

## 2023-10-06 ENCOUNTER — Ambulatory Visit: Payer: Self-pay | Admitting: Family Medicine

## 2023-10-06 ENCOUNTER — Telehealth: Payer: 59 | Admitting: Nurse Practitioner

## 2023-10-06 DIAGNOSIS — U071 COVID-19: Secondary | ICD-10-CM

## 2023-10-06 DIAGNOSIS — J40 Bronchitis, not specified as acute or chronic: Secondary | ICD-10-CM | POA: Diagnosis not present

## 2023-10-06 MED ORDER — PROMETHAZINE-DM 6.25-15 MG/5ML PO SYRP: 5.0000 mL | ORAL_SOLUTION | Freq: Four times a day (QID) | ORAL | 0 refills | Status: DC | PRN

## 2023-10-06 NOTE — Progress Notes (Signed)
 Virtual Visit Consent   [name], you are scheduled for a virtual visit with a [facility] provider today. Just as with appointments in the office, your consent must be obtained to participate. Your consent will be active for this visit and any virtual visit you may have with one of our providers in the next 365 days. If you have a MyChart account, a copy of this consent can be sent to you electronically.  As this is a virtual visit, video technology does not allow for your provider to perform a traditional examination. This may limit your provider's ability to fully assess your condition. If your provider identifies any concerns that need to be evaluated in person or the need to arrange testing (such as labs, EKG, etc.), we will make arrangements to do so. Although advances in technology are sophisticated, we cannot ensure that it will always work on either your end or our end. If the connection with a video visit is poor, the visit may have to be switched to a telephone visit. With either a video or telephone visit, we are not always able to ensure that we have a secure connection.  By engaging in this virtual visit, you consent to the provision of healthcare and authorize for your insurance to be billed (if applicable) for the services provided during this visit. Depending on your insurance coverage, you may receive a charge related to this service.  I need to obtain your verbal consent now. Are you willing to proceed with your visit today? [name] has provided verbal consent on 10/06/2023 for a virtual visit (video or telephone). Tracy Kitty, FNP  Date: 10/06/2023 11:00 AM  Virtual Visit via Video Note   I, [name], connected with  [name]  ([id number], 01-28-1966) on 10/06/23 at 11:00 AM EST by a video-enabled telemedicine application and verified that I am speaking with the correct person using two identifiers.  Location: Patient: Virtual Visit  Location Patient: Home Provider: Virtual Visit Location Provider: Home Office   I discussed the limitations of evaluation and management by telemedicine and the availability of in person appointments. The patient expressed understanding and agreed to proceed.    History of Present Illness: [name] is a 58 y.o. who identifies as a female who was assigned female at birth, and is being seen today for ongoing cough from COVID   She submitted an E-visit prior today and was given PromethazineDM for cough support    Her cough is mostly dry in the day and may be productive at night  She continues to drink tea  Forcing fluids  Using cough drops  She has continued to have a fever over night that is not present in the day   She has been given prednisone , has finished a course of antibiotics  Has been using her Albuterol  as needed   Overall she feels better  Her biggest concern is that she is in a new job and needs ongoing release from work due to ongoing symptoms   Denies SOB   Problems:  Patient Active Problem List   Diagnosis Date Noted   Insomnia 07/15/2020   ADHD (attention deficit hyperactivity disorder), 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   Migraines 01/07/2020   Low back pain 01/30/2019   Environmental allergies 12/06/2017   Asthma 12/06/2017   Depression, recurrent (HCC) 07/10/2017   Anxiety and depression 07/10/2017   Bilateral ovarian cysts 06/13/2017   Herpes simplex 06/13/2017    Allergies:  Allergies  Allergen Reactions   Azithromycin Hives   Medications:  Current Outpatient Medications:    albuterol  (VENTOLIN  HFA) 108 (90 Base) MCG/ACT inhaler, Inhale 2 puffs into the lungs every 6 (six) hours as needed for wheezing or shortness of breath., Disp: 8 g, Rfl: 0   buPROPion  (WELLBUTRIN  XL) 150 MG 24 hr tablet, TAKE 1 TABLET BY MOUTH EVERY DAY, Disp: 90 tablet, Rfl: 1   fluticasone  (FLONASE ) 50 MCG/ACT nasal spray, SPRAY 2 SPRAYS INTO EACH NOSTRIL EVERY DAY,  Disp: 48 mL, Rfl: 3   Fluticasone  Propionate, Inhal, (FLUTICASONE  PROPIONATE DISKUS) 100 MCG/ACT AEPB, Inhale 1 puff into the lungs daily., Disp: 60 each, Rfl: 11   ibuprofen  (ADVIL ) 800 MG tablet, TAKE 1 TABLET BY MOUTH EVERY 8 HOURS AS NEEDED FOR MODERATE PAIN. **NEED NEW INSURANCE**, Disp: 90 tablet, Rfl: 1   montelukast  (SINGULAIR ) 10 MG tablet, TAKE 1 TABLET BY MOUTH EVERYDAY AT BEDTIME, Disp: 90 tablet, Rfl: 3   predniSONE  (DELTASONE ) 20 MG tablet, Take 1 tablet (20 mg total) by mouth 2 (two) times daily with a meal for 5 days., Disp: 10 tablet, Rfl: 0   promethazine  (PHENERGAN ) 25 MG tablet, Take 1 tablet (25 mg total) by mouth every 8 (eight) hours as needed for nausea or vomiting., Disp: 20 tablet, Rfl: 0   promethazine -dextromethorphan (PROMETHAZINE -DM) 6.25-15 MG/5ML syrup, Take 5 mLs by mouth 4 (four) times daily as needed for cough., Disp: 120 mL, Rfl: 0   rizatriptan  (MAXALT -MLT) 10 MG disintegrating tablet, TAKE 1 TABLET BY MOUTH AS NEEDED FOR MIGRAINE. *APPOINTMENT REQUIRED FOR FUTURE REFILLS*, Disp: 12 tablet, Rfl: 0   valACYclovir  (VALTREX ) 500 MG tablet, TAKE 1 TABLET BY MOUTH TWICE A DAY, Disp: 180 tablet, Rfl: 3  Observations/Objective: Patient is well-developed, well-nourished in no acute distress.  Resting comfortably  at home.  Head is normocephalic, atraumatic.  No labored breathing.  Speech is clear and coherent with logical content.  Patient is alert and oriented at baseline.    Assessment and Plan:  1. COVID-19 (Primary)  Due to ongoing cough with fever post antibiotics warrants in person evaluation for assessment and to rule out pneumonia   Will message PCP team, also asked patient to call their office to get an appointment   2. Bronchitis  If symptoms worsen prior to PCP appointment  she may need to go to in person UC for evaluation       Follow Up Instructions: I discussed the assessment and treatment plan with the patient. The patient was provided an  opportunity to ask questions and all were answered. The patient agreed with the plan and demonstrated an understanding of the instructions.  A copy of instructions were sent to the patient via MyChart unless otherwise noted below.    The patient was advised to call back or seek an in-person evaluation if the symptoms worsen or if the condition fails to improve as anticipated.    Tracy Kitty, FNP

## 2023-10-06 NOTE — Telephone Encounter (Signed)
    Chief Complaint: cough Symptoms: severe cough, fever, mild sob Frequency: since the end of december Pertinent Negatives: Patient denies chest pain Disposition: [] ED /[] Urgent Care (no appt availability in office) / [] Appointment(In office/virtual)/ [x]  [facility]/ [] Home Care/ [] Refused Recommended Disposition /[] [facility]/ []  Follow-up with PCP Additional Notes: Patient reports she's been experiencing severe cough since her covid diagnosis at the end of December. Patient reports she was prescribed prednisone  and paxlovid  at the time but she has not had improvement in symptoms. Patient reports fever of 101F, severe cough, and mild SOB. Patient requesting prescription cough medication to help her sleep so she is able to go back to work. Per protocol, virtual appt scheduled today 1/10. Patient advised to call back with worsening symptoms.    Copied from CRM [id number]. Topic: Clinical - Red Word Triage >> Oct 06, 2023  9:19 AM Joanell NOVAK wrote: Red Word that prompted transfer to Nurse Triage: Pt stated that she is having a post covid cough and fever at 101. Reason for Disposition  SEVERE coughing spells (e.g., whooping sound after coughing, vomiting after coughing)  Answer Assessment - Initial Assessment Questions 1. ONSET: When did the cough begin?      Tested positive for covid December 28th  2. SEVERITY: How bad is the cough today?      severe 3. SPUTUM: Describe the color of your sputum (none, dry cough; clear, white, yellow, green)     Dry cough 4. HEMOPTYSIS: Are you coughing up any blood? If so ask: How much? (flecks, streaks, tablespoons, etc.)     none 5. DIFFICULTY BREATHING: Are you having difficulty breathing? If Yes, ask: How bad is it? (e.g., mild, moderate, severe)    - MILD: No SOB at rest, mild SOB with walking, speaks normally in sentences, can lie down, no retractions, pulse < 100.    - MODERATE: SOB at rest, SOB with minimal  exertion and prefers to sit, cannot lie down flat, speaks in phrases, mild retractions, audible wheezing, pulse 100-120.    - SEVERE: Very SOB at rest, speaks in single words, struggling to breathe, sitting hunched forward, retractions, pulse > 120      mild 6. FEVER: Do you have a fever? If Yes, ask: What is your temperature, how was it measured, and when did it start?     101F 7. CARDIAC HISTORY: Do you have any history of heart disease? (e.g., heart attack, congestive heart failure)      none 8. LUNG HISTORY: Do you have any history of lung disease?  (e.g., pulmonary embolus, asthma, emphysema)     asthma 9. PE RISK FACTORS: Do you have a history of blood clots? (or: recent major surgery, recent prolonged travel, bedridden)     none 10. OTHER SYMPTOMS: Do you have any other symptoms? (e.g., runny nose, wheezing, chest pain)       Fever, cough  Protocols used: Cough - Acute Productive-A-AH

## 2023-10-06 NOTE — Progress Notes (Signed)
 E-Visit for Cough  Thank you for the additional information. I have sent a work note to you through [employer].   We are sorry that you are not feeling well.  Here is how we plan to help!  Based on your presentation I believe you most likely have a cough due to COVID virus   I have prescribed phenergan  DM to help with your cough.    From your responses in the eVisit questionnaire you describe inflammation in the upper respiratory tract which is causing a significant cough.  This is commonly called Bronchitis and has four common causes:   Allergies Viral Infections Acid Reflux Bacterial Infection Allergies, viruses and acid reflux are treated by controlling symptoms or eliminating the cause. An example might be a cough caused by taking certain blood pressure medications. You stop the cough by changing the medication. Another example might be a cough caused by acid reflux. Controlling the reflux helps control the cough.  USE OF BRONCHODILATOR (RESCUE) INHALERS: There is a risk from using your bronchodilator too frequently.  The risk is that over-reliance on a medication which only relaxes the muscles surrounding the breathing tubes can reduce the effectiveness of medications prescribed to reduce swelling and congestion of the tubes themselves.  Although you feel brief relief from the bronchodilator inhaler, your asthma may actually be worsening with the tubes becoming more swollen and filled with mucus.  This can delay other crucial treatments, such as oral steroid medications. If you need to use a bronchodilator inhaler daily, several times per day, you should discuss this with your provider.  There are probably better treatments that could be used to keep your asthma under control.     HOME CARE Only take medications as instructed by your medical team. Complete the entire course of an antibiotic. Drink plenty of fluids and get plenty of rest. Avoid close contacts especially the very young and  the elderly Cover your mouth if you cough or cough into your sleeve. Always remember to wash your hands A steam or ultrasonic humidifier can help congestion.   GET HELP RIGHT AWAY IF: You develop worsening fever. You become short of breath You cough up blood. Your symptoms persist after you have completed your treatment plan MAKE SURE YOU  Understand these instructions. Will watch your condition. Will get help right away if you are not doing well or get worse.    Thank you for choosing an e-visit.  Your e-visit answers were reviewed by a board certified advanced clinical practitioner to complete your personal care plan. Depending upon the condition, your plan could have included both over the counter or prescription medications.  Please review your pharmacy choice. Make sure the pharmacy is open so you can pick up prescription now. If there is a problem, you may contact your provider through [employer] and have the prescription routed to another pharmacy.  Your safety is important to us . If you have drug allergies check your prescription carefully.   For the next 24 hours you can use MyChart to ask questions about today's visit, request a non-urgent call back, or ask for a work or school excuse. You will get an email in the next two days asking about your experience. I hope that your e-visit has been valuable and will speed your recovery.  I have spent 5 minutes in review of e-visit questionnaire, review and updating patient chart, medical decision making and response to patient.   Jon Belt, PhD, FNP-BC

## 2023-10-09 ENCOUNTER — Ambulatory Visit: Payer: 59 | Admitting: Internal Medicine

## 2023-10-10 NOTE — Telephone Encounter (Signed)
 Pt already seen on 10/06/23 by Dr Jimmey Ralph

## 2023-10-12 ENCOUNTER — Encounter: Payer: 59 | Admitting: Family Medicine

## 2023-10-13 ENCOUNTER — Other Ambulatory Visit: Payer: Self-pay | Admitting: Family Medicine

## 2023-10-16 ENCOUNTER — Encounter: Payer: 59 | Admitting: Family Medicine

## 2023-10-16 ENCOUNTER — Ambulatory Visit: Payer: 59 | Admitting: Family Medicine

## 2023-10-17 ENCOUNTER — Encounter: Payer: Self-pay | Admitting: Family Medicine

## 2023-10-17 ENCOUNTER — Ambulatory Visit: Payer: BC Managed Care – PPO

## 2023-10-17 ENCOUNTER — Ambulatory Visit (INDEPENDENT_AMBULATORY_CARE_PROVIDER_SITE_OTHER): Payer: BC Managed Care – PPO | Admitting: Family Medicine

## 2023-10-17 VITALS — BP 100/68 | HR 91 | Temp 98.2°F | Wt 133.4 lb

## 2023-10-17 DIAGNOSIS — J189 Pneumonia, unspecified organism: Secondary | ICD-10-CM | POA: Diagnosis not present

## 2023-10-17 DIAGNOSIS — U071 COVID-19: Secondary | ICD-10-CM | POA: Diagnosis not present

## 2023-10-17 MED ORDER — AMOXICILLIN-POT CLAVULANATE 875-125 MG PO TABS: 1.0000 | ORAL_TABLET | Freq: Two times a day (BID) | ORAL | 0 refills | Status: DC

## 2023-10-17 NOTE — Progress Notes (Signed)
   Subjective:    Patient ID: [name], female    DOB: 1965/12/29, 58 y.o.   MRN: [id number]  HPI Here to follow up on a Covid infection that still has lingering symptoms. She tested positive for Covid on 09-24-23 at home after she had several days of fevers up to 104 degrees and a deep cough. She had 6 contacts with [place], either through E visits or virtual visits, and she has been treated with 5 days of Paxlovid, 7 days of Augmentin, and Prednisone. She says she began to feel better while taking the Augmentin, but since finishing this a week ago she has begun feeling worse again. She still has night time fevers (her temp this morning was 100 degrees) and a dry cough. She has mild SOB at times, and she uses her inhaler daily. Her O2 sat has never dropped below 95%. Today she feels very weak and fatigued. She is drinking fluids and taking Tylenol.    Review of Systems  Constitutional:  Positive for fever.  HENT: Negative.    Eyes: Negative.   Respiratory:  Positive for cough and shortness of breath.   Cardiovascular: Negative.   Gastrointestinal: Negative.        Objective:   Physical Exam Constitutional:      Appearance: Normal appearance.  HENT:     Right Ear: Tympanic membrane, ear canal and external ear normal.     Left Ear: Tympanic membrane, ear canal and external ear normal.     Nose: Nose normal.     Mouth/Throat:     Pharynx: Oropharynx is clear.  Eyes:     Conjunctiva/sclera: Conjunctivae normal.  Cardiovascular:     Rate and Rhythm: Normal rate and regular rhythm.     Pulses: Normal pulses.     Heart sounds: Normal heart sounds.  Pulmonary:     Effort: Pulmonary effort is normal.     Breath sounds: Rales present. No wheezing or rhonchi.     Comments: There is consolidation and faint rales in the right lower lung  Abdominal:     General: Bowel sounds are normal.  Lymphadenopathy:     Cervical: No cervical adenopathy.  Neurological:     Mental Status: She  is alert.           Assessment & Plan:  She has a RLL pneumonia as the result of a Covid infection. We will treat with 10 days of Augmentin. Get a CXR. We will see her back early next week. We spent a total of (34   ) minutes reviewing records and discussing these issues.  Gershon Crane, MD

## 2023-10-19 ENCOUNTER — Encounter: Payer: Self-pay | Admitting: Family Medicine

## 2023-10-19 ENCOUNTER — Other Ambulatory Visit: Payer: Self-pay | Admitting: Nurse Practitioner

## 2023-10-19 ENCOUNTER — Other Ambulatory Visit: Payer: Self-pay | Admitting: Family Medicine

## 2023-10-19 DIAGNOSIS — U071 COVID-19: Secondary | ICD-10-CM

## 2023-10-19 MED ORDER — PROMETHAZINE HCL 25 MG PO TABS: 25.0000 mg | ORAL_TABLET | Freq: Four times a day (QID) | ORAL | 2 refills | Status: DC | PRN

## 2023-10-19 NOTE — Telephone Encounter (Signed)
Copied from CRM [id number]. Topic: General - Other >> Oct 19, 2023 10:16 AM Sonny Dandy B wrote: Reason for CRM: pt called to follow up on DR.note from her visit on  10/17/23 pt is request it be sent via my chart please call pt back at [phone]

## 2023-10-20 ENCOUNTER — Telehealth: Payer: Self-pay | Admitting: Family Medicine

## 2023-10-20 ENCOUNTER — Telehealth: Payer: BC Managed Care – PPO

## 2023-10-20 MED ORDER — METHYLPREDNISOLONE 4 MG PO TBPK: ORAL_TABLET | ORAL | 0 refills | Status: DC

## 2023-10-20 NOTE — Telephone Encounter (Signed)
We can certainly try this. I will send in a Medrol dose pack

## 2023-10-20 NOTE — Telephone Encounter (Signed)
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

## 2023-10-20 NOTE — Telephone Encounter (Unsigned)
Copied from CRM [id number]. Topic: General - Other >> Oct 20, 2023  4:20 PM Clayton Bibles wrote: Reason for CRM: Neysa Bonito called and was looking for the doctor letter for her work. I found in chart and read it to her. 10/17/2023  [name] The above named patient had a medical visit today at [facility] at  4 PM Please take this into consideration when reviewing the time away from work. Feel Free to contact our office with any questions. Sincerely yours, Gershon Crane MD [facility] needs the letter to state she needs to be off work due to sickness for 10/17/23 through 10/20/23. Thanks

## 2023-10-20 NOTE — Telephone Encounter (Signed)
Copied from CRM [id number]. Topic: General - Other >> Oct 20, 2023  8:31 AM Donita Brooks wrote: Reason for CRM: pt was seen on Tuesday by Dr. Clent Ridges. Pt been reaching out to Dr. Clent Ridges regarding a note for her employer. Pt needs note for work

## 2023-10-23 ENCOUNTER — Telehealth: Payer: Self-pay | Admitting: Family Medicine

## 2023-10-23 NOTE — Telephone Encounter (Signed)
Copied from CRM [id number]. Topic: General - Other >> Oct 23, 2023 12:34 PM Corin V wrote: Reason for CRM: Patient's sick note for 10/17/23-10/23/23 is not showing in her MyChart account anywhere she looks. Please fax letter to [phone]

## 2023-10-24 NOTE — Telephone Encounter (Signed)
Letter was put in blue box to be mail out on 1-28 also fax per nancy

## 2023-10-24 NOTE — Telephone Encounter (Signed)
The work note has been fax and put in blue box to be mail to patient per nancy

## 2023-10-26 NOTE — Telephone Encounter (Signed)
Noted

## 2023-10-26 NOTE — Telephone Encounter (Signed)
Done

## 2023-10-26 NOTE — Telephone Encounter (Signed)
This was faxed to pt work per pt request

## 2023-11-09 ENCOUNTER — Encounter: Payer: Self-pay | Admitting: Family Medicine

## 2023-11-09 DIAGNOSIS — F900 Attention-deficit hyperactivity disorder, predominantly inattentive type: Secondary | ICD-10-CM

## 2023-11-09 DIAGNOSIS — F32A Depression, unspecified: Secondary | ICD-10-CM

## 2023-11-20 NOTE — Telephone Encounter (Signed)
 I did the referral to [facility] outpatient behavioral center in [place]

## 2023-11-21 ENCOUNTER — Encounter: Payer: Self-pay | Admitting: Family Medicine

## 2023-11-21 ENCOUNTER — Telehealth: Payer: BC Managed Care – PPO | Admitting: Family Medicine

## 2023-11-21 ENCOUNTER — Ambulatory Visit: Payer: Self-pay | Admitting: Family Medicine

## 2023-11-21 ENCOUNTER — Other Ambulatory Visit: Payer: Self-pay | Admitting: Family Medicine

## 2023-11-21 ENCOUNTER — Encounter: Payer: BC Managed Care – PPO | Admitting: Family Medicine

## 2023-11-21 VITALS — Ht 67.0 in

## 2023-11-21 DIAGNOSIS — F419 Anxiety disorder, unspecified: Secondary | ICD-10-CM

## 2023-11-21 DIAGNOSIS — F339 Major depressive disorder, recurrent, unspecified: Secondary | ICD-10-CM | POA: Diagnosis not present

## 2023-11-21 MED ORDER — DIAZEPAM 5 MG PO TABS: 5.0000 mg | ORAL_TABLET | Freq: Every day | ORAL | 0 refills | Status: DC | PRN

## 2023-11-21 NOTE — Assessment & Plan Note (Signed)
 She is not interested in starting any SSRI or SNRI at this time, reports poor tolerance in the past. She has an appointment with behavioral health and with PCP already scheduled. Instructed about warning signs.

## 2023-11-21 NOTE — Telephone Encounter (Signed)
 Copied from CRM [id number]. Topic: Clinical - Red Word Triage >> Nov 21, 2023 11:42 AM Martinique E wrote: Patient has been experiencing some heightened anxiety. Start crying on the phone as her work/personal life has been keeping her up at night and she cannot get a good night's sleep. Patient wanted to speak with a nurse about these symptoms.   Chief Complaint: severe anxiety Symptoms: panic attacks, insomnia, poor eating, feels overwhelmed and tearful  Frequency: ongoing with care giving, may lose job  Pertinent Negatives: Patient denies suicidal ideation Disposition: [] ED /[] Urgent Care (no appt availability in office) / [x] Appointment(In office/virtual)/ []  [facility]/ [] Home Care/ [] Refused Recommended Disposition /[] [facility]/ []  Follow-up with PCP Additional Notes: needed appt and no Opening until Friday - pt asking for one ASAP Has upcoming appt with a therapist and [facility] set up appt with physician- please 11/28/23 appt   Reason for Disposition  Patient sounds very upset or troubled to the triager  Answer Assessment - Initial Assessment Questions 1. 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 ANXIETY SYMPTOMS: "Can you describe how you (your loved one; patient) have been feeling?" (e.g., tense, restless, panicky, anxious, keyed up, overwhelmed, sense of impending doom).      Anxiety, work pressures, overwhelmed  3. ONSET: "How long have you been feeling this way?" (e.g., hours, days, weeks)     Ongoing with her mom-  4. SEVERITY: "How would you rate the level of anxiety?" (e.g., 0 - 10; or mild, moderate, severe).     severe 5. FUNCTIONAL IMPAIRMENT: "How have these feelings affected your ability to do daily activities?" "Have you had more difficulty than usual doing your normal daily activities?" (e.g., getting better, same, worse; self-care, school, work,  interactions)     *No Answer* 6. HISTORY: "Have you felt this way before?" "Have you ever been diagnosed with an anxiety problem in the past?" (e.g., generalized anxiety disorder, panic attacks, PTSD). If Yes, ask: "How was this problem treated?" (e.g., medicines, counseling, etc.)     Panic attack 7. RISK OF HARM - SUICIDAL IDEATION: "Do you ever have thoughts of hurting or killing yourself?" If Yes, ask:  "Do you have these feelings now?" "Do you have a plan on how you would do this?"     no 8. TREATMENT:  "What has been done so far to treat this anxiety?" (e.g., medicines, relaxation strategies). "What has helped?"     *No Answer* 9. TREATMENT - THERAPIST: "Do you have a counselor or therapist? Name?"     Has an upcoming appt 10. POTENTIAL TRIGGERS: "Do you drink caffeinated beverages (e.g., coffee, colas, teas), and how much daily?" "Do you drink alcohol or use any drugs?" "Have you started any new medicines recently?"       May lose job, caregiver to her Mother, grief  23. PATIENT SUPPORT: "Who is with you now?" "Who do you live with?" "Do you have family or friends who you can talk to?"        Out of town alone she said was good.  12. OTHER SYMPTOMS: "Do you have any other symptoms?" (e.g., feeling depressed, trouble concentrating, trouble sleeping, trouble breathing, palpitations or fast heartbeat, chest pain, sweating, nausea, or diarrhea)       Panic, insomnia, poor appetite  Protocols used: Anxiety and Panic Attack-A-AH

## 2023-11-21 NOTE — Progress Notes (Signed)
 Virtual Visit via Video Note I connected with [name] on 11/21/23 by a video enabled telemedicine application and verified that I am speaking with the correct person using two identifiers. Location patient: home Location provider + scribe: work office Persons participating in the virtual visit: patient, provider, and scribe.   I discussed the limitations of evaluation and management by telemedicine and the availability of in person appointments. The patient expressed understanding and agreed to proceed.   Chief Complaint  Patient presents with   Anxiety  HPI: Ms Diegel is a 58 year old female with past medical history significant for depression, anxiety, ADHD, asthma, and migraines who is being seen today for worsening anxiety.  Pt reports acute situational stress due to work, grieving her father who recently passed, and taking care of her mother. Her stress and anxiety has increased and worsened in the last few days.  Additionally, she moved back to [facility] in the last year and has struggled to get established with a psychotherapist.  In the past she has tried various SSRIs (Prozac and Zoloft) and reports weight gain and insomnia as side effects.  She has also tried Wellbutrin, as well as Alprazolam and Diazepam.  While on Buspar and Hydroxyzine she felt "jittery".   Pt tends to sleep about 6-7 hours a night, but did not sleep at all last night.  She hopes to get established with a new psychologist and psychiatrist soon who will be able to take over medication management. Negative for fever, chills, CP, palpitation, dyspnea, abdominal pain, vomiting, changes in bowel habits, urinary symptoms, focal weakness.  Pt denies SI/HI.  ROS: See pertinent positives and negatives per HPI.  Past Medical History:  Diagnosis Date   Anxiety    Asthma    seasonal   Headache    Pelvic pain    Past Surgical History:  Procedure Laterality Date   ABDOMINAL EXPOSURE N/A  01/30/2019   Procedure: ABDOMINAL EXPOSURE;  Surgeon: Nada Libman, MD;  Location: [facility] OR;  Service: Vascular;  Laterality: N/A;   ANTERIOR LUMBAR FUSION N/A 01/30/2019   Procedure: Anterior Lumbar Interbody Fusion L5-S1;  Surgeon: Venita Lick, MD;  Location: [facility] OR;  Service: Orthopedics;  Laterality: N/A;    CERVICAL BIOPSY  W/ LOOP ELECTRODE EXCISION     NOV 2014   CHOLECYSTECTOMY     LAPAROSCOPIC SALPINGO OOPHERECTOMY Bilateral 03/16/2017   Procedure: LAPAROSCOPIC SALPINGO OOPHORECTOMY;  Surgeon: Dara Lords, MD;  Location: [facility];  Service: Gynecology;  Laterality: Bilateral;   OPEN REDUCTION INTERNAL FIXATION (ORIF) DISTAL RADIAL FRACTURE Right 05/02/2018   Procedure: OPEN REDUCTION INTERNAL FIXATION (ORIF) DISTAL RADIAL FRACTURE;  Surgeon: Bradly Bienenstock, MD;  Location: [facility] OR;  Service: Orthopedics;  Laterality: Right;   PELVIC LAPAROSCOPY     Family History  Problem Relation Age of Onset   Hypertension Mother    Anxiety disorder Mother    Depression Mother    Breast cancer Other        GREAT AUNT   ADD / ADHD Father     Social History   Socioeconomic History   Marital status: Divorced    Spouse name: Not on file   Number of children: Not on file   Years of education: Not on file   Highest education level: Associate degree: occupational, Scientist, product/process development, or vocational program  Occupational History   Not on file  Tobacco Use   Smoking status: Never   Smokeless tobacco: Never  Vaping Use   Vaping  status: Never Used  Substance and Sexual Activity   Alcohol use: Not Currently    Alcohol/week: 1.0 standard drink of alcohol    Types: 1 Glasses of wine per week   Drug use: No   Sexual activity: Not Currently    Birth control/protection: Post-menopausal  Other Topics Concern   Not on file  Social History Narrative   Not on file   Social Drivers of Health   Financial Resource Strain: Medium Risk (11/21/2023)   Overall Financial Resource  Strain (CARDIA)    Difficulty of Paying Living Expenses: Somewhat hard  Food Insecurity: Food Insecurity Present (11/21/2023)   Hunger Vital Sign    Worried About Running Out of Food in the Last Year: Sometimes true    Ran Out of Food in the Last Year: Sometimes true  Transportation Needs: No Transportation Needs (11/21/2023)   PRAPARE - Administrator, Civil Service (Medical): No    Lack of Transportation (Non-Medical): No  Physical Activity: Insufficiently Active (11/21/2023)   Exercise Vital Sign    Days of Exercise per Week: 4 days    Minutes of Exercise per Session: 30 min  Stress: Stress Concern Present (11/21/2023)   [employer] of Occupational Health - Occupational Stress Questionnaire    Feeling of Stress : Very much  Social Connections: Moderately Integrated (11/21/2023)   Social Connection and Isolation Panel [NHANES]    Frequency of Communication with Friends and Family: More than three times a week    Frequency of Social Gatherings with Friends and Family: More than three times a week    Attends Religious Services: More than 4 times per year    Active Member of [employer] or Organizations: Yes    Attends Banker Meetings: 1 to 4 times per year    Marital Status: Divorced  Intimate Partner Violence: Unknown (12/29/2021)   Received from [employer], [facility]   HITS    Physically Hurt: Not on file    Insult or Talk Down To: Not on file    Threaten Physical Harm: Not on file    Scream or Curse: Not on file    Current Outpatient Medications:    albuterol (VENTOLIN HFA) 108 (90 Base) MCG/ACT inhaler, Inhale 2 puffs into the lungs every 6 (six) hours as needed for wheezing or shortness of breath., Disp: 8 g, Rfl: 0   amoxicillin-clavulanate (AUGMENTIN) 875-125 MG tablet, Take 1 tablet by mouth 2 (two) times daily., Disp: 20 tablet, Rfl: 0   diazepam (VALIUM) 5 MG tablet, Take 1 tablet (5 mg total) by mouth daily as needed for up to 7 days for  anxiety., Disp: 7 tablet, Rfl: 0   fluticasone (FLONASE) 50 MCG/ACT nasal spray, SPRAY 2 SPRAYS INTO EACH NOSTRIL EVERY DAY, Disp: 48 mL, Rfl: 3   Fluticasone Propionate, Inhal, (FLUTICASONE PROPIONATE DISKUS) 100 MCG/ACT AEPB, Inhale 1 puff into the lungs daily., Disp: 60 each, Rfl: 11   ibuprofen (ADVIL) 800 MG tablet, TAKE 1 TABLET BY MOUTH EVERY 8 HOURS AS NEEDED FOR MODERATE PAIN. **NEED NEW INSURANCE**, Disp: 90 tablet, Rfl: 1   methylPREDNISolone (MEDROL DOSEPAK) 4 MG TBPK tablet, As directed, Disp: 21 tablet, Rfl: 0   montelukast (SINGULAIR) 10 MG tablet, TAKE 1 TABLET BY MOUTH EVERYDAY AT BEDTIME, Disp: 90 tablet, Rfl: 3   promethazine (PHENERGAN) 25 MG tablet, Take 1 tablet (25 mg total) by mouth every 6 (six) hours as needed for nausea or vomiting., Disp: 60 tablet, Rfl: 2  promethazine-dextromethorphan (PROMETHAZINE-DM) 6.25-15 MG/5ML syrup, Take 5 mLs by mouth 4 (four) times daily as needed for cough., Disp: 120 mL, Rfl: 0   rizatriptan (MAXALT-MLT) 10 MG disintegrating tablet, TAKE 1 TABLET BY MOUTH AS NEEDED FOR MIGRAINE. *APPOINTMENT REQUIRED FOR FUTURE REFILLS*, Disp: 12 tablet, Rfl: 0   valACYclovir (VALTREX) 500 MG tablet, TAKE 1 TABLET BY MOUTH TWICE A DAY, Disp: 180 tablet, Rfl: 3  EXAM:  VITALS Ht 5\' 7"  (1.702 m)   LMP 02/19/2009 (LMP Unknown)   BMI 20.89 kg/m   GENERAL: alert, oriented, appears well and in no acute distress  HEENT: atraumatic, conjunctiva clear, no obvious abnormalities on inspection of external nose and ears  NECK: normal movements of the head and neck  LUNGS: on inspection no signs of respiratory distress, breathing rate appears normal, no obvious gross SOB, gasping or wheezing  CV: no obvious cyanosis  MS: moves all visible extremities without noticeable abnormality  PSYCH/NEURO: pleasant and cooperative, no obvious depression,speech and thought processing grossly intact. Very anxious, no suicidal.  ASSESSMENT AND PLAN: Discussed the  following assessment and plan:  Depression, recurrent (HCC)  Anxiety disorder, unspecified type - Plan: diazepam (VALIUM) 5 MG tablet  Depression, recurrent (HCC) Assessment & Plan: She is not interested in starting any SSRI or SNRI at this time, she tried several in the past and reports poor tolerance. She has an appointment with behavioral health on 12/05/23 and with PCP already scheduled for 11/28/23. Instructed about warning signs.  Anxiety disorder, unspecified type We discussed treatment options.  In the past she has tried Prozac, Zoloft, BuSpar, and hydroxyzine. Medications she has tried in the past has been poor tolerated with some side effects, she would like to have a short-term prescription for diazepam, which has helped in the past. Diazepam 5 mg daily as needed sent to her pharmacy as requested. PDMP reviewed. We discussed some side effects. She has an appointment with PCP next week and appointment at the behavioral health clinic already scheduled.  -     diazePAM; Take 1 tablet (5 mg total) by mouth daily as needed for up to 7 days for anxiety.  Dispense: 7 tablet; Refill: 0  We discussed possible serious and likely etiologies, options for evaluation and workup, limitations of telemedicine visit vs in person visit, treatment, treatment risks and precautions. The patient was advised to call back or seek an in-person evaluation if the symptoms worsen or if the condition fails to improve as anticipated. I discussed the assessment and treatment plan with the patient. The patient was provided an opportunity to ask questions and all were answered. The patient agreed with the plan and demonstrated an understanding of the instructions.  Return for keep next appointment.  I, Isabelle Course, acting as a scribe for Havah Ammon [place], MD., have documented all relevant documentation on the behalf of Kennley Schwandt [place], MD, as directed by  Imogine Carvell [place], MD while in the presence of Verle Wheeling [place],  MD.  I, Marlee Armenteros [place], MD, have reviewed all documentation for this visit. The documentation on 11/21/23 for the exam, diagnosis, procedures, and orders are all accurate and complete.  Savaughn Karwowski G. [place], MD  [facility]. [place] office

## 2023-11-21 NOTE — Telephone Encounter (Signed)
 Copied from CRM [id number]. Topic: Clinical - Medication Refill >> Nov 21, 2023  5:14 PM Alcus Dad wrote: Most Recent Primary Care Visit:  Provider: [place], BETTY G  Department: [facility]  Visit Type: ACUTE  Date: 11/21/2023  Medication: diazepam (VALIUM) 5 MG tablet  Has the patient contacted their pharmacy? Yes (Agent: If no, request that the patient contact the pharmacy for the refill. If patient does not wish to contact the pharmacy document the reason why and proceed with request.) (Agent: If yes, when and what did the pharmacy advise?)  Is this the correct pharmacy for this prescription? Yes If no, delete pharmacy and type the correct one.  This is the patient's preferred pharmacy:  [phone] [place], [place] - [address]  [place] [address] Phone: [phone] Fax: [phone]  [id number] [place] [place] [facility] [address] [place] [address] Phone: [phone] Fax: [phone]   Has the prescription been filled recently? No  Is the patient out of the medication? Yes  Has the patient been seen for an appointment in the last year OR does the patient have an upcoming appointment? Yes  Can we respond through MyChart? Yes  Agent: Please be advised that Rx refills may take up to 3 business days. We ask that you follow-up with your pharmacy.

## 2023-11-22 ENCOUNTER — Encounter: Payer: Self-pay | Admitting: Family Medicine

## 2023-11-22 ENCOUNTER — Telehealth: Payer: Self-pay

## 2023-11-22 ENCOUNTER — Ambulatory Visit: Payer: Self-pay | Admitting: Family Medicine

## 2023-11-22 DIAGNOSIS — F419 Anxiety disorder, unspecified: Secondary | ICD-10-CM

## 2023-11-22 MED ORDER — DIAZEPAM 5 MG PO TABS: 5.0000 mg | ORAL_TABLET | Freq: Three times a day (TID) | ORAL | 0 refills | Status: DC | PRN

## 2023-11-22 NOTE — Telephone Encounter (Signed)
 Done

## 2023-11-22 NOTE — Telephone Encounter (Signed)
 Pt message sent to  PCP for advise ?

## 2023-11-22 NOTE — Telephone Encounter (Signed)
 [facility] [place], [place] can not fill new prescription but can fill a refill prescription, if valium Rx can be sent to [facility], [place]. Patient reports she is feeling very anxious and heart racing, no chest pain no difficulty breathing. Patient embarrassed she has become anxious. Not slept in 2 nights while out of town for work. Patient requesting call back when medication refill sent to pharmacy. Recommended to patient if sx worsen go to ED. Please advise.

## 2023-11-22 NOTE — Telephone Encounter (Signed)
 Copied from CRM [id number]. Topic: Clinical - Red Word Triage >> Nov 22, 2023  9:56 AM Fredrich Romans wrote: Red Word that prompted transfer to Nurse Triage: anxiety This encounter was created in error - please disregard.

## 2023-11-22 NOTE — Addendum Note (Signed)
 Addended by: Gershon Crane A on: 11/22/2023 05:23 PM   Modules accepted: Orders

## 2023-11-22 NOTE — Telephone Encounter (Signed)
 Pt LOV was on 10/17/23 Last refill was done by Dr Swaziland for 7 tablets on 11/21/23 Pt pharmacy states that they will not fill this Rx since Dr Swaziland is not the PCP.  Pleas sent refill for pt at the pharmacy listed

## 2023-11-22 NOTE — Telephone Encounter (Signed)
 Pt has appointment with Dr Clent Ridges on 11/28/23 for this issues

## 2023-11-22 NOTE — Telephone Encounter (Addendum)
 Chief Complaint: panic attack Symptoms: fast heartbeat, anxiety Frequency: yesterday Pertinent Negatives: Patient denies MI, SOB Disposition: [x] ED /[] Urgent Care (no appt availability in office) / [] Appointment(In office/virtual)/ []  [facility]/ [] Home Care/ [] Refused Recommended Disposition /[] [facility]/ [x]  Follow-up with PCP Additional Notes: Patient c/o panic attack r/t business trip in [place]. Pt reports that saw Dr Swaziland yesterday and was prescribed Diazepam yesterday but unable to fill since it is a new Rx and she is currently in [place]. Pt requesting that Dr. Clent Ridges send in a refill on original Diazepam prescription so that it could be filled in [place] per pharmacy recs. Triager reinforced the complications of controlled rx's in different states and advised ED as an immediate solution based on sx. Pt stated "I gotta go" and disconnected call.    Copied from CRM [id number]. Topic: Clinical - Red Word Triage >> Nov 22, 2023  9:25 AM Theodis Sato wrote: Red Word that prompted transfer to Nurse Triage: Multiple panic attacks, hasn't slept for 2 days and heart at 110 - doesn't have access to her medication as she is out of town for work in [place]. Reason for Disposition  [1] SEVERE anxiety (e.g., extremely anxious with intense emotional symptoms such as feeling of unreality, urge to flee, unable to calm down; unable to cope or function) AND [2] not better after 10 minutes of reassurance and Care Advice  Answer Assessment - Initial Assessment Questions 1. CONCERN: "Did anything happen that prompted you to call today?"      "I just need a friendly voice to listen to me" "I am in [place] for a business conference and my anxiety is out of control" 2. ANXIETY SYMPTOMS: "Can you describe how you (your loved one; patient) have been feeling?" (e.g., tense, restless, panicky, anxious, keyed up, overwhelmed, sense of impending doom).      stressed 3. ONSET: "How long have you  been feeling this way?" (e.g., hours, days, weeks)     Since being in [place] for trip 4. SEVERITY: "How would you rate the level of anxiety?" (e.g., 0 - 10; or mild, moderate, severe).     12/10 5. FUNCTIONAL IMPAIRMENT: "How have these feelings affected your ability to do daily activities?" "Have you had more difficulty than usual doing your normal daily activities?" (e.g., getting better, same, worse; self-care, school, work, interactions)     Worse since at a business trip  6. HISTORY: "Have you felt this way before?" "Have you ever been diagnosed with an anxiety problem in the past?" (e.g., generalized anxiety disorder, panic attacks, PTSD). If Yes, ask: "How was this problem treated?" (e.g., medicines, counseling, etc.)     Yes, does self-care and Reports taking 0.25mg  PRN, 7. RISK OF HARM - SUICIDAL IDEATION: "Do you ever have thoughts of hurting or killing yourself?" If Yes, ask:  "Do you have these feelings now?" "Do you have a plan on how you would do this?"     no 8. TREATMENT:  "What has been done so far to treat this anxiety?" (e.g., medicines, relaxation strategies). "What has helped?"     Meds 9. TREATMENT - THERAPIST: "Do you have a counselor or therapist? Name?"     "I just signed up with [facility]" 10. POTENTIAL TRIGGERS: "Do you drink caffeinated beverages (e.g., coffee, colas, teas), and how much daily?" "Do you drink alcohol or use any drugs?" "Have you started any new medicines recently?"       Has not drank coffee today,  and feels like heart is racing 11. PATIENT SUPPORT: "Who is with you now?" "Who do you live with?" "Do you have family or friends who you can talk to?"        Yes, I have people to talk to 62. OTHER SYMPTOMS: "Do you have any other symptoms?" (e.g., feeling depressed, trouble concentrating, trouble sleeping, trouble breathing, palpitations or fast heartbeat, chest pain, sweating, nausea, or diarrhea)       Fast heartbeat, chest pain - "my heart  has been racing" "feels like I just ran a marathon" "I'm not having any heart attack sx" "I'm just ridiculously anxious, more than I'm used to"  Protocols used: Anxiety and Panic Attack-A-AH

## 2023-11-22 NOTE — Telephone Encounter (Signed)
 Copied from CRM [id number]. Topic: Clinical - Prescription Issue >> Nov 22, 2023  9:28 AM Theodis Sato wrote: Reason for CRM: Patient is requesting Dr. Clent Ridges or Dr. Swaziland to please send her diazepam (VALIUM) 5 MG [facility]  at  [address], [place], [place] [address]  Phone: [phone]  *Patient is having multiple panic attacks and cannot sleep, she is out of town for work and is very concerned that no one will help her with this. Patient was transferred to nurse triage.*

## 2023-11-22 NOTE — Telephone Encounter (Signed)
 [facility]

## 2023-11-23 NOTE — Telephone Encounter (Signed)
Pt aware.

## 2023-11-27 ENCOUNTER — Other Ambulatory Visit: Payer: Self-pay | Admitting: Medical Genetics

## 2023-11-27 ENCOUNTER — Ambulatory Visit (INDEPENDENT_AMBULATORY_CARE_PROVIDER_SITE_OTHER): Admitting: Licensed Clinical Social Worker

## 2023-11-27 DIAGNOSIS — F411 Generalized anxiety disorder: Secondary | ICD-10-CM | POA: Diagnosis not present

## 2023-11-27 NOTE — Progress Notes (Signed)
 Comprehensive Clinical Assessment (CCA) Note  11/27/2023 [name] [id number]  Chief Complaint:  Chief Complaint  Patient presents with   Anxiety   Visit Diagnosis: Generalized anxiety disorder CCA Biopsychosocial Intake/Chief Complaint:  patient needs an unbiased person needs back in therapy need to talk to somebody, anxiety, mild depression and sounding board. Anxiety has been off the chain.  Current Symptoms/Problems: see above   Patient Reported Schizophrenia/Schizoaffective Diagnosis in Past: No   Strengths: kind, patient, supportive  Preferences: see above  Abilities: see below for hobbies, personable good with people and dogs   Type of Services Patient Feels are Needed: therapy, med management   Initial Clinical Notes/Concerns: Treatment history-first time in therapy at 12 intermittent were getting divorced and then 1 older intermittent back in and out of therapy and med management-off and on 25 years. 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 Had been in treatment Dr. Clent Ridges not available virtual appointment with colleague. On Xanax weaned off with Valium  then weaning off of Valium multiple things happen had Xanax put back on Xanax self-medicating. 36 hours not taking brain went bananas.5 taking-called in Valium coping better. Needs to get therapy get off benzos can't go cold [place] because of impact on her body. Post Covid deep in therapy reconcile with Dad then caregiver prescribed Xanax to help her. Realized not ok living [place]. Flipped 1 mg Xanax, 10 mg Valium larger number easier to taper it off. Doctor said at least a year. she was committed lost job came back here. Dr. Clent Ridges in process to do it he got uncomfortable hard to find some one feels comfortable but was doing good the taper working needed a couple more months to complete the process. Looking for help to give off the drugs. Recognize  personality change sleep body changed knows Xanax not good need to get back to square one and feel herself again. Depression-cont-not so much the depression but anxiety and possibly the physical impact of benzo. Anxiety-cont-thinks the panic was an addictive response. Talking Ashwaganda. Andaptogens. Holistic herbal approach to care-mushroom. Tried meds for ADHD to help with anxiety great for 6 hours after side effects worse stopped having to live with brain./Here to access what is going on with Mom and what next step. Moved for [employer] last year been here before. Medical issues-now post COVID healthy she thinks, lose a couple pounds. Family history-n/a   Mental Health Symptoms Depression:  None (feels like has something working :"Dealer" helping her ADHD brain, energy through the day. On and off SSRI done with them only increase desire to eat, sit on couch. Has Wellbutrin not taking it.)   Duration of Depressive symptoms: n/a  Mania:  None   Anxiety:   Worrying; Difficulty concentrating; Sleep; Fatigue; Restlessness; Tension (thinks get rid of Xanax symptoms decrease thinks it is 90% of symptoms.)   Psychosis:  None   Duration of Psychotic symptoms: n/a  Trauma:  None   Obsessions:  None   Compulsions:  None   Inattention:  None   Hyperactivity/Impulsivity:  None   Oppositional/Defiant Behaviors:  none  Emotional Irregularity:  None   Other Mood/Personality Symptoms:  none   Mental Status Exam Appearance and self-care  Stature:  Tall   Weight:  Average weight   Clothing:  Casual   Grooming:  Normal   Cosmetic use:  Age appropriate   Posture/gait:  Normal   Motor activity:  Not  Remarkable   Sensorium  Attention:  Normal   Concentration:  Normal   Orientation:  X5   Recall/memory:  Normal   Affect and Mood  Affect:  Anxious   Mood:  Anxious   Relating  Eye contact:  None   Facial expression:  Responsive   Attitude toward examiner:  Cooperative    Thought and Language  Speech flow: Pressured   Thought content:  Appropriate to Mood and Circumstances   Preoccupation:  None   Hallucinations:  None   Organization: Circumstantial tangential logical  [employer] of Knowledge:  Average   Intelligence:  Average   Abstraction:  Normal   Judgement:  Fair   Dance movement psychotherapist:  Realistic   Insight:  Fair   Decision Making:  Normal   Social Functioning  Social Maturity:  Isolates   Social Judgement:  Normal; "Street Smart"   Stress  Stressors:  Illness; Family conflict; Work (caregiver)   Coping Ability:  Overwhelmed; Exhausted   Skill Deficits:  -- (getting life back to normal)   Supports:  Support needed (family member lean on when know needs to be in therapy. Lives with 81 year old mother)     Religion: Religion/Spirituality Are You A Religious Person?: Yes What is Your Religious Affiliation?: Christian How Might This Affect Treatment?: n/a  Leisure/Recreation: Leisure / Recreation Do You Have Hobbies?: Yes Leisure and Hobbies: hasn't done hobbies in a long time. Embarrassed not dating lives with mom salary not as much, caregiver.  Exercise/Diet: Exercise/Diet Do You Exercise?: Yes What Type of Exercise Do You Do?: Run/Walk, Swimming (yoga) How Many Times a Week Do You Exercise?: 1-3 times a week Have You Gained or Lost A Significant Amount of Weight in the Past Six Months?: Yes-Gained Number of Pounds Gained: 10 (got COVID put on weight also messed with brain. Doctor says long-term COVID not sure what that will look like.) Do You Follow a Special Diet?: Yes Type of Diet: healthy Do You Have Any Trouble Sleeping?: Yes Explanation of Sleeping Difficulties: insomnia for a long time. 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 Patient's Job has Been Impacted by Current Illness: Yes Describe how Patient's Job has Been Impacted: COVID and mental health missed days of work. 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?: No  Education: Education Is Patient Currently Attending School?: No Last Grade Completed: 14 Name of High School: [employer] Did You Graduate From [employer]?: Yes Did Theme park manager?: Yes What Type of College Degree Do you Have?: Associate-Business Did You Attend Graduate School?: No Did You Have Any Special Interests In School?: business Did You Have An Individualized Education Program (IIEP): No Did You Have Any Difficulty At School?: Yes (ADD, dyslexia hold down a job brain taught to do short cuts but school was difficult-ADHD/ADD dyslexia) Were Any Medications Ever Prescribed For These Difficulties?: No Patient's Education Has Been Impacted by Current Illness: No   CCA Family/Childhood History Family and Relationship History: Family history Marital status: Divorced Divorced, when?: long time ago  2000 What types of issues is patient dealing with in the relationship?: n/a-healed for most relationship went to therapy wants to date who wants to date when living with mom a whole other conversation Are you sexually active?: No What is your sexual orientation?: Heterosexual Has your sexual activity been affected by drugs, alcohol, medication, or emotional stress?: n/a Does patient have children?: No  Childhood History:  Childhood History By whom was/is the patient raised?: Both parents Additional childhood history information: mom  and stepfather both parents remarried. Saw dad a couple times a year periodically not primary in their life. Childhood great. 4 care gives. Description of patient's relationship with caregiver when they were a child: good Patient's description of current relationship with people who raised him/her: doesn't have caregiver lives mom relationship is complicated. How were you disciplined when you got in trouble as a child/adolescent?: n/a Does patient have siblings?: Yes Number of Siblings: 4 Description of patient's current relationship with siblings: 1 younger brother younger step siblings but not raised in the same home. Gets along good continued relationship even though their parents gone Did patient suffer any verbal/emotional/physical/sexual abuse as a child?: No Did patient suffer from severe childhood neglect?: No Has patient ever been sexually abused/assaulted/raped as an adolescent or adult?: No Was the patient ever a victim of a crime or a disaster?: No Witnessed domestic violence?: No Has patient been affected by domestic violence as an adult?: No  Child/Adolescent Assessment: n/a     CCA Substance Use Alcohol/Drug Use: Alcohol / Drug Use Pain Medications: n/a Prescriptions: see MAR Over the Counter: see MAR History of alcohol / drug use?: No history of alcohol / drug abuse                         ASAM's:  Six Dimensions of Multidimensional Assessment  Dimension 1:  Acute Intoxication and/or Withdrawal Potential:      Dimension 2:  Biomedical Conditions and Complications:      Dimension 3:  Emotional, Behavioral, or Cognitive Conditions and Complications:     Dimension 4:  Readiness to Change:     Dimension 5:  Relapse, Continued use, or Continued Problem Potential:     Dimension 6:  Recovery/Living Environment:     ASAM Severity Score:    ASAM Recommended Level of Treatment:     Substance use Disorder (SUD)-n/a    Recommendations for  Services/Supports/Treatments: Recommendations for Services/Supports/Treatments Recommendations For Services/Supports/Treatments: Individual Therapy, Medication Management  DSM5 Diagnoses: Patient Active Problem List   Diagnosis Date Noted   Insomnia 07/15/2020   ADHD (attention deficit hyperactivity disorder), 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   Migraines 01/07/2020   Low back pain 01/30/2019   Environmental allergies 12/06/2017   Asthma 12/06/2017   Depression, recurrent (HCC) 07/10/2017   Anxiety and depression 07/10/2017   Bilateral ovarian cysts 06/13/2017   Herpes simplex 06/13/2017    Patient Centered Plan: Patient is on the following Treatment Plan(s):  Anxiety-stress management, getting life back to normal-need to finish questionnaires treatment plan next session   Referrals to Alternative Service(s): Referred to Alternative Service(s):   Place:   Date:   Time:    Referred to Alternative Service(s):   Place:   Date:   Time:    Referred to Alternative Service(s):   Place:   Date:   Time:    Referred to Alternative Service(s):   Place:   Date:   Time:      Collaboration of Care: Other  review of visit with doctor last note  Patient/Guardian was advised Release of Information must be obtained prior to any record release in order to collaborate their care with an outside provider. Patient/Guardian was advised if they have not already done so to contact the registration department to sign all necessary forms in order for [place] to release information regarding their care.   Consent: Patient/Guardian gives verbal consent for treatment and assignment of benefits for services provided during this visit. Patient/Guardian expressed understanding and agreed to proceed.   Coolidge Breeze, LCSW

## 2023-11-28 ENCOUNTER — Encounter: Payer: Self-pay | Admitting: Family Medicine

## 2023-11-28 ENCOUNTER — Ambulatory Visit (INDEPENDENT_AMBULATORY_CARE_PROVIDER_SITE_OTHER): Payer: BC Managed Care – PPO | Admitting: Family Medicine

## 2023-11-28 VITALS — BP 98/60 | HR 71 | Temp 98.1°F | Ht 66.25 in | Wt 134.4 lb

## 2023-11-28 DIAGNOSIS — Z0000 Encounter for general adult medical examination without abnormal findings: Secondary | ICD-10-CM

## 2023-11-28 DIAGNOSIS — E039 Hypothyroidism, unspecified: Secondary | ICD-10-CM

## 2023-11-28 DIAGNOSIS — M810 Age-related osteoporosis without current pathological fracture: Secondary | ICD-10-CM | POA: Diagnosis not present

## 2023-11-28 DIAGNOSIS — Z131 Encounter for screening for diabetes mellitus: Secondary | ICD-10-CM | POA: Diagnosis not present

## 2023-11-28 DIAGNOSIS — Z13220 Encounter for screening for lipoid disorders: Secondary | ICD-10-CM | POA: Diagnosis not present

## 2023-11-28 DIAGNOSIS — F419 Anxiety disorder, unspecified: Secondary | ICD-10-CM | POA: Diagnosis not present

## 2023-11-28 LAB — CBC WITH DIFFERENTIAL/PLATELET
Basophils Absolute: 0 10*3/uL (ref 0.0–0.1)
Basophils Relative: 0.7 % (ref 0.0–3.0)
Eosinophils Absolute: 0.1 10*3/uL (ref 0.0–0.7)
Eosinophils Relative: 1.6 % (ref 0.0–5.0)
HCT: 40.8 % (ref 36.0–46.0)
Hemoglobin: 13.4 g/dL (ref 12.0–15.0)
Lymphocytes Relative: 22.6 % (ref 12.0–46.0)
Lymphs Abs: 1.2 10*3/uL (ref 0.7–4.0)
MCHC: 32.9 g/dL (ref 30.0–36.0)
MCV: 92.1 fl (ref 78.0–100.0)
Monocytes Absolute: 0.6 10*3/uL (ref 0.1–1.0)
Monocytes Relative: 12.2 % — ABNORMAL HIGH (ref 3.0–12.0)
Neutro Abs: 3.3 10*3/uL (ref 1.4–7.7)
Neutrophils Relative %: 62.9 % (ref 43.0–77.0)
Platelets: 303 10*3/uL (ref 150.0–400.0)
RBC: 4.44 Mil/uL (ref 3.87–5.11)
RDW: 14.2 % (ref 11.5–15.5)
WBC: 5.2 10*3/uL (ref 4.0–10.5)

## 2023-11-28 LAB — T3, FREE: T3, Free: 3.5 pg/mL (ref 2.3–4.2)

## 2023-11-28 LAB — LIPID PANEL
Cholesterol: 228 mg/dL — ABNORMAL HIGH (ref 0–200)
HDL: 64.3 mg/dL (ref >39.00)
LDL Cholesterol: 148 mg/dL — ABNORMAL HIGH (ref 0–99)
NonHDL: 163.33
Total CHOL/HDL Ratio: 4
Triglycerides: 75 mg/dL (ref 0.0–149.0)
VLDL: 15 mg/dL (ref 0.0–40.0)

## 2023-11-28 LAB — BASIC METABOLIC PANEL
BUN: 9 mg/dL (ref 6–23)
CO2: 26 meq/L (ref 19–32)
Calcium: 9.5 mg/dL (ref 8.4–10.5)
Chloride: 106 meq/L (ref 96–112)
Creatinine, Ser: 0.82 mg/dL (ref 0.40–1.20)
GFR: 80.12 mL/min (ref >60.00)
Glucose, Bld: 94 mg/dL (ref 70–99)
Potassium: 4 meq/L (ref 3.5–5.1)
Sodium: 140 meq/L (ref 135–145)

## 2023-11-28 LAB — TSH: TSH: 1.09 u[IU]/mL (ref 0.35–5.50)

## 2023-11-28 LAB — T4, FREE: Free T4: 0.81 ng/dL (ref 0.60–1.60)

## 2023-11-28 LAB — HEPATIC FUNCTION PANEL
ALT: 11 U/L (ref 0–35)
AST: 16 U/L (ref 0–37)
Albumin: 3.9 g/dL (ref 3.5–5.2)
Alkaline Phosphatase: 104 U/L (ref 39–117)
Bilirubin, Direct: 0.1 mg/dL (ref 0.0–0.3)
Total Bilirubin: 0.5 mg/dL (ref 0.2–1.2)
Total Protein: 6.9 g/dL (ref 6.0–8.3)

## 2023-11-28 LAB — HEMOGLOBIN A1C: Hgb A1c MFr Bld: 5.6 % (ref 4.6–6.5)

## 2023-11-28 MED ORDER — DIAZEPAM 5 MG PO TABS
5.0000 mg | ORAL_TABLET | Freq: Three times a day (TID) | ORAL | 0 refills | Status: DC | PRN
Start: 2023-11-28 — End: 2024-01-09

## 2023-11-28 NOTE — Progress Notes (Signed)
 Subjective:    Patient ID: [name], female    DOB: 1966-09-17, 58 y.o.   MRN: [id number]  HPI Here for a well exam. She feels well in general. She has been dealing with a lot of stress, and she has been using some Valium to help with this. She is scheduled to see Dr. Thresa Love next week for a psychiatric consultation, and she is meeting with [name] for therapy.    Review of Systems  Constitutional: Negative.   HENT: Negative.    Eyes: Negative.   Respiratory: Negative.    Cardiovascular: Negative.   Gastrointestinal: Negative.   Genitourinary:  Negative for decreased urine volume, difficulty urinating, dyspareunia, dysuria, enuresis, flank pain, frequency, hematuria, pelvic pain and urgency.  Musculoskeletal: Negative.   Skin: Negative.   Neurological: Negative.  Negative for headaches.  Psychiatric/Behavioral:  Negative for agitation, behavioral problems, confusion, decreased concentration, dysphoric mood, hallucinations and sleep disturbance. The patient is nervous/anxious.        Objective:   Physical Exam Constitutional:      General: She is not in acute distress.    Appearance: Normal appearance. She is well-developed.  HENT:     Head: Normocephalic and atraumatic.     Right Ear: External ear normal.     Left Ear: External ear normal.     Nose: Nose normal.     Mouth/Throat:     Pharynx: No oropharyngeal exudate.  Eyes:     General: No scleral icterus.    Conjunctiva/sclera: Conjunctivae normal.     Pupils: Pupils are equal, round, and reactive to light.  Neck:     Thyroid: No thyromegaly.     Vascular: No JVD.  Cardiovascular:     Rate and Rhythm: Normal rate and regular rhythm.     Pulses: Normal pulses.     Heart sounds: Normal heart sounds. No murmur heard.    No friction rub. No gallop.  Pulmonary:     Effort: Pulmonary effort is normal. No respiratory distress.     Breath sounds: Normal breath sounds. No wheezing or rales.  Chest:      Chest wall: No tenderness.  Abdominal:     General: Bowel sounds are normal. There is no distension.     Palpations: Abdomen is soft. There is no mass.     Tenderness: There is no abdominal tenderness. There is no guarding or rebound.  Musculoskeletal:        General: No tenderness. Normal range of motion.     Cervical back: Normal range of motion and neck supple.  Lymphadenopathy:     Cervical: No cervical adenopathy.  Skin:    General: Skin is warm and dry.     Findings: No erythema or rash.  Neurological:     General: No focal deficit present.     Mental Status: She is alert and oriented to person, place, and time.     Cranial Nerves: No cranial nerve deficit.     Motor: No abnormal muscle tone.     Coordination: Coordination normal.     Deep Tendon Reflexes: Reflexes are normal and symmetric. Reflexes normal.  Psychiatric:        Mood and Affect: Mood normal.        Behavior: Behavior normal.        Thought Content: Thought content normal.        Judgment: Judgment normal.           Assessment & Plan:  Well exam. We discussed diet and exercise. Get fasting labs. Set up a DEXA. Set up a colonoscopy.  Gershon Crane, MD

## 2023-11-29 ENCOUNTER — Telehealth: Payer: Self-pay

## 2023-11-29 NOTE — Telephone Encounter (Signed)
Reviewed lab results with pt verbalized understanding 

## 2023-11-29 NOTE — Telephone Encounter (Signed)
This message has been resolved.

## 2023-11-29 NOTE — Telephone Encounter (Signed)
 Copied from CRM [id number]. Topic: Clinical - Lab/Test Results >> Nov 29, 2023 12:20 PM [name] wrote: Reason for CRM: Patient called to return a missed call from [place] about her lab results. I contacted [facility] to try to connect the patient to Nurse Harriett Sine but they had me on an extensive hold. Please follow up with patient to discuss lab results [phone]

## 2023-11-30 ENCOUNTER — Other Ambulatory Visit (HOSPITAL_COMMUNITY): Payer: Self-pay

## 2023-12-04 ENCOUNTER — Other Ambulatory Visit (HOSPITAL_COMMUNITY): Payer: Self-pay

## 2023-12-05 ENCOUNTER — Ambulatory Visit (HOSPITAL_COMMUNITY): Payer: 59 | Admitting: Psychiatry

## 2023-12-06 ENCOUNTER — Other Ambulatory Visit (HOSPITAL_COMMUNITY): Payer: Self-pay

## 2023-12-07 ENCOUNTER — Encounter (HOSPITAL_COMMUNITY): Payer: Self-pay | Admitting: Psychiatry

## 2023-12-07 ENCOUNTER — Ambulatory Visit (HOSPITAL_COMMUNITY): Admitting: Psychiatry

## 2023-12-07 VITALS — BP 90/76 | HR 91 | Ht 66.25 in | Wt 131.0 lb

## 2023-12-07 DIAGNOSIS — F410 Panic disorder [episodic paroxysmal anxiety] without agoraphobia: Secondary | ICD-10-CM

## 2023-12-07 DIAGNOSIS — F411 Generalized anxiety disorder: Secondary | ICD-10-CM

## 2023-12-07 DIAGNOSIS — F3341 Major depressive disorder, recurrent, in partial remission: Secondary | ICD-10-CM | POA: Diagnosis not present

## 2023-12-07 NOTE — Progress Notes (Signed)
 Psychiatric Initial Adult Assessment   Patient Identification: [name] MRN:  [id number] Date of Evaluation:  12/07/2023 Referral Source: primary care Chief Complaint:   Chief Complaint  Patient presents with   New Patient (Initial Visit)   Visit Diagnosis:    ICD-10-CM   1. Generalized anxiety disorder  F41.1     2. Panic attacks  F41.0     3. MDD (major depressive disorder), recurrent, in partial remission (HCC)  F33.41       History of Present Illness: Patient is a 58 years old currently single or divorced Caucasian female has relocated from [place] to live with her mom who is 72 years of age to help her out.  She recently lost her dad and she was caregiving for him.  Patient does not have any kids she is working full-time as a Ship broker referred by primary care physician to establish care for anxiety  Patient gives a long history of episodes of depression and anxiety.  She has had episodes of depression in the past with multiple treatment of medication including SSRI, SNRI and different medication but apparently has not continued or felt it has not worked.  In regarding depression she feels euthymic but more so anxious does not endorse hopelessness depression on a day-to-day basis some disturbed sleep and appetite but not having any crying spells or significant symptoms of depression  She gives a long history of having anxiety, excessive worries at times panic-like feeling.  When the panic attack comes in she feels more worry full.  She has been on Xanax by her past provider a small dose of 0.25 mg but she understands the risk and has been tapering it off.  There was a time that she was not taking Xanax but her anxiety reoccurred and since she has not benefited from other medication she was given Valium.  She is here to discuss how to get off the benzodiazepine she has got 30 tablets from her primary care physician of Valium 5 mg it does help and she is planning to  taper off.  Other medication including hydroxyzine, trazodone and other medication discussed and reviewed but she feels that they do the opposite.  She still endorses worries, excessive worries worries about her situation living with her mom which is challenging and complicated because her mom nags her on a lot of things.  Patient is trying to get independent living that will help her and reduce her stress level.  There is no psychotic symptoms currently in the past there is no manic symptoms currently or in the past There is no past suicide attempt  Aggravating factors; living with mom, death of her dad. Modifying factors; her friend, her work   Duration adult life  Severity more so anxiety side and worries, excessive worries circumstantial worries but denies depression  Denies drug use Associated Signs/Symptoms: Depression Symptoms:  difficulty concentrating, anxiety, panic attacks, (Hypo) Manic Symptoms:  Distractibility, Anxiety Symptoms:  Excessive Worry, Panic Symptoms,  Past Psychiatric History: depression, anxiety  Previous Psychotropic Medications: Yes  SSRI , SNRI  Substance Abuse History in the last 12 months:  No.  Consequences of Substance Abuse: NA  Past Medical History:  Past Medical History:  Diagnosis Date   Anxiety    Asthma    seasonal   Headache    Pelvic pain     Past Surgical History:  Procedure Laterality Date   ABDOMINAL EXPOSURE N/A 01/30/2019   Procedure: ABDOMINAL EXPOSURE;  Surgeon: Coral Else  W, MD;  [facility];  Service: Vascular;  Laterality: N/A;   ANTERIOR LUMBAR FUSION N/A 01/30/2019   Procedure: Anterior Lumbar Interbody Fusion L5-S1;  Surgeon: Venita Lick, MD;  [facility]: [facility];  Service: Orthopedics;  Laterality: N/A;    CERVICAL BIOPSY  W/ LOOP ELECTRODE EXCISION     NOV 2014   CHOLECYSTECTOMY     LAPAROSCOPIC SALPINGO OOPHERECTOMY Bilateral 03/16/2017   Procedure: LAPAROSCOPIC SALPINGO OOPHORECTOMY;  Surgeon:  Dara Lords, MD;  Location:  SURGERY CENTER;  Service: Gynecology;  Laterality: Bilateral;   OPEN REDUCTION INTERNAL FIXATION (ORIF) DISTAL RADIAL FRACTURE Right 05/02/2018   Procedure: OPEN REDUCTION INTERNAL FIXATION (ORIF) DISTAL RADIAL FRACTURE;  Surgeon: Bradly Bienenstock, MD;  [facility];  Service: Orthopedics;  Laterality: Right;   PELVIC LAPAROSCOPY      Family Psychiatric History: mom : anxiety, depression  Family History:  Family History  Problem Relation Age of Onset   Hypertension Mother    Anxiety disorder Mother    Depression Mother    Breast cancer Other        GREAT AUNT   ADD / ADHD Father     Social History:   Social History   Socioeconomic History   Marital status: Divorced    Spouse name: Not on file   Number of children: Not on file   Years of education: Not on file   Highest education level: Associate degree: occupational, Scientist, product/process development, or vocational program  Occupational History   Not on file  Tobacco Use   Smoking status: Never   Smokeless tobacco: Never  Vaping Use   Vaping status: Never Used  Substance and Sexual Activity   Alcohol use: Not Currently    Alcohol/week: 1.0 standard drink of alcohol    Types: 1 Glasses of wine per week   Drug use: No   Sexual activity: Not Currently    Birth control/protection: Post-menopausal  Other Topics Concern   Not on file  Social History Narrative   Not on file   Social Drivers of Health   Financial Resource Strain: Medium Risk (11/21/2023)   Overall Financial Resource Strain (CARDIA)    Difficulty of Paying Living Expenses: Somewhat hard  Food Insecurity: Food Insecurity Present (11/21/2023)   Hunger Vital Sign    Worried About Running Out of Food in the Last Year: Sometimes true    Ran Out of Food in the Last Year: Sometimes true  Transportation Needs: No Transportation Needs (11/21/2023)   PRAPARE - Administrator, Civil Service (Medical): No    Lack of Transportation  (Non-Medical): No  Physical Activity: Insufficiently Active (11/21/2023)   Exercise Vital Sign    Days of Exercise per Week: 4 days    Minutes of Exercise per Session: 30 min  Stress: Stress Concern Present (11/21/2023)   [employer] of Occupational Health - Occupational Stress Questionnaire    Feeling of Stress : Very much  Social Connections: Moderately Integrated (11/21/2023)   Social Connection and Isolation Panel [NHANES]    Frequency of Communication with Friends and Family: More than three times a week    Frequency of Social Gatherings with Friends and Family: More than three times a week    Attends Religious Services: More than 4 times per year    Active Member of [employer] or Organizations: Yes    Attends Banker Meetings: 1 to 4 times per year    Marital Status: Divorced    Additional Social History:  grew up with mom, parents were divorced.works as TEFL teacher, 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  Allergies:   Allergies  Allergen Reactions   Azithromycin Hives    Metabolic Disorder Labs: Lab Results  Component Value Date   HGBA1C 5.6 11/28/2023   No results found for: "PROLACTIN" Lab Results  Component Value Date   CHOL 228 (H) 11/28/2023   TRIG 75.0 11/28/2023   HDL 64.30 11/28/2023   CHOLHDL 4 11/28/2023   VLDL 15.0 11/28/2023   LDLCALC 148 (H) 11/28/2023   LDLCALC 139 (H) 01/13/2021   Lab Results  Component Value Date   TSH 1.09 11/28/2023    Therapeutic Level Labs: No results found for: "LITHIUM" No results found for: "CBMZ" No results found for: "VALPROATE"  Current Medications: Current Outpatient Medications  Medication Sig Dispense Refill   albuterol (VENTOLIN HFA) 108 (90 Base) MCG/ACT inhaler Inhale 2 puffs into the lungs every 6 (six) hours as needed for wheezing or shortness of breath. 8 g 0   diazepam (VALIUM) 5 MG tablet Take 1 tablet (5 mg total) by mouth every 8 (eight) hours as needed for anxiety. 30 tablet 0   fluticasone (FLONASE) 50  MCG/ACT nasal spray SPRAY 2 SPRAYS INTO EACH NOSTRIL EVERY DAY 48 mL 3   Fluticasone Propionate, Inhal, (FLUTICASONE PROPIONATE DISKUS) 100 MCG/ACT AEPB Inhale 1 puff into the lungs daily. 60 each 11   ibuprofen (ADVIL) 800 MG tablet TAKE 1 TABLET BY MOUTH EVERY 8 HOURS AS NEEDED FOR MODERATE PAIN. **NEED NEW INSURANCE** 90 tablet 1   montelukast (SINGULAIR) 10 MG tablet TAKE 1 TABLET BY MOUTH EVERYDAY AT BEDTIME 90 tablet 3   promethazine (PHENERGAN) 25 MG tablet Take 1 tablet (25 mg total) by mouth every 6 (six) hours as needed for nausea or vomiting. 60 tablet 2   rizatriptan (MAXALT-MLT) 10 MG disintegrating tablet TAKE 1 TABLET BY MOUTH AS NEEDED FOR MIGRAINE. *APPOINTMENT REQUIRED FOR FUTURE REFILLS* 12 tablet 0   valACYclovir (VALTREX) 500 MG tablet TAKE 1 TABLET BY MOUTH TWICE A DAY 180 tablet 3   No current facility-administered medications for this visit.     Psychiatric Specialty Exam: Review of Systems  Cardiovascular:  Negative for chest pain.  Neurological:  Negative for tremors.  Psychiatric/Behavioral:  Negative for self-injury. The patient is nervous/anxious.     Blood pressure 90/76, pulse 91, height 5' 6.25" (1.683 m), weight 131 lb (59.4 kg), last menstrual period 02/19/2009.Body mass index is 20.98 kg/m.  General Appearance: Casual  Eye Contact:  Fair  Speech:  Normal Rate  Volume:  Normal  Mood:  Anxious  Affect:  Constricted  Thought Process:  Goal Directed  Orientation:  Full (Time, Place, and Person)  Thought Content:  Rumination  Suicidal Thoughts:  No  Homicidal Thoughts:  No  Memory:  Immediate;   Fair  Judgement:  Fair  Insight:  Fair  Psychomotor Activity:  Decreased  Concentration:  Concentration: Fair  Recall:  [employer] of Knowledge:Good  Language: Good  Akathisia:  No  Handed:    AIMS (if indicated):  not done  Assets:  Desire for Improvement  ADL's:  Intact  Cognition: WNL  Sleep:  Fair   Screenings: GAD-7    Flowsheet Row Office  Visit from 12/07/2023 in [facility] Office Visit from 05/02/2023 in [facility] [place] [facility] [place] Office Visit from 10[facility]24/2022 in [facility] [place] Office Visit from 12/07/2020 in [facility]  [place]  Total GAD-7 Score 11 4 0 8      PHQ2-9    Flowsheet Row Office Visit from 12/07/2023 in [facility] Office Visit from 05/02/2023 in [facility] [place] Office Visit from 04/18/2023 in [facility] [place] [facility] [place] Office Visit from 10/24/2022 in [facility] [place] Office Visit from 08/03/2022 in [facility] [place]  PHQ-2 Total Score 3 1 3  0 2  PHQ-9 Total Score 8 5 12  0 7      Flowsheet Row Office Visit from 12/07/2023 in [facility] at [facility]  C-SSRS RISK CATEGORY No Risk       Assessment and Plan: as follows  Generalized anxiety disorder; discussed SSRIs and other options as of now she is considering putting her to be off from the benzodiazepine.  She is 30 tablets and we discussed the plan to taper off and then take every other day and then take every other half a tablet so that she can taper down the next 1-1/2 to 68-month.  Also she started therapy so that she can work on Pharmacologist and not be relying on medication.  She does not want to be on other medication either once she is off benzodiazepines so that was the focus and we discussed tapering down dose and distraction from negative thoughts  Panic attacks; continue therapy she is on Valium with a taper dose have tried other medications. Her circumstances are challenging once she is still able to live independently and away from her mom that would help she relocated to take care of her mom but she wants to live nearby but not with  her  Reviewed sleep hygiene reviewed medication as of now she will taper down well in the next 1 to 2 months I will see her back within 4 to 5 weeks call earlier if concerns and we can discuss and review other medications if need to otherwise with the coping skills from therapy and circumstances if they are changed that to help with anxiety as well  Patient not psychotic or suicidal agrees with the plan reviewed medication and risks  Direct care time spent 60 minutes building chart review, face-to-face documentation and collaboration if any   Collaboration of Care: Primary Care Provider AEB reviewed notes and chart   Patient/Guardian was advised Release of Information must be obtained prior to any record release in order to collaborate their care with an outside provider. Patient/Guardian was advised if they have not already done so to contact the registration department to sign all necessary forms in order for [place] to release information regarding their care.   Consent: Patient/Guardian gives verbal consent for treatment and assignment of benefits for services provided during this visit. Patient/Guardian expressed understanding and agreed to proceed.   Thresa Ross, MD 3/13/20259:32 AM

## 2023-12-11 ENCOUNTER — Other Ambulatory Visit (HOSPITAL_COMMUNITY)
Admission: RE | Admit: 2023-12-11 | Discharge: 2023-12-11 | Disposition: A | Discharge status: Home / Self Care | Payer: Self-pay | Source: Ambulatory Visit | Attending: Medical Genetics | Admitting: Medical Genetics

## 2023-12-11 DIAGNOSIS — M810 Age-related osteoporosis without current pathological fracture: Secondary | ICD-10-CM | POA: Insufficient documentation

## 2023-12-12 ENCOUNTER — Encounter (HOSPITAL_COMMUNITY): Payer: Self-pay

## 2023-12-12 ENCOUNTER — Ambulatory Visit (HOSPITAL_COMMUNITY): Admitting: Licensed Clinical Social Worker

## 2023-12-12 DIAGNOSIS — F3341 Major depressive disorder, recurrent, in partial remission: Secondary | ICD-10-CM

## 2023-12-12 DIAGNOSIS — F411 Generalized anxiety disorder: Secondary | ICD-10-CM

## 2023-12-12 DIAGNOSIS — F410 Panic disorder [episodic paroxysmal anxiety] without agoraphobia: Secondary | ICD-10-CM | POA: Diagnosis not present

## 2023-12-12 NOTE — Progress Notes (Signed)
 THERAPIST PROGRESS NOTE  Session Time: 2:00 PM to  2:45 PM  Participation Level: Active  Behavioral Response: CasualAlertAnxious  Type of Therapy: Individual Therapy  Treatment Goals addressed: Patient wants an outlet to talk to someone who is on biased, professional to talk about personal and work life as well as working on Pharmacologist for anxiety, understanding choices as a caregiver coping in general  ProgressTowards Goals: Initial-created treatment plan today patient gave consent to complete in session verbally but started to work on treatment goals patient processing thoughts and feelings in session about different facets of her life helpful for therapist to know more to be able to provide helpful interventions as well as helping to process thoughts and feelings in session  Interventions: Solution Focused, Strength-based, Supportive, and Other: coping  Summary: [name] is a 58 y.o. female who presents with purpose for second session and therapist noted she seems calmer.  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 There is a none profit part.  Patient explains does things like give scholarship taking people even to go around the world both of [place] thought that was cool as patient was a stewardess likes to travel and therapist also likes to travel used to be a Editor, commissioning.  Therapist shared look at doctors note seems there will be a taper. Patient says the side effects of Xanax worse than benefits brain though still addicted has a taper. Wants off of benzos reviewed was at a place brain space where work bananas, worried since have taken funding away and uncertainly about what is next. Wants somebody to talk to about work and family life next steps for mom and now to handle it. Somebody not biased not family  or friends and doesn't know people in her life. Find ways to manage anxiety while getting off of benzos. Work is pivoting to what is coming understand capable people in [place] mindful.  Therapist talked about her approach to combination of talking worksheets patient says has ADD, dyslexia add to anxiety with worksheets what she wants is use therapy for talking to help cope.. Diagnosis for 20 years not something new. What works for her talking and being active. Missed [place] going to beach every day. Talked about coping with boss has learned to give her minute patient is fairly new they may be moving her around.  Finds therapy a safe place secure conversations in a safe place with mom feels good that this is possible. Mom is showing signs that other arrangements need to be made.  Best about talking to her doctors as a first step in patients that has not gotten there yet their relationship is complicated her Mom sees her as elder, as parent as spouse, caretaker she will lean on that for patient to take care of her 150%. Encouraging her autonomy good for her mind. Dynamic with Mom is different than Dad. When start to clean she barks at her, doing wrong, her insecurity everything has to be perfect. Insecure identify of her mom's Unfounded good looking, smart, insecurities of things cause her to have to be perfect drive patient insane have to be there to take care of her.   This is her second session a chance to get to know each other better completed questionnaires treatment plan patient wants to focus on  having someone to talk to about stressors at home and at work also coping strategies for anxiety.  She knows about her diagnosis so know some things about her symptoms and coping strategies still needs the outlet to be able to have a safe place to talk about different issues.  Therapist explored what is worked for her with anxiety she says keep busy and talking.  Talked about dynamic with mom helpful to understand  more as treatment interventions will involve coping in this relationship as patient puts that she is insecure which shows up like perfectionism and takes it out on patient what we are looking at is needing do fine alternative plans for mom as she continues to age.  Therapist provided space and support for patient to talk about thoughts and feelings in session. Suicidal/Homicidal: No  Plan: Return again in 2 weeks.2.  Continue to work on coping for anxiety processing thoughts and feelings in session to help with coping  Diagnosis: Generalized Anxiety Disorder, panic attacks, Major Depressive Disorder,  recurrent in partial remission  Collaboration of Care: Medication Management AEB review of Dr. Gilmore Laroche note  Patient/Guardian was advised Release of Information must be obtained prior to any record release in order to collaborate their care with an outside provider. Patient/Guardian was advised if they have not already done so to contact the registration department to sign all necessary forms in order for [place] to release information regarding their care.   Consent: Patient/Guardian gives verbal consent for treatment and assignment of benefits for services provided during this visit. Patient/Guardian expressed understanding and agreed to proceed.   Coolidge Breeze, LCSW 12/12/2023

## 2023-12-18 ENCOUNTER — Ambulatory Visit (HOSPITAL_COMMUNITY): Payer: 59 | Admitting: Licensed Clinical Social Worker

## 2023-12-19 LAB — GENECONNECT MOLECULAR SCREEN: Genetic Analysis Overall Interpretation: NEGATIVE

## 2023-12-26 ENCOUNTER — Ambulatory Visit (HOSPITAL_COMMUNITY): Admitting: Licensed Clinical Social Worker

## 2023-12-27 ENCOUNTER — Telehealth (HOSPITAL_COMMUNITY): Admitting: Psychiatry

## 2023-12-27 ENCOUNTER — Encounter (HOSPITAL_COMMUNITY): Payer: Self-pay

## 2023-12-28 ENCOUNTER — Ambulatory Visit (HOSPITAL_COMMUNITY): Admitting: Psychiatry

## 2023-12-29 ENCOUNTER — Encounter: Payer: Self-pay | Admitting: Gastroenterology

## 2024-01-01 ENCOUNTER — Telehealth: Admitting: Family Medicine

## 2024-01-01 ENCOUNTER — Ambulatory Visit (HOSPITAL_COMMUNITY): Admitting: Licensed Clinical Social Worker

## 2024-01-01 VITALS — HR 71

## 2024-01-01 DIAGNOSIS — F411 Generalized anxiety disorder: Secondary | ICD-10-CM | POA: Diagnosis not present

## 2024-01-01 DIAGNOSIS — F3341 Major depressive disorder, recurrent, in partial remission: Secondary | ICD-10-CM | POA: Diagnosis not present

## 2024-01-01 DIAGNOSIS — F410 Panic disorder [episodic paroxysmal anxiety] without agoraphobia: Secondary | ICD-10-CM

## 2024-01-01 DIAGNOSIS — J019 Acute sinusitis, unspecified: Secondary | ICD-10-CM | POA: Diagnosis not present

## 2024-01-01 MED ORDER — AMOXICILLIN-POT CLAVULANATE 500-125 MG PO TABS: 1.0000 | ORAL_TABLET | Freq: Two times a day (BID) | ORAL | 0 refills | Status: AC

## 2024-01-01 NOTE — Progress Notes (Signed)
 Virtual Visit via Video Note  I connected with [name] on 01/01/24 at  3:30 PM EDT by a video enabled telemedicine application and verified that I am speaking with the correct person using two identifiers.  Location patient: home Location provider:work or home office Persons participating in the virtual visit: patient, provider  I discussed the limitations of evaluation and management by telemedicine and the availability of in person appointments. The patient expressed understanding and agreed to proceed. Chief Complaint  Patient presents with   Sinus Problem    Sinus pain and pressure, cough, sore throat, Post nasal drip, fever, tooth region pain. Symptoms started over weekend. Has tried singulair, Sudafed, Tylenol, Mucinex, astepro, saline nasal spray.     HPI: Pt is a 58 yo female followed by Dr. Clent Ridges and seen for acute concern.  Pt endorses dealing with allergies x a few wks.  Having pressure/pain in face since the wknd.  Cough was from asthma, now more allergies/postnasal drainage.  Now also having pain in teeth. Pain in forehead and cheeks.  Temp elevated at 21F, typically 93F.  Pt denies ST, N/V, diarrhea.  Tried Astepro, saline spray, and an OTC allergy med.  Requesting Augmentin.  ROS: See pertinent positives and negatives per HPI.  Past Medical History:  Diagnosis Date   Anxiety    Asthma    seasonal   Headache    Pelvic pain     Past Surgical History:  Procedure Laterality Date   ABDOMINAL EXPOSURE N/A 01/30/2019   Procedure: ABDOMINAL EXPOSURE;  Surgeon: Nada Libman, MD;  [facility];  Service: Vascular;  Laterality: N/A;   ANTERIOR LUMBAR FUSION N/A 01/30/2019   Procedure: Anterior Lumbar Interbody Fusion L5-S1;  Surgeon: Venita Lick, MD;  [facility];  Service: Orthopedics;  Laterality: N/A;    CERVICAL BIOPSY  W/ LOOP ELECTRODE EXCISION     NOV 2014   CHOLECYSTECTOMY     LAPAROSCOPIC SALPINGO OOPHERECTOMY Bilateral 03/16/2017    Procedure: LAPAROSCOPIC SALPINGO OOPHORECTOMY;  Surgeon: Dara Lords, MD;  Location: [facility];  Service: Gynecology;  Laterality: Bilateral;   OPEN REDUCTION INTERNAL FIXATION (ORIF) DISTAL RADIAL FRACTURE Right 05/02/2018   Procedure: OPEN REDUCTION INTERNAL FIXATION (ORIF) DISTAL RADIAL FRACTURE;  Surgeon: Bradly Bienenstock, MD;  [facility];  Service: Orthopedics;  Laterality: Right;   PELVIC LAPAROSCOPY      Family History  Problem Relation Age of Onset   Hypertension Mother    Anxiety disorder Mother    Depression Mother    Breast cancer Other        GREAT AUNT   ADD / ADHD Father    Current Outpatient Medications:    albuterol (VENTOLIN HFA) 108 (90 Base) MCG/ACT inhaler, Inhale 2 puffs into the lungs every 6 (six) hours as needed for wheezing or shortness of breath., Disp: 8 g, Rfl: 0   diazepam (VALIUM) 5 MG tablet, Take 1 tablet (5 mg total) by mouth every 8 (eight) hours as needed for anxiety., Disp: 30 tablet, Rfl: 0   ibuprofen (ADVIL) 800 MG tablet, TAKE 1 TABLET BY MOUTH EVERY 8 HOURS AS NEEDED FOR MODERATE PAIN. **NEED NEW INSURANCE**, Disp: 90 tablet, Rfl: 1   montelukast (SINGULAIR) 10 MG tablet, TAKE 1 TABLET BY MOUTH EVERYDAY AT BEDTIME, Disp: 90 tablet, Rfl: 3   promethazine (PHENERGAN) 25 MG tablet, Take 1 tablet (25 mg total) by mouth every 6 (six) hours as needed for nausea or vomiting., Disp: 60 tablet, Rfl: 2  rizatriptan (MAXALT-MLT) 10 MG disintegrating tablet, TAKE 1 TABLET BY MOUTH AS NEEDED FOR MIGRAINE. *APPOINTMENT REQUIRED FOR FUTURE REFILLS*, Disp: 12 tablet, Rfl: 0   valACYclovir (VALTREX) 500 MG tablet, TAKE 1 TABLET BY MOUTH TWICE A DAY, Disp: 180 tablet, Rfl: 3  EXAM:  VITALS per patient if applicable: RR  between 12-20 bpm  GENERAL: alert, oriented, appears well and in no acute distress  HEENT: atraumatic, conjunctiva clear, no obvious abnormalities on inspection of external nose and ears  NECK: normal movements of the  head and neck  LUNGS: on inspection no signs of respiratory distress, breathing rate appears normal, no obvious gross SOB, gasping or wheezing  CV: no obvious cyanosis  MS: moves all visible extremities without noticeable abnormality  PSYCH/NEURO: pleasant and cooperative, no obvious depression or anxiety, speech and thought processing grossly intact  ASSESSMENT AND PLAN:  Discussed the following assessment and plan:  Acute non-recurrent sinusitis, unspecified location - Plan: amoxicillin-clavulanate (AUGMENTIN) 500-125 MG tablet  Sinusitits. Start abx. Continue supportive meds.  F/u with pcp.   I discussed the assessment and treatment plan with the patient. The patient was provided an opportunity to ask questions and all were answered. The patient agreed with the plan and demonstrated an understanding of the instructions.   The patient was advised to call back or seek an in-person evaluation if the symptoms worsen or if the condition fails to improve as anticipated.   Deeann Saint, MD

## 2024-01-01 NOTE — Progress Notes (Signed)
 THERAPIST PROGRESS NOTE  Session Time: 11:00 AM to 11:45 AM  Participation Level: Active  Behavioral Response: CasualAlertAnxious and Euthymic  Type of Therapy: Individual Therapy  Treatment Goals addressed: Generalized Anxiety Disorder, panic attacks, Major Depressive Disorder,  recurrent in partial remission  ProgressTowards Goals: Progressing-patient experiencing stress at work helpful to process thoughts and feelings to help with coping  Interventions: Solution Focused, Strength-based, Supportive, and Other: coping  Summary: [name] is a 58 y.o. female who presents with doing better weaning off of meds is doing well. Not ready to be off but doing better. Off Xanax side effects worse than benefits. Diazepam are less side effects and weaning off of it. Sometimes a full pill 5 mg most time 2.5 hope to be off it by [employer] day. Doesn't want rush it and then have her brain freak out. Want to do outpatient so doesn't go inpatient. Sense of accomplishment but having a rough time at work. Don't think a good mix for them. Respect therapist's job and it is a coincidence part of the credentialing body for therapy. 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   She feels that she is being forced out. Needs validation. Therapist noted after patient sharing couple stories what happened therapist perspective this job she knows what she is doing. Worked at [employer] and was intense. Not wired for the sale. People pleaser.  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 Likes taking care of people. 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 Now healed and healthy trying to get back on.  Described the many hoops to go through.  Talked to former supervision [employer] after conversation. Getting back with them dream come true it would be an opportunity going forward if hired back to seniority and pay she was  making. Hopeful position at [employer] opening contacted people she knows. Would like to be flight attendant job in [place] if comes up take it. Needs to get out of current situation.  Any chance to berate her they will. Don't take it personally doing good job. Good feedback about from attendees. The boss yelling after that good feedback. Not taking it personally because been through it.   Assess helpful session for patient noted progress with weaning off to medication feels good about that accomplishment.  Needed to talk about stressors of job therapist providing validation processing thoughts and feelings to help with coping.  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  Assessed with stress work she needs an outlet to verbalize her feelings have someone listen and provide supportive strength-based intervention Suicidal/Homicidal: No  Plan: Return again in 1 week.2.  Work on strategies for anxiety can be stressors related to her mom to current work situation explored good coping strategies  Diagnosis: Generalized Anxiety Disorder, panic attacks, Major Depressive Disorder,  recurrent in partial remission  Collaboration of Care: Other none needed  Patient/Guardian was advised Release of Information must be obtained prior to any record release in order to collaborate their care with an outside provider. Patient/Guardian was advised if they have not already done so to contact the registration department to sign all necessary forms in order for [place] to release information regarding their care.   Consent: Patient/Guardian gives verbal consent for treatment  and assignment of benefits for services provided during this visit. Patient/Guardian expressed understanding and agreed to proceed.   Coolidge Breeze, LCSW 01/01/2024

## 2024-01-02 ENCOUNTER — Encounter (HOSPITAL_COMMUNITY): Payer: Self-pay | Admitting: Psychiatry

## 2024-01-02 ENCOUNTER — Ambulatory Visit (INDEPENDENT_AMBULATORY_CARE_PROVIDER_SITE_OTHER): Admitting: Psychiatry

## 2024-01-02 DIAGNOSIS — F410 Panic disorder [episodic paroxysmal anxiety] without agoraphobia: Secondary | ICD-10-CM | POA: Diagnosis not present

## 2024-01-02 DIAGNOSIS — F3341 Major depressive disorder, recurrent, in partial remission: Secondary | ICD-10-CM | POA: Diagnosis not present

## 2024-01-02 DIAGNOSIS — F411 Generalized anxiety disorder: Secondary | ICD-10-CM

## 2024-01-02 NOTE — Progress Notes (Signed)
 [facility] Follow up visit  Patient Identification: [name] MRN:  [id number] Date of Evaluation:  01/02/2024 Referral Source: primary care Chief Complaint:   No chief complaint on file. Follow up anxiety  Visit Diagnosis:    ICD-10-CM   1. Generalized anxiety disorder  F41.1     2. Panic attacks  F41.0     3. MDD (major depressive disorder), recurrent, in partial remission (HCC)  F33.41       History of Present Illness: Patient is a 58 years old currently single or divorced Caucasian female has relocated from [place] to live with her mom who is 58 years of age to help her out.  She recently lost her dad and she was caregiving for him.  Patient does not have any kids she is working full-time as a Ship broker initially referred by primary care physician to establish care for anxiety  Patient gives a long history of episodes of depression and anxiety.  She has had episodes of depression in the past with multiple treatment of medication including SSRI, SNRI and different medication but apparently has not continued or felt it has not worked.  She has tapered off xanax slowly, still on valium and is planning to taper off with memorial day weekend as target We discussed in detail how to take It every other day and then half every other day  Anxiety is manageble and is working to taper valium  Aggravating factors; living with mom, dad's death Modifying factors; her friend,her work   Duration; adult life  Severity manageable  Past Psychiatric History: depression, anxiety  Previous Psychotropic Medications: Yes  SSRI , SNRI  Substance Abuse History in the last 12 months:  No.  Consequences of Substance Abuse: NA  Past Medical History:  Past Medical History:  Diagnosis Date   Anxiety    Asthma    seasonal   Headache    Pelvic pain     Past Surgical History:  Procedure Laterality Date   ABDOMINAL EXPOSURE N/A 01/30/2019   Procedure: ABDOMINAL EXPOSURE;  Surgeon:  Nada Libman, MD;  Location: [facility];  Service: Vascular;  Laterality: N/A;   ANTERIOR LUMBAR FUSION N/A 01/30/2019   Procedure: Anterior Lumbar Interbody Fusion L5-S1;  Surgeon: Venita Lick, MD;  Location: [facility];  Service: Orthopedics;  Laterality: N/A;    CERVICAL BIOPSY  W/ LOOP ELECTRODE EXCISION     NOV 2014   CHOLECYSTECTOMY     LAPAROSCOPIC SALPINGO OOPHERECTOMY Bilateral 03/16/2017   Procedure: LAPAROSCOPIC SALPINGO OOPHORECTOMY;  Surgeon: Dara Lords, MD;  Location: [facility];  Service: Gynecology;  Laterality: Bilateral;   OPEN REDUCTION INTERNAL FIXATION (ORIF) DISTAL RADIAL FRACTURE Right 05/02/2018   Procedure: OPEN REDUCTION INTERNAL FIXATION (ORIF) DISTAL RADIAL FRACTURE;  Surgeon: Bradly Bienenstock, MD;  Location: [facility];  Service: Orthopedics;  Laterality: Right;   PELVIC LAPAROSCOPY      Family Psychiatric History: mom : anxiety, depression  Family History:  Family History  Problem Relation Age of Onset   Hypertension Mother    Anxiety disorder Mother    Depression Mother    Breast cancer Other        GREAT AUNT   ADD / ADHD Father     Social History:   Social History   Socioeconomic History   Marital status: Divorced    Spouse name: Not on file   Number of children: Not on file   Years of education: Not on file   Highest education  level: Associate degree: occupational, Scientist, product/process development, or vocational program  Occupational History   Not on file  Tobacco Use   Smoking status: Never   Smokeless tobacco: Never  Vaping Use   Vaping status: Never Used  Substance and Sexual Activity   Alcohol use: Not Currently    Alcohol/week: 1.0 standard drink of alcohol    Types: 1 Glasses of wine per week   Drug use: No   Sexual activity: Not Currently    Birth control/protection: Post-menopausal  Other Topics Concern   Not on file  Social History Narrative   Not on file   Social Drivers of Health   Financial Resource Strain: Medium  Risk (11/21/2023)   Overall Financial Resource Strain (CARDIA)    Difficulty of Paying Living Expenses: Somewhat hard  Food Insecurity: Food Insecurity Present (11/21/2023)   Hunger Vital Sign    Worried About Running Out of Food in the Last Year: Sometimes true    Ran Out of Food in the Last Year: Sometimes true  Transportation Needs: No Transportation Needs (11/21/2023)   PRAPARE - Administrator, Civil Service (Medical): No    Lack of Transportation (Non-Medical): No  Physical Activity: Insufficiently Active (11/21/2023)   Exercise Vital Sign    Days of Exercise per Week: 4 days    Minutes of Exercise per Session: 30 min  Stress: Stress Concern Present (11/21/2023)   [employer] of Occupational Health - Occupational Stress Questionnaire    Feeling of Stress : Very much  Social Connections: Moderately Integrated (11/21/2023)   Social Connection and Isolation Panel [NHANES]    Frequency of Communication with Friends and Family: More than three times a week    Frequency of Social Gatherings with Friends and Family: More than three times a week    Attends Religious Services: More than 4 times per year    Active Member of [employer] or Organizations: Yes    Attends Banker Meetings: 1 to 4 times per year    Marital Status: Divorced    Allergies:   Allergies  Allergen Reactions   Azithromycin Hives    Metabolic Disorder Labs: Lab Results  Component Value Date   HGBA1C 5.6 11/28/2023   No results found for: "PROLACTIN" Lab Results  Component Value Date   CHOL 228 (H) 11/28/2023   TRIG 75.0 11/28/2023   HDL 64.30 11/28/2023   CHOLHDL 4 11/28/2023   VLDL 15.0 11/28/2023   LDLCALC 148 (H) 11/28/2023   LDLCALC 139 (H) 01/13/2021   Lab Results  Component Value Date   TSH 1.09 11/28/2023    Therapeutic Level Labs: No results found for: "LITHIUM" No results found for: "CBMZ" No results found for: "VALPROATE"  Current Medications: Current Outpatient  Medications  Medication Sig Dispense Refill   albuterol (VENTOLIN HFA) 108 (90 Base) MCG/ACT inhaler Inhale 2 puffs into the lungs every 6 (six) hours as needed for wheezing or shortness of breath. 8 g 0   amoxicillin-clavulanate (AUGMENTIN) 500-125 MG tablet Take 1 tablet by mouth in the morning and at bedtime for 7 days. 14 tablet 0   diazepam (VALIUM) 5 MG tablet Take 1 tablet (5 mg total) by mouth every 8 (eight) hours as needed for anxiety. 30 tablet 0   ibuprofen (ADVIL) 800 MG tablet TAKE 1 TABLET BY MOUTH EVERY 8 HOURS AS NEEDED FOR MODERATE PAIN. **NEED NEW INSURANCE** 90 tablet 1   montelukast (SINGULAIR) 10 MG tablet TAKE 1 TABLET BY MOUTH EVERYDAY AT BEDTIME 90  tablet 3   promethazine (PHENERGAN) 25 MG tablet Take 1 tablet (25 mg total) by mouth every 6 (six) hours as needed for nausea or vomiting. 60 tablet 2   rizatriptan (MAXALT-MLT) 10 MG disintegrating tablet TAKE 1 TABLET BY MOUTH AS NEEDED FOR MIGRAINE. *APPOINTMENT REQUIRED FOR FUTURE REFILLS* 12 tablet 0   valACYclovir (VALTREX) 500 MG tablet TAKE 1 TABLET BY MOUTH TWICE A DAY 180 tablet 3   No current facility-administered medications for this visit.     Psychiatric Specialty Exam: Review of Systems  Neurological:  Negative for tremors.  Psychiatric/Behavioral:  Negative for self-injury.     Last menstrual period 02/19/2009.There is no height or weight on file to calculate BMI.  General Appearance: Casual  Eye Contact:  Fair  Speech:  Normal Rate  Volume:  Normal  Mood: better   Affect:  Constricted  Thought Process:  Goal Directed  Orientation:  Full (Time, Place, and Person)  Thought Content:  Rumination  Suicidal Thoughts:  No  Homicidal Thoughts:  No  Memory:  Immediate;   Fair  Judgement:  Fair  Insight:  Fair  Psychomotor Activity:  Decreased  Concentration:  Concentration: Fair  Recall:  [employer] of Knowledge:Good  Language: Good  Akathisia:  No  Handed:    AIMS (if indicated):  not done   Assets:  Desire for Improvement  ADL's:  Intact  Cognition: WNL  Sleep:  Fair   Screenings: GAD-7    Flowsheet Row Office Visit from 12/07/2023 in [facility] Office Visit from 05/02/2023 in [facility] [place] Office Visit from 10/24/2022 in [facility] [place] Office Visit from 12/07/2020 in [facility] [place]  Total GAD-7 Score 11 4 0 8      PHQ2-9    Flowsheet Row Telemedicine from 01/01/2024 in [facility] [address] [facility] [place] Counselor from 12/12/2023 in [facility] Office Visit from 12/07/2023 in [facility] Office Visit from 05/02/2023 in [facility] [place] [facility] [place] Office Visit from 04/18/2023 in [facility] [place] [facility] [place]  PHQ-2 Total Score 0 3 3 1 3   PHQ-9 Total Score 1 8 8 5 12       Flowsheet Row Counselor from 12/12/2023 in [facility] Office Visit from 12/07/2023 in [facility]  C-SSRS RISK CATEGORY No Risk No Risk       Assessment and Plan: as follows  Prior documentation reviewed  Generalized anxiety disorder; on valium and beginning to taper, has been on SSRI didn't work Not on xanax any more Discussed taper of valium it may take more then a month or two . Slow taper off She does not want to be on other medication either once she is off benzodiazepines so that was the focus and we discussed tapering down dose and distraction from negative thoughts  Panic attacks; sporadic , not on xanax and is planning to taper valium  Direct care time spent 20  minutes building chart review, face-to-face documentation and collaboration if any  FU 2-3 m.    Collaboration of Care: Primary Care  Provider AEB reviewed notes and chart   Patient/Guardian was advised Release of Information must be obtained prior to any record release in order to collaborate their care with an outside provider. Patient/Guardian was advised if they  have not already done so to contact the registration department to sign all necessary forms in order for [place] to release information regarding their care.   Consent: Patient/Guardian gives verbal consent for treatment and assignment of benefits for services provided during this visit. Patient/Guardian expressed understanding and agreed to proceed.   Thresa Ross, MD 4/8/20259:19 AM

## 2024-01-08 ENCOUNTER — Ambulatory Visit (HOSPITAL_COMMUNITY): Admitting: Licensed Clinical Social Worker

## 2024-01-09 ENCOUNTER — Ambulatory Visit: Admitting: Family Medicine

## 2024-01-09 ENCOUNTER — Encounter: Payer: Self-pay | Admitting: Family Medicine

## 2024-01-09 VITALS — BP 102/70 | HR 82 | Temp 98.2°F | Wt 132.0 lb

## 2024-01-09 DIAGNOSIS — F419 Anxiety disorder, unspecified: Secondary | ICD-10-CM | POA: Diagnosis not present

## 2024-01-09 MED ORDER — DIAZEPAM 5 MG PO TABS: 5.0000 mg | ORAL_TABLET | Freq: Three times a day (TID) | ORAL | 0 refills | Status: DC | PRN

## 2024-01-09 NOTE — Progress Notes (Signed)
   Subjective:    Patient ID: [name], female    DOB: 03/05/1966, 58 y.o.   MRN: [id number]  HPI Here to follow up on anxiety. She has been taking Diazepam as needed, and she has been trying to wean herself off of it. We gave her #30 Diazepam pills on 11-29-22, and she picked up another #7 on 12-26-23. She is meeting with a therapist. She has also met twice with Dr. Wray Heady of psychiatry in the past few weeks. He has not prescribed any medication for her, but instead he asked her to follow up with us . She wants to stop the Diazepam, but she is not sure that she is ready.    Review of Systems  Constitutional: Negative.   Respiratory: Negative.    Cardiovascular: Negative.   Psychiatric/Behavioral:  Negative for dysphoric mood and sleep disturbance. The patient is nervous/anxious.        Objective:   Physical Exam Constitutional:      Appearance: Normal appearance.  Cardiovascular:     Rate and Rhythm: Normal rate and regular rhythm.     Pulses: Normal pulses.     Heart sounds: Normal heart sounds.  Pulmonary:     Effort: Pulmonary effort is normal.     Breath sounds: Normal breath sounds.  Neurological:     Mental Status: She is alert.  Psychiatric:        Mood and Affect: Mood normal.        Behavior: Behavior normal.        Thought Content: Thought content normal.           Assessment & Plan:  Anxiety. She is slowly weaning herself off Diazepam. We wrote her another #7 tablets to use as needed, and she will return in one month. Tracy Diego, MD

## 2024-01-10 ENCOUNTER — Encounter: Payer: Self-pay | Admitting: Family Medicine

## 2024-01-15 ENCOUNTER — Ambulatory Visit (HOSPITAL_COMMUNITY): Admitting: Licensed Clinical Social Worker

## 2024-01-15 ENCOUNTER — Other Ambulatory Visit: Payer: Self-pay | Admitting: Adult Health

## 2024-01-15 DIAGNOSIS — F411 Generalized anxiety disorder: Secondary | ICD-10-CM | POA: Diagnosis not present

## 2024-01-15 DIAGNOSIS — F410 Panic disorder [episodic paroxysmal anxiety] without agoraphobia: Secondary | ICD-10-CM

## 2024-01-15 DIAGNOSIS — G43809 Other migraine, not intractable, without status migrainosus: Secondary | ICD-10-CM

## 2024-01-15 NOTE — Progress Notes (Signed)
 THERAPIST PROGRESS NOTE  Session Time: 11:00 AM to 11:45 AM  Participation Level: Active  Behavioral Response: CasualAlertEuthymic  Type of Therapy: Individual Therapy  Treatment Goals addressed: Patient wants an outlet to talk to someone who is on biased, professional to talk about personal and work life as well as working on Pharmacologist for anxiety, understanding choices as a caregiver coping in general  ProgressTowards Goals: Progressing-patient continues to use therapy as an outlet for coping dealing with stressors being laid off has taken positive steps noted also achievement of weaning off benzos and feels better  Interventions: Solution Focused, Strength-based, Supportive, and Other: coping  Summary: [name] is a 58 y.o. female who presents with laid off, will have severance, has applied for unemployment. Last week grilling her knew what was happening. Boss didn't have audacity to say one word CEO and human resources amazingly nice. Director of [employer] said reach out has contacts and use as a Theatre stage manager. Talking to recruiters from outside the country. Represent temp services find resume and use AI match to jobs. One has called her a lot would be for event planner. Told her yes put in her resume. Wants to work at [employer] has more control of her schedule. Surprised Mom handled it as well last couple years erratic when left [employer] she she is used to handling it. Used to erratic job market. She has been sick last two weeks things tampered down getting along better than a long time. That is good could go to friend's house but sharing cost with mom. Good things are better between the two of them. Don't qualify for [facility] but go into Medicaid.  Psychiatrist ok with weaning off didn't give perimeters. PCP wrote for 7 days. Wrote a note to him by the way, told him laid off of job might be seeing her soon. Right now 1/2 pill 2.5 mg Diazepam , every other day. Weaning herself  off, she was happy. [employer] flushed Xanax  it not keeping it around for security blanket.  Therapist provided positive reinforcement as this positive step. Patient says over it Doctor is telling her that she is doing it right now giving her what need for brain. Happy that getting done with getting off of benzodiazepines need find a job wants to find one that fits her job skills. [employer] job excited has the Pension scheme manager to do the job not sure now if will get it. Was very bored at last job. A matter of looking past B.A, doesn't have, to present her because can do job. Certain employers said doesn't matter at certain level have the experience what matters. Fried jumping from job to job also doesn't look good even when not your fault recommendation from HR is going to be helpful.  Patient laid off something she expected although going from the job therapist can relate tired of that.  Talked about process of looking for work but helpful patient framed it like a game as it can be challenging.  Challenging as well with hoops you have to go through as helpful patient has been through this helps her with coping so to get support from last job will help her get recommendation.  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  Continues to put in resume and respond to what is available.  Noted getting along with mom as positive therapist noted one of the issues that she  had coming in so this is positive as well as patient saying she is happy or coming off benzos therapist noted this is something she would note to in terms of progress.  Patient proud of her self reflection the Xanax  and therapist is to and she is coming off of the benzos and feeling better very positive step for patient therapist processed thoughts and feelings to help with coping.  Suicidal/Homicidal: No  Plan: Return again in 3 weeks.2. Work on strategies for anxiety can be stressors related to  her mom to current work situation explored good coping strategies  Diagnosis: eneralized Anxiety Disorder, panic attacks, Major Depressive Disorder,  recurrent in partial remission  Collaboration of Care: Other none needed  Patient/Guardian was advised Release of Information must be obtained prior to any record release in order to collaborate their care with an outside provider. Patient/Guardian was advised if they have not already done so to contact the registration department to sign all necessary forms in order for us  to release information regarding their care.   Consent: Patient/Guardian gives verbal consent for treatment and assignment of benefits for services provided during this visit. Patient/Guardian expressed understanding and agreed to proceed.   Dallie Duel, LCSW 01/15/2024

## 2024-01-16 ENCOUNTER — Encounter: Payer: Self-pay | Admitting: Family Medicine

## 2024-01-17 ENCOUNTER — Other Ambulatory Visit: Payer: Self-pay | Admitting: Nurse Practitioner

## 2024-01-17 DIAGNOSIS — U071 COVID-19: Secondary | ICD-10-CM

## 2024-01-19 NOTE — Telephone Encounter (Signed)
 FYI

## 2024-01-19 NOTE — Telephone Encounter (Signed)
 Please confirm if this order got to [facility]

## 2024-01-19 NOTE — Telephone Encounter (Signed)
 Noted.

## 2024-01-22 ENCOUNTER — Ambulatory Visit (AMBULATORY_SURGERY_CENTER)

## 2024-01-22 ENCOUNTER — Other Ambulatory Visit: Payer: Self-pay | Admitting: Family Medicine

## 2024-01-22 VITALS — Ht 66.0 in | Wt 127.0 lb

## 2024-01-22 DIAGNOSIS — Z1211 Encounter for screening for malignant neoplasm of colon: Secondary | ICD-10-CM

## 2024-01-22 MED ORDER — RIZATRIPTAN BENZOATE 10 MG PO TBDP: 10.0000 mg | ORAL_TABLET | ORAL | 11 refills | Status: AC | PRN

## 2024-01-22 MED ORDER — NA SULFATE-K SULFATE-MG SULF 17.5-3.13-1.6 GM/177ML PO SOLN: 1.0000 | Freq: Once | ORAL | 0 refills | Status: AC

## 2024-01-22 NOTE — Progress Notes (Signed)

## 2024-01-24 ENCOUNTER — Telehealth: Payer: Self-pay | Admitting: Family Medicine

## 2024-01-24 DIAGNOSIS — M810 Age-related osteoporosis without current pathological fracture: Secondary | ICD-10-CM

## 2024-01-24 NOTE — Telephone Encounter (Signed)
 I will write a RX for this so we can fax it to [facility]

## 2024-01-24 NOTE — Telephone Encounter (Signed)
 Copied from CRM [id number]. Topic: Referral - Request for Referral >> Jan 24, 2024  1:30 PM Alysia Jumbo S wrote: Did the patient discuss referral with their provider in the last year? Yes (If No - schedule appointment) (If Yes - send message)  Appointment offered? No  Type of order/referral and detailed reason for visit: Bone density test,  Preference of office, provider, location: [facility] -[address]  If referral order, have you been seen by this specialty before? Yes (If Yes, this issue or another issue? When? Where?  Can we respond through MyChart? Yes

## 2024-01-25 NOTE — Telephone Encounter (Signed)
 Rx faxed to [facility], pt notified

## 2024-01-30 ENCOUNTER — Encounter: Payer: Self-pay | Admitting: Gastroenterology

## 2024-02-01 ENCOUNTER — Ambulatory Visit: Payer: Self-pay

## 2024-02-01 NOTE — Telephone Encounter (Signed)
 Chief Complaint: anxiety Symptoms: anxiety, panic attack last night Frequency: x 3 days Pertinent Negatives: Patient denies thoughts or plans of self harm Disposition: [] ED /[] Urgent Care (no appt availability in office) / [x] Appointment(In office/virtual)/ []  [facility]/ [] Home Care/ [] Refused Recommended Disposition /[] [facility]/ []  Follow-up with PCP Additional Notes: Patient states she is interested in starting on SSRI. She states she has weaned off her benzodiazepines. Last night she states she had a panic attack. She states she is feeling slightly better today but still moderately anxious. Patient agreeable to acute visit tomorrow with PCP.  Copied from CRM [id number]. Topic: Clinical - Red Word Triage >> Feb 01, 2024  2:26 PM Kita Perish H wrote: [facility] that prompted transfer to Nurse Triage: Patient having mental health issues. Reason for Disposition  MODERATE anxiety (e.g., persistent or frequent anxiety symptoms; interferes with sleep, school, or work)  Answer Assessment - Initial Assessment Questions 1. CONCERN: "Did anything happen that prompted you to call today?"      She states she has weaned herself off her Valium , it has been a few weeks. She feels likes her mind doesn't know how to handle it yet and she had an anxiety attack last night. She states she got laid off her job 3 weeks ago which has added stress.  2. ANXIETY SYMPTOMS: "Can you describe how you (your loved one; patient) have been feeling?" (e.g., tense, restless, panicky, anxious, keyed up, overwhelmed, sense of impending doom).      She states she was crying last night, difficult time explaining it. States she feels like she can not calm down.  3. ONSET: "How long have you been feeling this way?" (e.g., hours, days, weeks)    X 3 days.  4. SEVERITY: "How would you rate the level of anxiety?" (e.g., 0 - 10; or mild, moderate, severe).     Moderate.  5. FUNCTIONAL IMPAIRMENT: "How have  these feelings affected your ability to do daily activities?" "Have you had more difficulty than usual doing your normal daily activities?" (e.g., getting better, same, worse; self-care, school, work, interactions)     She states she is performing normal daily activities, she is her mother's caregiver.  6. HISTORY: "Have you felt this way before?" "Have you ever been diagnosed with an anxiety problem in the past?" (e.g., generalized anxiety disorder, panic attacks, PTSD). If Yes, ask: "How was this problem treated?" (e.g., medicines, counseling, etc.)     Past history of anxiety.  7. RISK OF HARM - SUICIDAL IDEATION: "Do you ever have thoughts of hurting or killing yourself?" If Yes, ask:  "Do you have these feelings now?" "Do you have a plan on how you would do this?"     Denies.  8. TREATMENT:  "What has been done so far to treat this anxiety?" (e.g., medicines, relaxation strategies). "What has helped?"     Medications, relaxation strategies and states both have helped in the past.  9. TREATMENT - THERAPIST: "Do you have a counselor or therapist? Name?"     Counselor's name is Adriana Hopping and her psychiatrist is Dr Aram Knights.  10. POTENTIAL TRIGGERS: "Do you drink caffeinated beverages (e.g., coffee, colas, teas), and how much daily?" "Do you drink alcohol or use any drugs?" "Have you started any new medicines recently?"       1 cup of coffee daily. Denies alcohol or drug use.   11. PATIENT SUPPORT: "Who is with you now?" "Who do you live with?" "Do you have family  or friends who you can talk to?"        She stays with her mother and is her caregiver.  12. OTHER SYMPTOMS: "Do you have any other symptoms?" (e.g., feeling depressed, trouble concentrating, trouble sleeping, trouble breathing, palpitations or fast heartbeat, chest pain, sweating, nausea, or diarrhea)       Vomiting x 1 episode Tuesday night   13. PREGNANCY: "Is there any chance you are pregnant?" "When was your last menstrual period?"        N/A.  Protocols used: Anxiety and Panic Attack-A-AH

## 2024-02-02 ENCOUNTER — Ambulatory Visit (INDEPENDENT_AMBULATORY_CARE_PROVIDER_SITE_OTHER): Admitting: Family Medicine

## 2024-02-02 ENCOUNTER — Encounter: Payer: Self-pay | Admitting: Family Medicine

## 2024-02-02 VITALS — BP 100/70 | HR 86 | Temp 98.2°F | Wt 132.6 lb

## 2024-02-02 DIAGNOSIS — F32A Depression, unspecified: Secondary | ICD-10-CM

## 2024-02-02 DIAGNOSIS — F419 Anxiety disorder, unspecified: Secondary | ICD-10-CM | POA: Diagnosis not present

## 2024-02-02 MED ORDER — HYDROXYZINE HCL 10 MG PO TABS: 10.0000 mg | ORAL_TABLET | Freq: Three times a day (TID) | ORAL | 2 refills | Status: DC | PRN

## 2024-02-02 MED ORDER — BUPROPION HCL ER (XL) 150 MG PO TB24: 150.0000 mg | ORAL_TABLET | Freq: Every day | ORAL | 2 refills | Status: DC

## 2024-02-02 MED ORDER — DIAZEPAM 5 MG PO TABS: 5.0000 mg | ORAL_TABLET | Freq: Three times a day (TID) | ORAL | 2 refills | Status: DC | PRN

## 2024-02-02 NOTE — Progress Notes (Signed)
   Subjective:    Patient ID: [name], female    DOB: 1966/07/23, 58 y.o.   MRN: [id number]  HPI Here for help with anxiety. She was laid off from her job this week, and she has been struggling with anxiety and worrying about her future. She has been using Diazepam  and Hydroxyzine , but she thinks she should get back on a daily medication. She sees Dallie Duel for therapy, and she will see Dr. Wray Heady in 2 weeks for psychiatric care. She has trouble sleeping and eating.    Review of Systems  Constitutional: Negative.   Respiratory: Negative.    Cardiovascular: Negative.   Psychiatric/Behavioral:  Positive for decreased concentration, dysphoric mood and sleep disturbance. Negative for agitation, behavioral problems, confusion, hallucinations, self-injury and suicidal ideas. The patient is nervous/anxious.        Objective:   Physical Exam Constitutional:      Appearance: Normal appearance.  Cardiovascular:     Rate and Rhythm: Normal rate and regular rhythm.     Pulses: Normal pulses.     Heart sounds: Normal heart sounds.  Pulmonary:     Effort: Pulmonary effort is normal.     Breath sounds: Normal breath sounds.  Neurological:     Mental Status: She is alert.  Psychiatric:        Behavior: Behavior normal.        Thought Content: Thought content normal.     Comments: Anxious            Assessment & Plan:  Anxiety with depression. She will start back on Wellbutrin  XL 150 mg daily. Add Diazepam  and Hydroxyzine  as needed. Follow up with Dr. Aram Knights.  Tracy Diego, MD

## 2024-02-02 NOTE — Telephone Encounter (Signed)
 Pt  seen by Dr Alyne Babinski this morning for this problem

## 2024-02-06 ENCOUNTER — Ambulatory Visit (HOSPITAL_COMMUNITY): Admitting: Licensed Clinical Social Worker

## 2024-02-08 ENCOUNTER — Telehealth: Payer: Self-pay | Admitting: Gastroenterology

## 2024-02-08 NOTE — Telephone Encounter (Signed)
 Thank you for the update.  It was a screening exam, so no urgency.  Memory Staggers MD

## 2024-02-08 NOTE — Telephone Encounter (Signed)
 Good morning Dr. 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 Patient was rescheduled for June July the 22 nd at 9:30 am. Please advise.

## 2024-02-09 ENCOUNTER — Encounter: Admitting: Gastroenterology

## 2024-02-20 ENCOUNTER — Ambulatory Visit (HOSPITAL_COMMUNITY): Admitting: Licensed Clinical Social Worker

## 2024-02-24 ENCOUNTER — Other Ambulatory Visit: Payer: Self-pay | Admitting: Family Medicine

## 2024-02-27 ENCOUNTER — Ambulatory Visit (HOSPITAL_COMMUNITY): Admitting: Psychiatry

## 2024-03-05 ENCOUNTER — Ambulatory Visit (HOSPITAL_COMMUNITY): Admitting: Licensed Clinical Social Worker

## 2024-03-05 ENCOUNTER — Ambulatory Visit: Admitting: Family Medicine

## 2024-03-05 ENCOUNTER — Other Ambulatory Visit: Payer: Self-pay | Admitting: Family Medicine

## 2024-03-05 DIAGNOSIS — U071 COVID-19: Secondary | ICD-10-CM

## 2024-04-01 ENCOUNTER — Ambulatory Visit: Payer: Self-pay

## 2024-04-01 NOTE — Telephone Encounter (Signed)
 Pt already triaged by HIPOLITO Caldron, RN.   Message from [facility] G sent at 04/01/2024  9:14 AM EDT  Sinus infection: pressure, drainage, coffee, mild fever.. no flu symptons   This encounter was created in error - please disregard.

## 2024-04-01 NOTE — Telephone Encounter (Addendum)
 FYI Only or Action Required?: FYI only for provider.  Patient was last seen in primary care on 02/02/2024 by Johnny Garnette LABOR, MD. Called Nurse Triage reporting Sinusitis. Symptoms began several days ago. Interventions attempted: OTC medications: nasal sprays, sudafed. Symptoms are: gradually worsening.  Triage Disposition: See PCP When Office is Open (Within 3 Days)  Patient/caregiver understands and will follow disposition?: Yes     Copied from CRM [id number]. Topic: Clinical - Pink Word Triage >> Apr 01, 2024  9:13 AM Emylou G wrote: Reason for Triage: Sinus infection: pressure, drainage, coffee, mild fever.. no flu symptons >> Apr 01, 2024  9:14 AM Emylou G wrote: Sinus infection: pressure, drainage, coffee, mild fever.. no flu symptons Reason for Disposition  [1] Sinus congestion (pressure, fullness) AND [2] present > 10 days  Answer Assessment - Initial Assessment Questions 1. LOCATION: Where does it hurt?      Cheekbones, teeth pain 2. ONSET: When did the sinus pain start?  (e.g., hours, days)      A few weeks ago 3. SEVERITY: How bad is the pain?   (Scale 1-10; mild, moderate or severe)   - MILD (1-3): doesn't interfere with normal activities    - MODERATE (4-7): interferes with normal activities (e.g., work or school) or awakens from sleep   - SEVERE (8-10): excruciating pain and patient unable to do any normal activities        6-7/10 sinus pain Has tried nasal spray, saline spray , pseudofed - provides minimal relief 4. RECURRENT SYMPTOM: Have you ever had sinus problems before? If Yes, ask: When was the last time? and What happened that time?      unknown 5. NASAL CONGESTION: Is the nose blocked? If Yes, ask: Can you open it or must you breathe through your mouth?     Drainage to back of throat 6. NASAL DISCHARGE: Do you have discharge from your nose? If so ask, What color?     Yes, unknown color 7. FEVER: Do you have a fever? If Yes, ask: What is it, how  was it measured, and when did it start?      denies 8. OTHER SYMPTOMS: Do you have any other symptoms? (e.g., sore throat, cough, earache, difficulty breathing)     Cough, teeth pain Denies SOB, earache 9. PREGNANCY: Is there any chance you are pregnant? When was your last menstrual period?     N/a  Protocols used: Sinus Pain or Congestion-A-AH

## 2024-04-02 ENCOUNTER — Telehealth: Admitting: Family Medicine

## 2024-04-02 ENCOUNTER — Encounter: Payer: Self-pay | Admitting: Family Medicine

## 2024-04-02 VITALS — Wt 127.0 lb

## 2024-04-02 DIAGNOSIS — J0100 Acute maxillary sinusitis, unspecified: Secondary | ICD-10-CM

## 2024-04-02 MED ORDER — AMOXICILLIN-POT CLAVULANATE 875-125 MG PO TABS
1.0000 | ORAL_TABLET | Freq: Two times a day (BID) | ORAL | 0 refills | Status: AC
Start: 2024-04-02 — End: 2024-04-09

## 2024-04-02 NOTE — Progress Notes (Signed)
   Virtual Medical Office Visit  Patient:  [name]      Age: 58 y.o.       Sex:  female  Date:   04/02/2024  PCP:    Johnny Garnette LABOR, MD   Today's Healthcare Provider: Heron CHRISTELLA Sharper, MD    Assessment/Plan:   Summary assessment:  [name] was seen today for sinus problem and sore throat.  Acute non-recurrent maxillary sinusitis -     Amoxicillin -Pot Clavulanate; Take 1 tablet by mouth 2 (two) times daily for 7 days.  Dispense: 14 tablet; Refill: 0   Face pressure/pain reported, this is similar to her previous sinus infections in the past, states augmentin  does a good job for her usually. Pt has had multiple bouts of sinusitis since moving to [place], has been to the allergist and tried the shots but found them to be cumbersome, will call in abx for her today. RTC PRN.   No follow-ups on file.   She was advised to call the office or go to ER if her condition worsens    Subjective:   [name] is a 58 y.o. female with PMH significant for: Past Medical History:  Diagnosis Date   Allergy    Anxiety    Asthma    seasonal   Headache    Pelvic pain      Presenting today with: Chief Complaint  Patient presents with   Sinus Problem    Patient complains of sinus pressure, congestion, cough and post-nasal drainage x5-6 days, tried Sudafed and Mucinex DM, Astepro nasal spray   Sore Throat    X4 days     She clarifies and reports that her condition: Patient is reporting sore throat, coughing, drainage, states that she sounds more nasally, states that her cheeks and teeth started hurting and she is getting very fatigued. States that there is a lot of pressure in her cheeks. Maybe some subjective fevers, no chills. States she has been staying with her mother and her apartment is damp, maybe her mom is also ill. No recent travel, no exposures to covid, hasn't gone anywhere. No SOB or chest tightness.   She denies having any: Chest pain, difficulty breathing           Objective/Observations  Physical Exam:  Polite and friendly Gen: NAD, resting comfortably, cough is raspy, slightly wheezy, voice is pinched and nasally Pulm: Normal work of breathing Neuro: Grossly normal, moves all extremities Psych: Normal affect and thought content Problem specific physical exam findings:    No images are attached to the encounter or orders placed in the encounter.    Results: No results found for any visits on 04/02/24.   No results found for this or any previous visit (from the past 2160 hours).         Virtual Visit via Video   I connected with [name] on 04/02/24 at  9:00 AM EDT by a video enabled telemedicine application and verified that I am speaking with the correct person using two identifiers. The limitations of evaluation and management by telemedicine and the availability of in person appointments were discussed. The patient expressed understanding and agreed to proceed.   Percentage of appointment time on video:  100% Patient location: Home Provider location: [facility] Persons participating in the virtual visit: Myself and Patient

## 2024-04-03 ENCOUNTER — Other Ambulatory Visit: Payer: Self-pay | Admitting: Family Medicine

## 2024-04-03 DIAGNOSIS — J3089 Other allergic rhinitis: Secondary | ICD-10-CM

## 2024-04-03 DIAGNOSIS — J4520 Mild intermittent asthma, uncomplicated: Secondary | ICD-10-CM

## 2024-04-03 DIAGNOSIS — Z1231 Encounter for screening mammogram for malignant neoplasm of breast: Secondary | ICD-10-CM | POA: Diagnosis not present

## 2024-04-03 DIAGNOSIS — M810 Age-related osteoporosis without current pathological fracture: Secondary | ICD-10-CM | POA: Diagnosis not present

## 2024-04-03 LAB — HM DEXA SCAN

## 2024-04-03 LAB — HM MAMMOGRAPHY

## 2024-04-08 ENCOUNTER — Ambulatory Visit: Admitting: Family Medicine

## 2024-04-10 ENCOUNTER — Encounter: Payer: Self-pay | Admitting: Family Medicine

## 2024-04-10 ENCOUNTER — Ambulatory Visit: Admitting: Family Medicine

## 2024-04-10 VITALS — BP 118/78 | HR 75 | Temp 97.8°F | Wt 132.0 lb

## 2024-04-10 DIAGNOSIS — M810 Age-related osteoporosis without current pathological fracture: Secondary | ICD-10-CM | POA: Diagnosis not present

## 2024-04-10 DIAGNOSIS — J019 Acute sinusitis, unspecified: Secondary | ICD-10-CM | POA: Diagnosis not present

## 2024-04-10 DIAGNOSIS — F419 Anxiety disorder, unspecified: Secondary | ICD-10-CM

## 2024-04-10 MED ORDER — DENOSUMAB 60 MG/ML ~~LOC~~ SOSY: 60.0000 mg | PREFILLED_SYRINGE | Freq: Once | SUBCUTANEOUS | 1 refills | Status: AC

## 2024-04-10 MED ORDER — DIAZEPAM 5 MG PO TABS: 5.0000 mg | ORAL_TABLET | Freq: Three times a day (TID) | ORAL | 5 refills | Status: DC | PRN

## 2024-04-10 MED ORDER — AMOXICILLIN-POT CLAVULANATE 875-125 MG PO TABS: 1.0000 | ORAL_TABLET | Freq: Two times a day (BID) | ORAL | 0 refills | Status: DC

## 2024-04-10 NOTE — Progress Notes (Signed)
   Subjective:    Patient ID: [name], female    DOB: 10/02/1965, 58 y.o.   MRN: [id number]  HPI Here for several issues. First she was seen here on 04-02-24 by Dr. Ozell for a sinus infection., and she was given 7 days of Augmentin . She now feels better, but she thinks some additional doses are needed to take care of the infection. Also she had her first DEXA on 04-05-24, and she was found to have osteoporosis. Here T scores ranged from -3.2 to -4.2. She notes that her mother had osteoporosis. [name] is already taking calcium and vitamin D  daily, and she does weight bearing exercises. She has never smoked. She has researched the treatments for this, and she is very interested in trying Prolia . She does not want to try any of the oral medications.    Review of Systems  Constitutional: Negative.   HENT:  Positive for congestion, postnasal drip and sinus pressure. Negative for ear pain and sore throat.   Eyes: Negative.   Respiratory: Negative.         Objective:   Physical Exam Constitutional:      Appearance: Normal appearance.  HENT:     Right Ear: Tympanic membrane, ear canal and external ear normal.     Left Ear: Tympanic membrane, ear canal and external ear normal.     Nose: Nose normal.     Mouth/Throat:     Pharynx: Oropharynx is clear.  Eyes:     Conjunctiva/sclera: Conjunctivae normal.  Pulmonary:     Effort: Pulmonary effort is normal.     Breath sounds: Normal breath sounds.  Lymphadenopathy:     Cervical: No cervical adenopathy.  Neurological:     Mental Status: She is alert.           Assessment & Plan:  She has a partially treated sinusitis. We will give her an additional 10 days of Augmentin . She has also been diagnosed with osteoporosis. We will start her on Prolia  injections. Repeat a DEXA in 2 years.  Tracy Olmsted, MD

## 2024-04-12 ENCOUNTER — Telehealth: Payer: Self-pay

## 2024-04-12 ENCOUNTER — Other Ambulatory Visit (HOSPITAL_COMMUNITY): Payer: Self-pay

## 2024-04-12 NOTE — Telephone Encounter (Signed)
 Pharmacy Patient Advocate Encounter   Received notification from Onbase that prior authorization for PROLIA  is required/requested.   Insurance verification completed.   The patient is insured through [facility] .   Per test claim: PA required; PA started via CoverMyMeds. KEY [id number] . Please see clinical question(s) below that I am not finding the answer to in their chart and advise.

## 2024-04-15 ENCOUNTER — Telehealth: Payer: Self-pay | Admitting: Gastroenterology

## 2024-04-15 ENCOUNTER — Encounter: Payer: Self-pay | Admitting: Family Medicine

## 2024-04-15 MED ORDER — ALENDRONATE SODIUM 70 MG PO TABS: 70.0000 mg | ORAL_TABLET | ORAL | 0 refills | Status: DC

## 2024-04-15 MED ORDER — FLUCONAZOLE 150 MG PO TABS: 150.0000 mg | ORAL_TABLET | Freq: Every day | ORAL | 5 refills | Status: AC

## 2024-04-15 NOTE — Telephone Encounter (Signed)
 Patient calling stating she has developed a Yeast infection as from taking antibiotic's for a sinus infection she had. Patient is now wanting to know if she can have her colonoscopy done or if she is needing to reschedule. Please advise.

## 2024-04-15 NOTE — Telephone Encounter (Signed)
 Pt states she has a yeast infection, as well as feeling feverish.  Wishes to reschedule. Rescheduled to 05-02-24 at 1:30 pm; new instructions sent via [facility]

## 2024-04-15 NOTE — Telephone Encounter (Signed)
 Done

## 2024-04-15 NOTE — Telephone Encounter (Signed)
 Tell the patient that her insurance requires her to try Alendronate  first (as we expected). I can send this in if she agrees

## 2024-04-15 NOTE — Telephone Encounter (Signed)
 Patient informed of the message below and is aware the Rx for Alendronate  was sent to [facility].

## 2024-04-16 ENCOUNTER — Encounter: Admitting: Gastroenterology

## 2024-04-18 ENCOUNTER — Telehealth: Payer: Self-pay | Admitting: *Deleted

## 2024-04-18 NOTE — Telephone Encounter (Signed)
 Copied from CRM #[id number]. Topic: Clinical - Medication Prior Auth >> Apr 18, 2024  8:46 AM Thersia BROCKS wrote: Reason for CRM: Andriena from [facility] called in regarding prior auth for Prolia  her callback number is   [phone]

## 2024-04-23 ENCOUNTER — Telehealth: Payer: Self-pay

## 2024-04-23 NOTE — Telephone Encounter (Signed)
 Pt Prolia  authorization was sent to Sarah who handles Prolia  PA Copied from CRM [id number]. Topic: General - Other >> Apr 22, 2024 10:35 AM Macario HERO wrote: Reason for CRM: Lauren from [facility] called regarding patient Prolia  prior authorization and said we can call and provide a verbal approval - Call back: [phone]

## 2024-04-23 NOTE — Telephone Encounter (Signed)
 PA request has been Denied. New Encounter has been or will be created for follow up. For additional info see Pharmacy Prior Auth telephone encounter from 04/12/24.

## 2024-04-23 NOTE — Telephone Encounter (Signed)
 Message sent to Sarah who handles Prolia  approvals

## 2024-04-25 ENCOUNTER — Telehealth: Payer: Self-pay | Admitting: Gastroenterology

## 2024-04-25 NOTE — Telephone Encounter (Signed)
 Good afternoon Dr. Legrand, This patient called and stated that she is having to cancel her procedure due to her stating a new job on August the 5 th. Patient stated that she will call back to reschedule. Please advise.

## 2024-04-25 NOTE — Telephone Encounter (Signed)
 This is her third reschedule.  When she calls back to reschedule, she needs to be as certain as possible it is a date she can do.  - HD

## 2024-05-02 ENCOUNTER — Encounter: Admitting: Gastroenterology

## 2024-06-17 ENCOUNTER — Ambulatory Visit: Admitting: Family Medicine

## 2024-06-20 ENCOUNTER — Encounter: Payer: BC Managed Care – PPO | Admitting: Family Medicine

## 2024-06-29 ENCOUNTER — Other Ambulatory Visit: Payer: Self-pay | Admitting: Family Medicine

## 2024-07-14 ENCOUNTER — Other Ambulatory Visit: Payer: Self-pay | Admitting: Family Medicine

## 2024-07-15 MED ORDER — ALENDRONATE SODIUM 70 MG PO TABS: 70.0000 mg | ORAL_TABLET | ORAL | 3 refills | Status: AC

## 2024-07-15 NOTE — Addendum Note (Signed)
 Addended by: JOHNNY SENIOR A on: 07/15/2024 12:42 PM   Modules accepted: Orders

## 2024-07-15 NOTE — Telephone Encounter (Signed)
 Done

## 2024-08-01 ENCOUNTER — Ambulatory Visit: Payer: Self-pay

## 2024-08-01 NOTE — Telephone Encounter (Signed)
 Noted

## 2024-08-01 NOTE — Telephone Encounter (Signed)
 FYI Only or Action Required?: FYI only for provider: appointment scheduled on 08/02/2024.  Patient was last seen in primary care on 04/10/2024 by Johnny Garnette LABOR, MD.  Called Nurse Triage reporting Facial Pain.  Symptoms began several days ago.  Interventions attempted: OTC medications:  SABRA  Symptoms are: gradually worsening.  Triage Disposition: See Physician Within 24 Hours  Patient/caregiver understands and will follow disposition?: Yes        Copied from CRM #[id number]. Topic: Clinical - Red Word Triage >> Aug 01, 2024 10:14 AM Harlene ORN wrote: Red Word that prompted transfer to Nurse Triage: sinus infection for less than a week (getting worse) pain in upper sinuses pressure, and drainage. Reason for Disposition  [1] Sinus pain (not just congestion) AND [2] fever  Answer Assessment - Initial Assessment Questions 1. LOCATION: Where does it hurt?      Upper sinus area  2. ONSET: When did the sinus pain start?  (e.g., hours, days)      6 days  3. SEVERITY: How bad is the pain?   (Scale 0-10; or none, mild, moderate or severe)     Moderate  4. RECURRENT SYMPTOM: Have you ever had sinus problems before? If Yes, ask: When was the last time? and What happened that time?      Yes  5. NASAL CONGESTION: Is the nose blocked? If Yes, ask: Can you open it or must you breathe through your mouth?     Yes 6. NASAL DISCHARGE: Do you have discharge from your nose? If so ask, What color?     Denies  7. FEVER: Do you have a fever? If Yes, ask: What is it, how was it measured, and when did it start?      Denies  8. OTHER SYMPTOMS: Do you have any other symptoms? (e.g., sore throat, cough, earache, difficulty breathing)    Dry Cough  Using otc meds for symptoms.  Protocols used: Sinus Pain or Congestion-A-AH

## 2024-08-02 ENCOUNTER — Telehealth (INDEPENDENT_AMBULATORY_CARE_PROVIDER_SITE_OTHER): Admitting: Family Medicine

## 2024-08-02 ENCOUNTER — Encounter: Payer: Self-pay | Admitting: Family Medicine

## 2024-08-02 ENCOUNTER — Ambulatory Visit: Admitting: Adult Health

## 2024-08-02 DIAGNOSIS — J0100 Acute maxillary sinusitis, unspecified: Secondary | ICD-10-CM | POA: Diagnosis not present

## 2024-08-02 MED ORDER — AMOXICILLIN-POT CLAVULANATE 875-125 MG PO TABS: 1.0000 | ORAL_TABLET | Freq: Two times a day (BID) | ORAL | 0 refills | Status: DC

## 2024-08-02 NOTE — Progress Notes (Signed)
 Patient ID: [name], female   DOB: 12-16-1965, 58 y.o.   MRN: [id number]   Virtual Visit via Video Note  I connected with [name] on 08/02/24 at 10:00 AM EST by a video enabled telemedicine application and verified that I am speaking with the correct person using two identifiers.  Location patient: home Location provider:work or home office Persons participating in the virtual visit: patient, provider  I discussed the limitations of evaluation and management by telemedicine and the availability of in person appointments. The patient expressed understanding and agreed to proceed.   HPI: Tracy is seen with concern for sinusitis.  She has had over 10 days of some nasal congestion and increasing facial pain maxillary sinus region along with upper teeth pain.  Frequent postnasal drip which is triggering some cough.  She has had some malaise and occasional headaches.  No documented fever.  She does have history of asthma and is on Singulair  and regular inhaler and is concerned that her asthma may worsen if not addressing her sinusitis.  She has had recurrent sinusitis in the past.  Does have history of intolerance/allergy with Zithromax.  She has done best with Augmentin  in the past.   ROS: See pertinent positives and negatives per HPI.  Past Medical History:  Diagnosis Date   Allergy    Anxiety    Asthma    seasonal   Headache    Pelvic pain     Past Surgical History:  Procedure Laterality Date   ABDOMINAL EXPOSURE N/A 01/30/2019   Procedure: ABDOMINAL EXPOSURE;  Surgeon: Serene Gaile ORN, MD;  Location: [facility] OR;  Service: Vascular;  Laterality: N/A;   ANTERIOR LUMBAR FUSION N/A 01/30/2019   Procedure: Anterior Lumbar Interbody Fusion L5-S1;  Surgeon: Burnetta Aures, MD;  Location: [facility] OR;  Service: Orthopedics;  Laterality: N/A;    CERVICAL BIOPSY  W/ LOOP ELECTRODE EXCISION     NOV 2014   CHOLECYSTECTOMY     LAPAROSCOPIC SALPINGO OOPHERECTOMY Bilateral  03/16/2017   Procedure: LAPAROSCOPIC SALPINGO OOPHORECTOMY;  Surgeon: Rockney Evalene SQUIBB, MD;  Location: [facility];  Service: Gynecology;  Laterality: Bilateral;   OPEN REDUCTION INTERNAL FIXATION (ORIF) DISTAL RADIAL FRACTURE Right 05/02/2018   Procedure: OPEN REDUCTION INTERNAL FIXATION (ORIF) DISTAL RADIAL FRACTURE;  Surgeon: Shari Easter, MD;  Location: [facility] OR;  Service: Orthopedics;  Laterality: Right;   PELVIC LAPAROSCOPY      Family History  Problem Relation Age of Onset   Hypertension Mother    Anxiety disorder Mother    Depression Mother    ADD / ADHD Father    Breast cancer Other        GREAT AUNT   Colon cancer Neg Hx    Colon polyps Neg Hx    Esophageal cancer Neg Hx    Rectal cancer Neg Hx    Stomach cancer Neg Hx     SOCIAL HX: Non-smoker   Current Outpatient Medications:    albuterol  (VENTOLIN  HFA) 108 (90 Base) MCG/ACT inhaler, INHALE 2 PUFFS INTO THE LUNGS EVERY 4 HOURS AS NEEDED FOR WHEEZING OR SHORTNESS OF BREATH., Disp: 6.7 each, Rfl: 11   alendronate  (FOSAMAX ) 70 MG tablet, Take 1 tablet (70 mg total) by mouth once a week. Take with a full glass of water on an empty stomach., Disp: 12 tablet, Rfl: 3   amoxicillin -clavulanate (AUGMENTIN ) 875-125 MG tablet, Take 1 tablet by mouth 2 (two) times daily., Disp: 20 tablet, Rfl: 0   buPROPion  (WELLBUTRIN  XL) 150  MG 24 hr tablet, Take 1 tablet (150 mg total) by mouth daily., Disp: 30 tablet, Rfl: 2   diazepam  (VALIUM ) 5 MG tablet, Take 1 tablet (5 mg total) by mouth every 8 (eight) hours as needed for anxiety., Disp: 90 tablet, Rfl: 5   fluconazole  (DIFLUCAN ) 150 MG tablet, Take 1 tablet (150 mg total) by mouth daily., Disp: 1 tablet, Rfl: 5   Fluticasone  Propionate, Inhal, (FLOVENT  DISKUS) 100 MCG/ACT AEPB, Inhale into the lungs., Disp: , Rfl:    hydrOXYzine  (ATARAX ) 10 MG tablet, TAKE 1 TABLET BY MOUTH 3 TIMES DAILY AS NEEDED FOR ANXIETY., Disp: 270 tablet, Rfl: 1   ibuprofen  (ADVIL ) 800 MG tablet,  TAKE 1 TABLET BY MOUTH EVERY 8 HOURS AS NEEDED FOR MODERATE PAIN. **NEED NEW INSURANCE**, Disp: 90 tablet, Rfl: 1   montelukast  (SINGULAIR ) 10 MG tablet, TAKE 1 TABLET BY MOUTH EVERYDAY AT BEDTIME, Disp: 90 tablet, Rfl: 1   ondansetron  (ZOFRAN ) 4 MG tablet, TAKE 2 TABLET(S) EVERY 8 HOURS BY ORAL ROUTE AS NEEDED FOR 5 DAYS., Disp: , Rfl:    promethazine  (PHENERGAN ) 25 MG tablet, TAKE 1 TABLET BY MOUTH EVERY 6 HOURS AS NEEDED FOR NAUSEA OR VOMITING., Disp: 60 tablet, Rfl: 5   rizatriptan  (MAXALT -MLT) 10 MG disintegrating tablet, Take 1 tablet (10 mg total) by mouth as needed for migraine. May repeat in 2 hours if needed, Disp: 12 tablet, Rfl: 11   valACYclovir  (VALTREX ) 500 MG tablet, TAKE 1 TABLET BY MOUTH TWICE A DAY, Disp: 180 tablet, Rfl: 3  EXAM:  VITALS per patient if applicable:  GENERAL: alert, oriented, appears well and in no acute distress  HEENT: atraumatic, conjunttiva clear, no obvious abnormalities on inspection of external nose and ears  NECK: normal movements of the head and neck  LUNGS: on inspection no signs of respiratory distress, breathing rate appears normal, no obvious gross SOB, gasping or wheezing  CV: no obvious cyanosis  MS: moves all visible extremities without noticeable abnormality  PSYCH/NEURO: pleasant and cooperative, no obvious depression or anxiety, speech and thought processing grossly intact  ASSESSMENT AND PLAN:  Discussed the following assessment and plan:  Acute sinusitis, maxillary.  Start Augmentin  875 mg twice daily for 10 days.  Plenty of fluids.  Patient has done nasal saline irrigation in the past.  Consider humidifier at night.  Follow-up for any persistent or worsening symptoms     I discussed the assessment and treatment plan with the patient. The patient was provided an opportunity to ask questions and all were answered. The patient agreed with the plan and demonstrated an understanding of the instructions.   The patient was advised  to call back or seek an in-person evaluation if the symptoms worsen or if the condition fails to improve as anticipated.     Wolm Scarlet, MD

## 2024-09-05 ENCOUNTER — Ambulatory Visit: Payer: Self-pay

## 2024-09-05 NOTE — Telephone Encounter (Signed)
 FYI Only or Action Required?: FYI only for provider: appointment scheduled on 08/30/24.  Patient was last seen in primary care on 08/02/2024 by Micheal Wolm ORN, MD.  Called Nurse Triage reporting Anxiety and Insomnia.  Symptoms began several days ago.  Interventions attempted: Prescription medications: lorazepam.  Symptoms are: unchanged.  Triage Disposition: See PCP When Office is Open (Within 3 Days)  Patient/caregiver understands and will follow disposition?: Yes   Copied from CRM #[id number]. Topic: Clinical - Red Word Triage >> Sep 05, 2024 11:09 AM Ahlexyia S wrote: [facility] that prompted transfer to Nurse Triage: Pt called in stating that she is having bad anxiety from being recently laid off. Pt also mentioned having some insomnia. Warm transferred to nurse triage. Reason for Disposition  MODERATE anxiety (e.g., persistent or frequent anxiety symptoms; interferes with sleep, school, or work)  Answer Assessment - Initial Assessment Questions Scheduled appt 09/09/24.   Offered BH resources, patient declines at this time.  Advised call back or ED/911 if symptoms worsen. Patient verbalized understanding.  1. CONCERN: Did anything happen that prompted you to call today?      Been unemployed 9 months, a lot going on, stress 2. ANXIETY SYMPTOMS: Can you describe how you (your loved one; patient) have been feeling? (e.g., tense, restless, panicky, anxious, keyed up, overwhelmed, sense of impending doom).      Anxiety getting worse 3. ONSET: How long have you been feeling this way? (e.g., hours, days, weeks)     3 days ago 4. SEVERITY: How would you rate the level of anxiety? (e.g., 0 - 10; or mild, moderate, severe).     moderate 5. FUNCTIONAL IMPAIRMENT: How have these feelings affected your ability to do daily activities? Have you had more difficulty than usual doing your normal daily activities? (e.g., getting better, same, worse; self-care, school, work,  interactions)     Able to yes 6. HISTORY: Have you felt this way before? Have you ever been diagnosed with an anxiety problem in the past? (e.g., generalized anxiety disorder, panic attacks, PTSD). If Yes, ask: How was this problem treated? (e.g., medicines, counseling, etc.)     anxiety 7. RISK OF HARM - SUICIDAL IDEATION: Do you ever have thoughts of hurting or killing yourself? If Yes, ask:  Do you have these feelings now? Do you have a plan on how you would do this?     no 8. TREATMENT:  What has been done so far to treat this anxiety? (e.g., medicines, relaxation strategies). What has helped?     Hydroxine, valium  9. THERAPIST: Do you have a counselor or therapist? If Yes, ask: What is their name?  Yes; [facility] 11. PATIENT SUPPORT: Who is with you now? Who do you live with? Do you have family or friends who you can talk to?        yes 12. OTHER SYMPTOMS: Do you have any other symptoms? (e.g., feeling depressed, trouble concentrating, trouble sleeping, trouble breathing, palpitations or fast heartbeat, chest pain, sweating, nausea, or diarrhea) denies  Protocols used: Anxiety and Panic Attack-A-AH

## 2024-09-09 ENCOUNTER — Ambulatory Visit: Admitting: Family Medicine

## 2024-09-09 ENCOUNTER — Encounter: Payer: Self-pay | Admitting: Family Medicine

## 2024-09-09 ENCOUNTER — Telehealth: Payer: Self-pay | Admitting: *Deleted

## 2024-09-09 VITALS — BP 100/66 | HR 94 | Temp 98.7°F | Ht 66.0 in | Wt 131.0 lb

## 2024-09-09 DIAGNOSIS — G4700 Insomnia, unspecified: Secondary | ICD-10-CM | POA: Diagnosis not present

## 2024-09-09 DIAGNOSIS — F32A Depression, unspecified: Secondary | ICD-10-CM | POA: Diagnosis not present

## 2024-09-09 DIAGNOSIS — F419 Anxiety disorder, unspecified: Secondary | ICD-10-CM | POA: Diagnosis not present

## 2024-09-09 MED ORDER — QUETIAPINE FUMARATE 25 MG PO TABS: 25.0000 mg | ORAL_TABLET | Freq: Every day | ORAL | 2 refills | Status: DC

## 2024-09-09 NOTE — Progress Notes (Signed)
° °  Subjective:    Patient ID: [name], female    DOB: [birth year], 58 y.o.   MRN: [id number]  HPI Here asking for help with her anxiety. She spends some time telling me about her struggles with finding a job, with paying her bills, and with caring for her mother. She is convinced that someone is sabotaging her efforts to find work by telling lies about her to whatever company she interviews with. She also says this person is able to get into her cell phone, and then they spy on her conversations and they block incoming call. She has worked with her cell phone company, she has changed phones and changed cell numbers, but some how this person still gets into them. She has been very anxious and she has trouble sleeping. She has reached out to a law firm to help her, but she is still waiting to hear back from them. She has been on Wellbutrin  and Valium  for several years, and she wants to maintain these at the current dosages.    Review of Systems  Constitutional: Negative.   Respiratory: Negative.    Cardiovascular: Negative.   Psychiatric/Behavioral:  Positive for decreased concentration, dysphoric mood and sleep disturbance. Negative for agitation, behavioral problems and confusion. The patient is nervous/anxious.        Objective:   Physical Exam Constitutional:      Appearance: Normal appearance.  Cardiovascular:     Rate and Rhythm: Normal rate and regular rhythm.     Pulses: Normal pulses.     Heart sounds: Normal heart sounds.  Pulmonary:     Effort: Pulmonary effort is normal.     Breath sounds: Normal breath sounds.  Neurological:     Mental Status: She is alert.  Psychiatric:        Behavior: Behavior normal.     Comments: She is anxious and tearful. A lot of what she tells me today is quite paranoid in nature            Assessment & Plan:  She is struggling with anxiety and insomnia. We will keep the Wellbutrin  and the Valium  as is, and we will add Quetiapine   25 mg at bedtime to help her rest at night. Follow up as needed.  Garnette Olmsted, MD

## 2024-09-09 NOTE — Telephone Encounter (Signed)
 Copied from CRM #[id number]. Topic: Clinical - Prescription Issue >> Sep 09, 2024  4:15 PM Roselie BROCKS wrote: Reason for CRM: Patient has questions about medications mentioned in last visit on 09-09-24, and patient states she has had side effects in the past with the medication and needs to go over some things. Please return call to patient.

## 2024-09-10 ENCOUNTER — Telehealth: Payer: Self-pay

## 2024-09-10 ENCOUNTER — Other Ambulatory Visit (HOSPITAL_COMMUNITY): Payer: Self-pay

## 2024-09-10 NOTE — Telephone Encounter (Signed)
 Pharmacy Patient Advocate Encounter  Received notification from [employer] that Prior Authorization for Quetiapine  fumarate 25 has been APPROVED from 09/10/24 to 09/10/25. Unable to obtain price due to refill too soon rejection, last fill date 09/10/24 next available fill date1/8/26   PA #/Case ID/Reference #: # [id number]

## 2024-09-10 NOTE — Telephone Encounter (Signed)
 FYI Spoke with states that she is choosing not to start taking the Quetiapine  due to previously trying this medication and pt states that it gave her weird dreams. Pt states that she will try her best to cope with her problems without the Rx and if things get worse pt will call to schedule appointment with Dr Johnny.

## 2024-09-10 NOTE — Telephone Encounter (Signed)
 I am glad to hear things are getting better. Let's stay off sleep medications for now and see how things go

## 2024-09-10 NOTE — Telephone Encounter (Signed)
 Noted

## 2024-09-10 NOTE — Telephone Encounter (Signed)
 Pharmacy Patient Advocate Encounter   Received notification from [employer]base that prior authorization for Quetiapine  Fumarate 25 tabs is required/requested.   Insurance verification completed.   The patient is insured through [employer].   Per test claim: PA required; PA submitted to above mentioned insurance via Latent Key/confirmation #/[id number] Status is pending

## 2024-09-22 ENCOUNTER — Other Ambulatory Visit: Payer: Self-pay | Admitting: Family Medicine

## 2024-09-23 ENCOUNTER — Encounter: Payer: Self-pay | Admitting: Family Medicine

## 2024-09-25 NOTE — Telephone Encounter (Signed)
 FYI

## 2024-10-04 ENCOUNTER — Telehealth: Admitting: Family Medicine

## 2024-10-04 ENCOUNTER — Encounter: Payer: Self-pay | Admitting: Family Medicine

## 2024-10-04 VITALS — Ht 66.0 in

## 2024-10-04 DIAGNOSIS — J4520 Mild intermittent asthma, uncomplicated: Secondary | ICD-10-CM

## 2024-10-04 DIAGNOSIS — J0100 Acute maxillary sinusitis, unspecified: Secondary | ICD-10-CM

## 2024-10-04 DIAGNOSIS — J069 Acute upper respiratory infection, unspecified: Secondary | ICD-10-CM

## 2024-10-04 MED ORDER — PREDNISONE 20 MG PO TABS: 40.0000 mg | ORAL_TABLET | Freq: Every day | ORAL | 0 refills | Status: AC

## 2024-10-04 MED ORDER — AMOXICILLIN-POT CLAVULANATE 875-125 MG PO TABS: 1.0000 | ORAL_TABLET | Freq: Two times a day (BID) | ORAL | 0 refills | Status: AC

## 2024-10-04 NOTE — Progress Notes (Signed)
 Virtual Visit via Video Note I connected with [name] on 10/04/2024 by a video enabled telemedicine application and verified that I am speaking with the correct person using two identifiers. Location patient: home Location provider:work office Persons participating in the virtual visit: patient, provider  I discussed the limitations of evaluation and management by telemedicine and the availability of in person appointments. The patient expressed understanding and agreed to proceed.  Chief Complaint  Patient presents with   Sinus Problem    X a week    Discussed the use of AI scribe software for clinical note transcription with the patient, who gave verbal consent to proceed. History of Present Illness [name] is a 59 year old female with a past medical history significant for asthma, migraine headaches, anxiety/depression, and seasonal allergies,being seen on video today with sinus pressure and congestion.  She has been experiencing maxillary sinus pressure and congestion for the past week, accompanied by facial pain, rhinorrhea, and postnasal drip. These symptoms have led to a scratchy voice and occasional cough. Over-the-counter medications have become ineffective, and the pain and pressure sometimes disrupt her sleep.  She denies flu-like symptoms such as chills or body aches but mentions a mild fever, though she did not measure her temperature.  She has not been exposed to anyone sick and has not tested for flu or COVID-19 at home.  For symptom management, she has been taking Mucinex DM for cough, Sudafed for congestion, and Tylenol  for pain and mild fever.  + Fatigue. She also uses a saline spray and a neti pot for nasal irrigation.  Her asthma flared up slightly yesterday, when she had mild wheezing.  Negative for dyspnea and she has not had wheezing today. She took Singulair  in the morning and did not feel the need to use her albuterol  inhaler  again. She also takes Flovent  100 mcg daily as needed.  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  Negative for sore throat, CP, abdominal pain, nausea, vomiting, changes in bowel habits, urinary symptoms, or skin rash.  ROS: See pertinent positives and negatives per HPI.  Past Medical History:  Diagnosis Date   Allergy    Anxiety    Asthma    seasonal   Headache    Pelvic pain     Past Surgical History:  Procedure Laterality Date   ABDOMINAL EXPOSURE N/A 01/30/2019   Procedure: ABDOMINAL EXPOSURE;  Surgeon: Serene Gaile ORN, MD;  Location: [facility] OR;  Service: Vascular;  Laterality: N/A;   ANTERIOR LUMBAR FUSION N/A 01/30/2019   Procedure: Anterior Lumbar Interbody Fusion L5-S1;  Surgeon: Burnetta Aures, MD;  Location: [facility] OR;  Service: Orthopedics;  Laterality: N/A;    CERVICAL BIOPSY  W/ LOOP ELECTRODE EXCISION     NOV 2014   CHOLECYSTECTOMY     LAPAROSCOPIC SALPINGO OOPHERECTOMY Bilateral 03/16/2017   Procedure: LAPAROSCOPIC SALPINGO OOPHORECTOMY;  Surgeon: Rockney Evalene SQUIBB, MD;  Location: [facility];  Service: Gynecology;  Laterality: Bilateral;   OPEN REDUCTION INTERNAL FIXATION (ORIF) DISTAL RADIAL FRACTURE Right 05/02/2018   Procedure: OPEN REDUCTION INTERNAL FIXATION (ORIF) DISTAL RADIAL FRACTURE;  Surgeon: Shari Easter, MD;  Location: [facility] OR;  Service: Orthopedics;  Laterality: Right;   PELVIC LAPAROSCOPY      Family History  Problem Relation Age of Onset   Hypertension Mother    Anxiety disorder Mother    Depression Mother  ADD / ADHD Father    Breast cancer Other        GREAT AUNT   Colon cancer Neg Hx    Colon polyps Neg Hx    Esophageal cancer Neg Hx    Rectal cancer Neg Hx    Stomach cancer Neg Hx     Social History   Socioeconomic History   Marital status: Divorced    Spouse name: Not on file   Number of children: Not on file   Years of  education: Not on file   Highest education level: Associate degree: academic program  Occupational History   Not on file  Tobacco Use   Smoking status: Never   Smokeless tobacco: Never  Vaping Use   Vaping status: Never Used  Substance and Sexual Activity   Alcohol use: Not Currently    Alcohol/week: 1.0 standard drink of alcohol    Types: 1 Glasses of wine per week   Drug use: No   Sexual activity: Not Currently    Birth control/protection: Post-menopausal  Other Topics Concern   Not on file  Social History Narrative   Not on file   Social Drivers of Health   Tobacco Use: Low Risk (10/04/2024)   Patient History    Smoking Tobacco Use: Never    Smokeless Tobacco Use: Never    Passive Exposure: Not on file  Financial Resource Strain: Medium Risk (10/03/2024)   Overall Financial Resource Strain (CARDIA)    Difficulty of Paying Living Expenses: Somewhat hard  Food Insecurity: Food Insecurity Present (10/03/2024)   Epic    Worried About Programme Researcher, Broadcasting/film/video in the Last Year: Often true    Ran Out of Food in the Last Year: Often true  Transportation Needs: No Transportation Needs (10/03/2024)   Epic    Lack of Transportation (Medical): No    Lack of Transportation (Non-Medical): No  Physical Activity: Insufficiently Active (10/03/2024)   Exercise Vital Sign    Days of Exercise per Week: 3 days    Minutes of Exercise per Session: 40 min  Stress: Stress Concern Present (10/03/2024)   [employer] Occupational [employer] - Occupational Stress Questionnaire    Feeling of Stress: Rather much  Social Connections: Moderately Integrated (10/03/2024)   Social Connection and Isolation Panel    Frequency of Communication with Friends and Family: More than three times a week    Frequency of Social Gatherings with Friends and Family: Three times a week    Attends Religious Services: More than 4 times per year    Active Member of Clubs or Organizations: Yes    Attends Banker  Meetings: More than 4 times per year    Marital Status: Divorced  Intimate Partner Violence: Unknown (12/29/2021)   Received from [facility]   HITS    Physically Hurt: Not on file    Insult or Talk Down To: Not on file    Threaten Physical Harm: Not on file    Scream or Curse: Not on file  Depression (PHQ2-9): Medium Risk (09/09/2024)   Depression (PHQ2-9)    PHQ-2 Score: 10  Alcohol Screen: Low Risk (04/01/2024)   Alcohol Screen    Last Alcohol Screening Score (AUDIT): 0  Housing: High Risk (10/03/2024)   Epic    Unable to Pay for Housing in the Last Year: Yes    Number of Times Moved in the Last Year: 0    Homeless in the Last Year: No  Utilities: Not on  file  Health Literacy: Not on file    Current Medications[1]  EXAM:  VITALS per patient if applicable:Ht 5' 6 (1.676 m)   LMP 02/19/2009   BMI 21.14 kg/m   GENERAL: alert, oriented, appears well and in no acute distress  HEENT: atraumatic, conjunctiva clear, no obvious abnormalities on inspection of external nose and ears  NECK: normal movements of the head and neck  LUNGS: on inspection no signs of respiratory distress, breathing rate appears normal, no obvious gross SOB, gasping or wheezing  CV: no obvious cyanosis  MS: moves all visible extremities without noticeable abnormality  PSYCH/NEURO: pleasant and cooperative, no obvious depression or anxiety, speech and thought processing grossly intact  ASSESSMENT AND PLAN:  Discussed the following assessment and plan:  URI, acute Symptoms suggests a viral etiology, I explained patient that symptomatic treatment is usually recommended in this case, so I do not think abx is needed at this time. Instructed to monitor for signs of complications, including recurrence of fever among some, instructed about warning signs. I also explained that cough and nasal congestion can last a few days and sometimes weeks. F/U as needed.  Acute non-recurrent maxillary  sinusitis Maxillary pressure pain. Explained that it is most likely viral, antibiotic treatment is not recommended at this time. Instructed to continue Flonase  nasal spray at bedtime for 10 to 14 days and nasal saline irrigations. If in 4 to 5 days she is not feeling any improvement, she can restart antibiotic treatment. Follow-up with PCP if symptoms are persistent.  -     Amoxicillin -Pot Clavulanate; Take 1 tablet by mouth 2 (two) times daily for 7 days.  Dispense: 14 tablet; Refill: 0 -     predniSONE ; Take 2 tablets (40 mg total) by mouth daily with breakfast for 3 days.  Dispense: 6 tablet; Refill: 0  Intermittent asthma without complication, unspecified asthma severity She has some wheezing yesterday, she has not had symptoms today. Sent prescription for prednisone  to take 40 mg with breakfast for 3 days if wheezing reoccurs. Continue albuterol  inhaler 1 to 2 puff every 4-6 hours as needed. Currently she is on Flovent  100 mcg daily as needed. Continue follow-up with PCP.  -     predniSONE ; Take 2 tablets (40 mg total) by mouth daily with breakfast for 3 days.  Dispense: 6 tablet; Refill: 0  We discussed possible serious and likely etiologies, options for evaluation and workup, limitations of telemedicine visit vs in person visit, treatment, treatment risks and precautions. The patient was advised to call back or seek an in-person evaluation if the symptoms worsen or if the condition fails to improve as anticipated. I discussed the assessment and treatment plan with the patient. The patient was provided an opportunity to ask questions and all were answered. The patient agreed with the plan and demonstrated an understanding of the instructions.  Return if symptoms worsen or fail to improve.  Aiko Belko, MD      [1]  Current Outpatient Medications:    albuterol  (VENTOLIN  HFA) 108 (90 Base) MCG/ACT inhaler, INHALE 2 PUFFS INTO THE LUNGS EVERY 4 HOURS AS NEEDED FOR WHEEZING OR  SHORTNESS OF BREATH., Disp: 6.7 each, Rfl: 11   alendronate  (FOSAMAX ) 70 MG tablet, Take 1 tablet (70 mg total) by mouth once a week. Take with a full glass of water on an empty stomach. (Patient taking differently: Take 70 mg by mouth once a week. Take with a full glass of water on an empty stomach.  As needed),  Disp: 12 tablet, Rfl: 3   amoxicillin -clavulanate (AUGMENTIN ) 875-125 MG tablet, Take 1 tablet by mouth 2 (two) times daily for 7 days., Disp: 14 tablet, Rfl: 0   buPROPion  (WELLBUTRIN  XL) 150 MG 24 hr tablet, Take 1 tablet (150 mg total) by mouth daily. (Patient taking differently: Take 150 mg by mouth daily. As needed), Disp: 30 tablet, Rfl: 2   diazepam  (VALIUM ) 5 MG tablet, Take 1 tablet (5 mg total) by mouth every 8 (eight) hours as needed for anxiety., Disp: 90 tablet, Rfl: 5   Fluticasone  Propionate, Inhal, (FLOVENT  DISKUS) 100 MCG/ACT AEPB, Inhale into the lungs. (Patient taking differently: Inhale into the lungs. As needed), Disp: , Rfl:    hydrOXYzine  (ATARAX ) 10 MG tablet, TAKE 1 TABLET BY MOUTH 3 TIMES DAILY AS NEEDED FOR ANXIETY., Disp: 270 tablet, Rfl: 1   ibuprofen  (ADVIL ) 800 MG tablet, TAKE 1 TABLET BY MOUTH EVERY 8 HOURS AS NEEDED FOR MODERATE PAIN. **NEED NEW INSURANCE** (Patient taking differently: As needed), Disp: 90 tablet, Rfl: 1   montelukast  (SINGULAIR ) 10 MG tablet, TAKE 1 TABLET BY MOUTH EVERYDAY AT BEDTIME, Disp: 90 tablet, Rfl: 1   ondansetron  (ZOFRAN ) 4 MG tablet, TAKE 2 TABLET(S) EVERY 8 HOURS BY ORAL ROUTE AS NEEDED FOR 5 DAYS. (Patient taking differently: As needed), Disp: , Rfl:    predniSONE  (DELTASONE ) 20 MG tablet, Take 2 tablets (40 mg total) by mouth daily with breakfast for 3 days., Disp: 6 tablet, Rfl: 0   promethazine  (PHENERGAN ) 25 MG tablet, TAKE 1 TABLET BY MOUTH EVERY 6 HOURS AS NEEDED FOR NAUSEA OR VOMITING., Disp: 60 tablet, Rfl: 5   rizatriptan  (MAXALT -MLT) 10 MG disintegrating tablet, Take 1 tablet (10 mg total) by mouth as needed for migraine.  May repeat in 2 hours if needed, Disp: 12 tablet, Rfl: 11   valACYclovir  (VALTREX ) 500 MG tablet, TAKE 1 TABLET BY MOUTH TWICE A DAY (Patient taking differently: As needed), Disp: 180 tablet, Rfl: 3   fluconazole  (DIFLUCAN ) 150 MG tablet, Take 1 tablet (150 mg total) by mouth daily. (Patient not taking: Reported on 10/04/2024), Disp: 1 tablet, Rfl: 5

## 2024-10-16 ENCOUNTER — Other Ambulatory Visit: Payer: Self-pay | Admitting: Family Medicine

## 2024-10-18 ENCOUNTER — Ambulatory Visit: Admitting: Family Medicine

## 2024-10-21 ENCOUNTER — Ambulatory Visit: Admitting: Family Medicine

## 2024-10-25 ENCOUNTER — Other Ambulatory Visit: Payer: Self-pay | Admitting: Family Medicine

## 2024-10-25 ENCOUNTER — Ambulatory Visit: Admitting: Family Medicine

## 2024-10-25 ENCOUNTER — Telehealth: Admitting: Family Medicine

## 2024-10-25 ENCOUNTER — Encounter: Payer: Self-pay | Admitting: Family Medicine

## 2024-10-25 VITALS — Temp 99.5°F

## 2024-10-25 DIAGNOSIS — J0191 Acute recurrent sinusitis, unspecified: Secondary | ICD-10-CM

## 2024-10-25 DIAGNOSIS — F419 Anxiety disorder, unspecified: Secondary | ICD-10-CM

## 2024-10-25 MED ORDER — DOXYCYCLINE HYCLATE 100 MG PO TABS: 100.0000 mg | ORAL_TABLET | Freq: Two times a day (BID) | ORAL | 0 refills | Status: AC
